# Patient Record
Sex: Male | Born: 1965 | Race: Asian | Hispanic: No | Marital: Married | Smoking: Never smoker
Health system: Southern US, Community
[De-identification: ages and names within clinical notes are randomized; demographics above are authoritative.]

## PROBLEM LIST (undated history)

## (undated) DIAGNOSIS — E039 Hypothyroidism, unspecified: Secondary | ICD-10-CM

## (undated) DIAGNOSIS — R7303 Prediabetes: Secondary | ICD-10-CM

## (undated) DIAGNOSIS — G4730 Sleep apnea, unspecified: Secondary | ICD-10-CM

## (undated) DIAGNOSIS — K219 Gastro-esophageal reflux disease without esophagitis: Secondary | ICD-10-CM

## (undated) DIAGNOSIS — I10 Essential (primary) hypertension: Secondary | ICD-10-CM

## (undated) DIAGNOSIS — F32A Depression, unspecified: Secondary | ICD-10-CM

## (undated) DIAGNOSIS — F419 Anxiety disorder, unspecified: Secondary | ICD-10-CM

## (undated) DIAGNOSIS — N189 Chronic kidney disease, unspecified: Secondary | ICD-10-CM

## (undated) DIAGNOSIS — M109 Gout, unspecified: Secondary | ICD-10-CM

## (undated) DIAGNOSIS — D649 Anemia, unspecified: Secondary | ICD-10-CM

## (undated) DIAGNOSIS — M7542 Impingement syndrome of left shoulder: Principal | ICD-10-CM

## (undated) DIAGNOSIS — M1990 Unspecified osteoarthritis, unspecified site: Secondary | ICD-10-CM

## (undated) DIAGNOSIS — M19072 Primary osteoarthritis, left ankle and foot: Secondary | ICD-10-CM

## (undated) DIAGNOSIS — R011 Cardiac murmur, unspecified: Secondary | ICD-10-CM

## (undated) DIAGNOSIS — M19071 Primary osteoarthritis, right ankle and foot: Secondary | ICD-10-CM

## (undated) DIAGNOSIS — M2240 Chondromalacia patellae, unspecified knee: Secondary | ICD-10-CM

## (undated) DIAGNOSIS — J189 Pneumonia, unspecified organism: Secondary | ICD-10-CM

## (undated) DIAGNOSIS — L309 Dermatitis, unspecified: Secondary | ICD-10-CM

## (undated) DIAGNOSIS — M170 Bilateral primary osteoarthritis of knee: Secondary | ICD-10-CM

## (undated) DIAGNOSIS — N028 Recurrent and persistent hematuria with other morphologic changes: Secondary | ICD-10-CM

## (undated) HISTORY — DX: Gout, unspecified: M10.9

## (undated) HISTORY — DX: Chronic kidney disease, unspecified: N18.9

## (undated) HISTORY — DX: Primary osteoarthritis, right ankle and foot: M19.071

## (undated) HISTORY — PX: HEMORRHOID SURGERY: SHX153

## (undated) HISTORY — DX: Essential (primary) hypertension: I10

## (undated) HISTORY — DX: Bilateral primary osteoarthritis of knee: M17.0

## (undated) HISTORY — DX: Impingement syndrome of left shoulder: M75.42

## (undated) HISTORY — DX: Primary osteoarthritis, left ankle and foot: M19.072

## (undated) HISTORY — PX: KIDNEY TRANSPLANT: SHX239

## (undated) HISTORY — DX: Chondromalacia patellae, unspecified knee: M22.40

## (undated) HISTORY — PX: SHOULDER SURGERY: SHX246

## (undated) HISTORY — DX: Recurrent and persistent hematuria with other morphologic changes: N02.8

## (undated) HISTORY — PX: CATARACT EXTRACTION: SUR2

## (undated) HISTORY — PX: RENAL BIOPSY: SHX156

## (undated) HISTORY — PX: UPPER GI ENDOSCOPY: SHX6162

---

## 1998-12-11 ENCOUNTER — Other Ambulatory Visit: Admission: RE | Admit: 1998-12-11 | Discharge: 1998-12-11 | Payer: Self-pay | Admitting: Urology

## 1999-01-29 ENCOUNTER — Encounter: Payer: Self-pay | Admitting: Internal Medicine

## 2004-08-20 ENCOUNTER — Encounter: Admission: RE | Admit: 2004-08-20 | Discharge: 2004-08-20 | Payer: Self-pay | Admitting: Nephrology

## 2004-09-11 ENCOUNTER — Ambulatory Visit (HOSPITAL_COMMUNITY): Admission: RE | Admit: 2004-09-11 | Discharge: 2004-09-11 | Payer: Self-pay | Admitting: Nephrology

## 2004-09-13 ENCOUNTER — Ambulatory Visit (HOSPITAL_COMMUNITY): Admission: RE | Admit: 2004-09-13 | Discharge: 2004-09-14 | Payer: Self-pay | Admitting: Nephrology

## 2004-09-13 ENCOUNTER — Encounter (INDEPENDENT_AMBULATORY_CARE_PROVIDER_SITE_OTHER): Payer: Self-pay | Admitting: *Deleted

## 2005-04-29 ENCOUNTER — Encounter (HOSPITAL_COMMUNITY): Admission: RE | Admit: 2005-04-29 | Discharge: 2005-07-28 | Payer: Self-pay | Admitting: Nephrology

## 2005-08-30 ENCOUNTER — Encounter (HOSPITAL_COMMUNITY): Admission: RE | Admit: 2005-08-30 | Discharge: 2005-10-02 | Payer: Self-pay | Admitting: Nephrology

## 2006-12-08 ENCOUNTER — Encounter (HOSPITAL_COMMUNITY): Admission: RE | Admit: 2006-12-08 | Discharge: 2007-01-23 | Payer: Self-pay | Admitting: Nephrology

## 2006-12-22 ENCOUNTER — Ambulatory Visit: Payer: Self-pay | Admitting: Internal Medicine

## 2007-01-14 DIAGNOSIS — R1319 Other dysphagia: Secondary | ICD-10-CM | POA: Insufficient documentation

## 2007-01-14 DIAGNOSIS — K2940 Chronic atrophic gastritis without bleeding: Secondary | ICD-10-CM | POA: Insufficient documentation

## 2007-01-14 DIAGNOSIS — E039 Hypothyroidism, unspecified: Secondary | ICD-10-CM | POA: Insufficient documentation

## 2007-01-14 DIAGNOSIS — M109 Gout, unspecified: Secondary | ICD-10-CM | POA: Insufficient documentation

## 2007-01-14 DIAGNOSIS — N059 Unspecified nephritic syndrome with unspecified morphologic changes: Secondary | ICD-10-CM | POA: Insufficient documentation

## 2007-01-14 DIAGNOSIS — I10 Essential (primary) hypertension: Secondary | ICD-10-CM | POA: Insufficient documentation

## 2007-01-14 DIAGNOSIS — Z8711 Personal history of peptic ulcer disease: Secondary | ICD-10-CM | POA: Insufficient documentation

## 2007-01-14 DIAGNOSIS — R197 Diarrhea, unspecified: Secondary | ICD-10-CM | POA: Insufficient documentation

## 2007-01-19 ENCOUNTER — Other Ambulatory Visit: Payer: Self-pay | Admitting: Gastroenterology

## 2009-04-12 ENCOUNTER — Ambulatory Visit: Payer: Self-pay | Admitting: Vascular Surgery

## 2009-06-01 ENCOUNTER — Encounter (HOSPITAL_COMMUNITY): Admission: RE | Admit: 2009-06-01 | Discharge: 2009-08-30 | Payer: Self-pay | Admitting: Nephrology

## 2010-05-07 ENCOUNTER — Other Ambulatory Visit: Payer: Self-pay | Admitting: Orthopedic Surgery

## 2010-05-07 ENCOUNTER — Other Ambulatory Visit (HOSPITAL_COMMUNITY): Payer: Self-pay | Admitting: Orthopedic Surgery

## 2010-05-07 ENCOUNTER — Encounter (HOSPITAL_COMMUNITY): Payer: 59

## 2010-05-07 ENCOUNTER — Ambulatory Visit (HOSPITAL_COMMUNITY)
Admission: RE | Admit: 2010-05-07 | Discharge: 2010-05-07 | Disposition: A | Discharge status: Home / Self Care | Payer: 59 | Source: Ambulatory Visit | Attending: Orthopedic Surgery | Admitting: Orthopedic Surgery

## 2010-05-07 DIAGNOSIS — Z01810 Encounter for preprocedural cardiovascular examination: Secondary | ICD-10-CM | POA: Insufficient documentation

## 2010-05-07 DIAGNOSIS — Z01812 Encounter for preprocedural laboratory examination: Secondary | ICD-10-CM | POA: Insufficient documentation

## 2010-05-07 DIAGNOSIS — Z01818 Encounter for other preprocedural examination: Secondary | ICD-10-CM | POA: Insufficient documentation

## 2010-05-07 LAB — BASIC METABOLIC PANEL
BUN: 47 mg/dL — ABNORMAL HIGH (ref 6–23)
Creatinine, Ser: 3.53 mg/dL — ABNORMAL HIGH (ref 0.4–1.5)
GFR calc Af Amer: 23 mL/min — ABNORMAL LOW (ref >60)
Glucose, Bld: 124 mg/dL — ABNORMAL HIGH (ref 70–99)
Potassium: 4.3 mEq/L (ref 3.5–5.1)

## 2010-05-07 LAB — SURGICAL PCR SCREEN: Staphylococcus aureus: POSITIVE — AB

## 2010-05-07 LAB — CBC
Hemoglobin: 13.1 g/dL (ref 13.0–17.0)
RBC: 4.5 MIL/uL (ref 4.22–5.81)
RDW: 12.2 % (ref 11.5–15.5)

## 2010-05-09 ENCOUNTER — Ambulatory Visit (HOSPITAL_COMMUNITY)
Admission: RE | Admit: 2010-05-09 | Discharge: 2010-05-09 | Disposition: A | Discharge status: Home / Self Care | Payer: 59 | Source: Ambulatory Visit | Attending: Orthopedic Surgery | Admitting: Orthopedic Surgery

## 2010-05-09 ENCOUNTER — Ambulatory Visit (HOSPITAL_BASED_OUTPATIENT_CLINIC_OR_DEPARTMENT_OTHER): Admission: RE | Admit: 2010-05-09 | Payer: 59 | Source: Ambulatory Visit | Admitting: Orthopedic Surgery

## 2010-05-09 DIAGNOSIS — M67919 Unspecified disorder of synovium and tendon, unspecified shoulder: Secondary | ICD-10-CM | POA: Insufficient documentation

## 2010-05-09 DIAGNOSIS — Z01812 Encounter for preprocedural laboratory examination: Secondary | ICD-10-CM | POA: Insufficient documentation

## 2010-05-09 DIAGNOSIS — M719 Bursopathy, unspecified: Secondary | ICD-10-CM | POA: Insufficient documentation

## 2010-05-09 DIAGNOSIS — M25819 Other specified joint disorders, unspecified shoulder: Secondary | ICD-10-CM | POA: Insufficient documentation

## 2010-05-09 DIAGNOSIS — I10 Essential (primary) hypertension: Secondary | ICD-10-CM | POA: Insufficient documentation

## 2010-05-09 DIAGNOSIS — M24119 Other articular cartilage disorders, unspecified shoulder: Secondary | ICD-10-CM | POA: Insufficient documentation

## 2010-05-09 DIAGNOSIS — M9420 Chondromalacia, unspecified site: Secondary | ICD-10-CM | POA: Insufficient documentation

## 2010-05-09 LAB — GLUCOSE, CAPILLARY: Glucose-Capillary: 104 mg/dL — ABNORMAL HIGH (ref 70–99)

## 2010-05-15 NOTE — Op Note (Signed)
  NAMEBALAM, Colin Mcfarland NO.:  [id number]  MEDICAL RECORD NO.:  [id number]           PATIENT TYPE:  O  LOCATION:  DAYL                         FACILITY:  [facility]  PHYSICIAN:  Ugochukwu Chichester L. Rendall, M.D.  DATE OF BIRTH:  01-Nov-1965  DATE OF PROCEDURE:  05/09/2010 DATE OF DISCHARGE:                              OPERATIVE REPORT   PREOPERATIVE DIAGNOSIS:  Impingement syndrome, right shoulder, with frayed rotator cuff tendons and degenerative labrum.  POSTOPERATIVE DIAGNOSIS:  Impingement syndrome, right shoulder, with frayed rotator cuff tendons and degenerative labrum.  SURGICAL PROCEDURES: 1. Arthroscopic glenohumeral debridement of labrum and rotator cuff. 2. Arthroscopic subacromial decompression with bursectomy and     clavicular planing.  SURGEON:  Kieron Kantner L. Rendall, MD  ASSISTANT:  Vonita Moss. Duffy, PAC  ANESTHESIA:  General with the shoulder block.  PROCEDURE:  Under general anesthesia with the shoulder block, the patient is placed in the left lateral decubitus position on the bean bag and the right arm was suspended by the fishing pole shoulder holder with 15 pounds at first and then 10 pounds after 2-3 minutes.  Anatomical landmarks were marked out.  The glenohumeral joint is entered posteriorly.  Significant fraying is found of the labrum and subscapularis and inferior attachment of the supraspinatus at the greater tuberosity.  There was also an area of significant chondromalacia of the superior glenoid posteriorly.  Using the shaver placed in the shoulder by Wissinger rod technique, debridement of these frayed structures was performed and a local chondroplasty of the glenoid.  Once this was completed, attention was turned to the bursa.  A very thick bursa was encountered.  It was almost like a blanket covering the outside of the rotator cuff.  This was debrided with [employer] wand and shaver.  Once it was removed, the anatomical landmark of  the anterolateral acromion was marked by an 18 gauge needle.  The [facility] joint was clearly identified.  Spurs at the anterolateral acromion and over the [facility] joint were seen.  After debriding periosteum with an [employer] wand and getting a very clear picture, 6 mm bur was used to take down the spurs at the anterolateral and anterior acromion and to take down spurs along the [facility] joint.  The joint itself did not appear arthritic enough for a full distal clavicle resection, just the takedown of the spurs.  At this point with a bursectomy complete and the acromioplasty appropriate, traction was let off and it appeared sufficient and a substantial decompression of the subacromial space was complete.  The case was terminated at approximately 40 minutes.  Punctures were closed with 3-0 nylon.  Sterile bandage and arm sling were applied, and the patient returned to recovery in good condition.     Faigy Stretch L. Telford Nab, M.D.     Judie Grieve  D:  05/09/2010  T:  05/09/2010  Job:  [id number]  Electronically Signed by Oretha Caprice M.D. on 05/15/2010 01:08:11 PM

## 2010-05-29 NOTE — Procedures (Signed)
CEPHALIC VEIN MAPPING   INDICATION:  Evaluation for arteriovenous fistula placement.   HISTORY:  Renal failure.   EXAM:  The right cephalic vein is compressible.   Diameter measurements range from 0.19 to 0.41 cm.   The right basilic vein is compressible.   Diameter measurements range from 0.32 to 0.29 cm.   The left cephalic vein is compressible.   Diameter measurements range from 0.18 to 0.49 cm.   The left basilic vein is compressible with diameter measurements ranging  from 0.20 to 0.18 cm.   See attached worksheet for all measurements.   IMPRESSION:  The patient's bilateral basilic and cephalic veins are of  questionable diameter for use as dialysis access sites.   ___________________________________________  Jessy Oto Fields, MD   CJ/MEDQ  D:  04/12/2009  T:  04/12/2009  Job:  [id number]

## 2010-05-29 NOTE — Assessment & Plan Note (Signed)
OFFICE VISIT   Colin Mcfarland, Colin Mcfarland  DOB:  February 17, 1965                                       04/12/2009  CHART#:[id number]   CHIEF COMPLAINT:  Needs hemodialysis access.   HISTORY OF PRESENT ILLNESS:  The patient is a 45 year old male referred  by Dr. Marval Regal for evaluation of placement of hemodialysis access.  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  He is right-handed.  He is currently not on dialysis.  He  has no prior episodes of congestive failure.  He has no significant  lower extremity swelling.  He has no skin itching or other signs  associated with uremia.   CHRONIC MEDICAL PROBLEMS:  Include his IgA nephropathy, gout,  hypothyroidism, reflux and hypertension.  These are all currently well-  controlled and followed by Dr. Dagmar Hait.   PAST MEDICAL HISTORY:  Is otherwise unremarkable.   PAST SURGICAL HISTORY:  None.   FAMILY HISTORY:  None.   SOCIAL HISTORY:  He is married.  He has one child.  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  He does not smoke or consume alcohol.   REVIEW OF SYSTEMS:  A 12 point review of systems was performed with the  patient today.  Please see intake referral form for details regarding  this.   MEDICATIONS:  Include omeprazole, labetalol, Omacor, Micardis,  Synthroid, colchicine p.r.n., allopurinol 100 mg once a day.   ALLERGIES:  He has allergies listed to sulfa and calcitriol.   PHYSICAL EXAM:  Vital signs:  Blood pressure is 113/77 in the left arm,  heart rate 75 and regular.  Temperature is 98.1.  HEENT:  Unremarkable.  Neck:  Has 2+ carotid pulses without bruit.  Chest:  Clear to  auscultation.  Cardiac:  Regular rate and rhythm without murmur.  Abdomen:  Soft, nontender, nondistended.  No masses.  Extremities:  He  has 2+ brachial and radial pulses bilaterally.  He has a palpable  cephalic vein at the wrist bilaterally although this is diminutive more  in the forearm.  He  also has a palpable cephalic vein at the antecubital  region.  Lower extremities:  Have no significant edema.  Musculoskeletal:  Exam shows no obvious major joint deformities.  Neurologic:  Exam shows symmetric upper extremity and lower extremity  motor strength which is 5/5 and symmetric.  Skin:  Has no open ulcers or  rashes.   He had a bilateral upper extremity vein mapping ultrasound today.  This  showed that the cephalic vein in the forearm bilaterally was fairly  small in character, less than 2 mm in diameter.  The upper arm cephalic  vein was between 30 and 50 mm in diameter on the left side and between  30 and 50 mm in diameter on the right side.  The basilic vein was more  diminutive on the left.  There was a basilic vein on the right side  approximately 30 mm in diameter.   I had a lengthy discussion with the patient regarding different types of  access today and how these accesses function.  I described to him that I  felt the best option for him initially would be a left brachiocephalic  AV fistula.  Risks, benefits, possible complications and procedure  details including but not limited to bleeding, infection, ischemic  steal,  nonmaturation of the fistula were explained to the patient today.  He currently wishes to think about this for awhile before scheduling his  fistula.  I told him he could call our office back at any time and the  fistula could be scheduled at his convenience.  Otherwise he will follow  up on an as-needed basis.     Jessy Oto. Fields, MD  Electronically Signed   CEF/MEDQ  D:  04/12/2009  T:  04/13/2009  Job:  [id number]   cc:   Donato Heinz, M.D.  Berneta Sages, M.D.  Dr Art Derald Macleod, M.D.

## 2010-05-29 NOTE — Assessment & Plan Note (Signed)
[facility] OFFICE NOTE   Colin Mcfarland, Colin Mcfarland                           MRN:          [id number]  DATE:12/22/2006                            DOB:          [birth year]    REASON FOR EVALUATION:  Diarrhea.   HISTORY:  This is a 45 year old [place] male with a history of IGA  nephropathy, hypertension, hypothyroidism, and gout.  He presents today  regarding recent problems with diarrhea.  The patient was evaluated in  this office in January of 2001 for reflux and dysphagia.  He underwent  upper endoscopy which revealed chronic gastritis.  Biopsies for H.  pylori were positive.  He was treated with Prev pack.  The patient has  not been seen since.  He reports to me generally having normal bowel  movements.  In early November he developed high volume frequent  diarrhea.  Initially symptoms were unrelated to meals.  Thereafter his  diarrhea seemed to be exacerbated by meals.  He talked to a  gastroenterologist friend who told him to remove milk from his diet.  Around Thanksgiving his diarrhea resolved.  Since that time he has had  normal bowel movements without recurrence.  He does tell me that he had  antibiotics for about 10 days in October for an infected tooth.  He did  have some transient weight loss.  He denies bleeding or other issues at  this time.   PAST MEDICAL HISTORY:  As above.   PAST SURGICAL HISTORY:  None.   ALLERGIES:  SULFA.   CURRENT MEDICATIONS:  1. Labetalol 200 mg q.i.d.  2. Micardis 20/80 mg daily.  3. Sodium bicarbonate t.i.d.  4. Fish oil.  5. Synthroid 125 mcg daily.  6. Tricor 145 mg daily.  7. Allopurinol, unspecified dosages daily.  8. He also uses Nexium p.r.n. for reflux.   FAMILY HISTORY:  Negative for gastrointestinal malignancy or  inflammatory bowel disease.  Brother and father with diabetes.   SOCIAL HISTORY:  The patient is married with one daughter.  He has a  master's  degree.  He works as a Financial planner.  He does not smoke,  rarely uses alcohol.   REVIEW OF SYSTEMS:  Per diagnostic evaluation form.   PHYSICAL EXAMINATION:  Well-appearing male.  No acute distress.  Blood pressure 120/80, heart rate 60, weight is 173 pounds.  He is 5  feet 8 inches in height.  HEENT:  Sclerae anicteric.  Conjunctivae are pink.  Oral mucosa is  intact.  NECK:  No adenopathy.  LUNGS:  Clear.  HEART:  Regular.  ABDOMEN:  Soft without tenderness, mass, or hernia.   IMPRESSION:  A 45 year old with acute diarrheal illness which has  resolved.  I suspect he had a self limited viral gastroenteritis.  Another possibility is antibiotic-associated diarrhea which again has  resolved.  At this point expectant management.     Docia Chuck. Henrene Pastor, MD  Electronically Signed    JNP/MedQ  DD: 12/22/2006  DT: 12/23/2006  Job #: [id number]   cc:   Berneta Sages, M.D.  Donato Heinz,  M.D. 

## 2010-06-01 NOTE — Discharge Summary (Signed)
NAMEALDINE, GORNIK NO.:  [id number]   MEDICAL RECORD NO.:  [id number]          PATIENT TYPE:  OIB   LOCATION:  [id number]                         FACILITY:  [facility]   PHYSICIAN:  Donato Heinz, M.D.DATE OF BIRTH:  08-19-1965   DATE OF ADMISSION:  09/13/2004  DATE OF DISCHARGE:  09/14/2004                                 DISCHARGE SUMMARY   HISTORY OF PRESENT ILLNESS AND HOSPITAL COURSE:  Mr. Winham is a 45 year old  [place] male who was referred to our practice for progressive renal  insufficiency with rising creatinine from 1.6 on January 30, 2004 to 2.7  over a six-month period.  He had nephrotic range proteinuria and was  admitted for 24-hour observation for renal biopsy.  Renal biopsy was  performed on September 13, 2004.  He was kept overnight in observation.  His  vital signs remained stable, and his hemoglobin also remained stable at  10.9.  He is stable for discharge today.   DISCHARGE MEDICATIONS:  1.  Labetalol 300 mg b.i.d.  2.  Omacor fish oil 1 g t.i.d.  3.  Nexium 40 mg daily.  4.  Synthroid 112 mcg daily.  5.  He was given a prescription for Darvocet-N 100 q.6h. p.r.n. pain.   RESTRICTIONS:  No heavy lifting or vigorous activity for two weeks.   FOLLOW UP:  The patient will follow up in our office in the next two to  three weeks and will contact him by phone by Wednesday with biopsy results.   CONDITION ON DISCHARGE:  The patient was discharged in stable condition.           ______________________________  Donato Heinz, M.D.     JC/MEDQ  D:  09/14/2004  T:  09/14/2004  Job:  [id number]

## 2010-10-04 LAB — URINALYSIS, ROUTINE W REFLEX MICROSCOPIC
Nitrite: NEGATIVE
Protein, ur: NEGATIVE
Urobilinogen, UA: 0.2
pH: 6.5

## 2010-10-04 LAB — DIFFERENTIAL
Basophils Absolute: 0.2 — ABNORMAL HIGH
Eosinophils Absolute: 0
Lymphs Abs: 1.3
Monocytes Absolute: 0.5
Monocytes Relative: 4
Neutrophils Relative %: 82 — ABNORMAL HIGH

## 2010-10-04 LAB — CBC
Hemoglobin: 14
MCV: 89.1
RBC: 4.55
RDW: 13.2

## 2010-10-04 LAB — SEDIMENTATION RATE

## 2011-06-28 ENCOUNTER — Other Ambulatory Visit: Payer: Self-pay | Admitting: Dermatology

## 2012-02-18 ENCOUNTER — Other Ambulatory Visit: Payer: Self-pay | Admitting: *Deleted

## 2012-02-18 DIAGNOSIS — N184 Chronic kidney disease, stage 4 (severe): Secondary | ICD-10-CM

## 2012-02-18 DIAGNOSIS — Z01810 Encounter for preprocedural cardiovascular examination: Secondary | ICD-10-CM

## 2012-02-24 ENCOUNTER — Encounter: Payer: Self-pay | Admitting: Vascular Surgery

## 2012-02-25 ENCOUNTER — Encounter (INDEPENDENT_AMBULATORY_CARE_PROVIDER_SITE_OTHER): Payer: 59 | Admitting: *Deleted

## 2012-02-25 ENCOUNTER — Encounter: Payer: Self-pay | Admitting: Vascular Surgery

## 2012-02-25 ENCOUNTER — Ambulatory Visit (INDEPENDENT_AMBULATORY_CARE_PROVIDER_SITE_OTHER): Payer: 59 | Admitting: Vascular Surgery

## 2012-02-25 VITALS — BP 108/69 | HR 64 | Resp 18 | Ht 68.0 in | Wt 161.0 lb

## 2012-02-25 DIAGNOSIS — N186 End stage renal disease: Secondary | ICD-10-CM

## 2012-02-25 DIAGNOSIS — Z01810 Encounter for preprocedural cardiovascular examination: Secondary | ICD-10-CM

## 2012-02-25 DIAGNOSIS — N184 Chronic kidney disease, stage 4 (severe): Secondary | ICD-10-CM

## 2012-02-25 NOTE — Progress Notes (Signed)
Subjective:     Patient ID: Colin Mcfarland, male   DOB: 30-May-1965, 47 y.o.   MRN: [id number]  HPI this 47 year old male is evaluated today for vascular access. He was seen previously almost 3 years ago by Dr. Oneida Alar and evaluated for AV fistula. Since that time he has not required hemodialysis and no access has been performed. He is on the transplant list. He is right-handed.  Past Medical History  Diagnosis Date  . Hypertension   . Chronic kidney disease     History  Substance Use Topics  . Smoking status: Never Smoker   . Smokeless tobacco: Never Used  . Alcohol Use: Yes    Family History  Problem Relation Age of Onset  . Diabetes Father   . Hypertension Father   . Diabetes Brother     Allergies  Allergen Reactions  . Sulfa Antibiotics   . Uloric (Febuxostat)     Current outpatient prescriptions:allopurinol (ZYLOPRIM) 100 MG tablet, Take 100 mg by mouth daily., Disp: , Rfl: ;  labetalol (NORMODYNE) 200 MG tablet, Take 200 mg by mouth 3 (three) times daily., Disp: , Rfl: ;  lansoprazole (PREVACID) 30 MG capsule, Take 30 mg by mouth daily., Disp: , Rfl: ;  levothyroxine (SYNTHROID, LEVOTHROID) 125 MCG tablet, Take 125 mcg by mouth daily., Disp: , Rfl:  LOSARTAN POTASSIUM-HCTZ PO, Take by mouth daily., Disp: , Rfl: ;  omega-3 acid ethyl esters (LOVAZA) 1 G capsule, Take 2 g by mouth 2 (two) times daily., Disp: , Rfl:   BP 108/69  Pulse 64  Resp 18  Ht 5\' 8"  (1.727 m)  Wt 161 lb (73.029 kg)  BMI 24.49 kg/m2  Body mass index is 24.49 kg/(m^2).           Review of Systems Denies chest pain, dyspnea on exertion, PND, orthopnea, hemoptysis, and      Objective:   Physical Examblood pressure 108/69 heart rate 64 respirations 18 General well-developed well-nourished male no apparent stress alert and oriented x3 Lungs no rhonchi or wheezing Cardiovascular regular rhythm no murmurs Both upper extremities were examined and both have 3+ brachial and radial pulses. Cephalic  veins in the upper arms do appear adequate for fistula creation  Today I ordered bilateral vein mapping which revealed the forearm cephalic veins to be too small and thickened. Right upper arm cephalic vein is slightly better than left upper arm. On physical exam it appears left upper arm cephalic vein may be adequate and patient is right-handed     Assessment:     Patient needs left brachial cephalic AV fistula Patient tolerated to schedule this yet he will be in touch with [place]     Plan:     Plan patient would like to discuss this with Dr.: Servando Salina and will be in touch with [place] when he is ready to schedule procedure

## 2012-06-09 ENCOUNTER — Encounter: Payer: Self-pay | Admitting: Nephrology

## 2012-08-06 ENCOUNTER — Encounter: Payer: Self-pay | Admitting: Nephrology

## 2013-01-14 HISTORY — PX: SHOULDER SURGERY: SHX246

## 2013-02-17 ENCOUNTER — Other Ambulatory Visit: Payer: Self-pay | Admitting: *Deleted

## 2013-02-17 DIAGNOSIS — Z01810 Encounter for preprocedural cardiovascular examination: Secondary | ICD-10-CM

## 2013-02-17 DIAGNOSIS — N184 Chronic kidney disease, stage 4 (severe): Secondary | ICD-10-CM

## 2013-03-02 ENCOUNTER — Ambulatory Visit (INDEPENDENT_AMBULATORY_CARE_PROVIDER_SITE_OTHER): Payer: 59 | Admitting: Vascular Surgery

## 2013-03-02 ENCOUNTER — Ambulatory Visit (INDEPENDENT_AMBULATORY_CARE_PROVIDER_SITE_OTHER)
Admission: RE | Admit: 2013-03-02 | Discharge: 2013-03-02 | Disposition: A | Discharge status: Home / Self Care | Payer: 59 | Source: Ambulatory Visit | Attending: Vascular Surgery | Admitting: Vascular Surgery

## 2013-03-02 ENCOUNTER — Encounter (HOSPITAL_COMMUNITY): Payer: Self-pay | Admitting: Pharmacy Technician

## 2013-03-02 ENCOUNTER — Encounter: Payer: Self-pay | Admitting: Vascular Surgery

## 2013-03-02 ENCOUNTER — Ambulatory Visit (HOSPITAL_COMMUNITY)
Admission: RE | Admit: 2013-03-02 | Discharge: 2013-03-02 | Disposition: A | Discharge status: Home / Self Care | Payer: 59 | Source: Ambulatory Visit | Attending: Vascular Surgery | Admitting: Vascular Surgery

## 2013-03-02 ENCOUNTER — Other Ambulatory Visit: Payer: Self-pay | Admitting: *Deleted

## 2013-03-02 ENCOUNTER — Encounter: Payer: Self-pay | Admitting: *Deleted

## 2013-03-02 ENCOUNTER — Encounter (INDEPENDENT_AMBULATORY_CARE_PROVIDER_SITE_OTHER): Payer: Self-pay

## 2013-03-02 VITALS — BP 123/80 | HR 70 | Ht 68.0 in | Wt 168.5 lb

## 2013-03-02 DIAGNOSIS — Z01818 Encounter for other preprocedural examination: Secondary | ICD-10-CM | POA: Insufficient documentation

## 2013-03-02 DIAGNOSIS — Z01810 Encounter for preprocedural cardiovascular examination: Secondary | ICD-10-CM

## 2013-03-02 DIAGNOSIS — N186 End stage renal disease: Secondary | ICD-10-CM

## 2013-03-02 DIAGNOSIS — N184 Chronic kidney disease, stage 4 (severe): Secondary | ICD-10-CM

## 2013-03-02 NOTE — Progress Notes (Signed)
Subjective:     Patient ID: Colin Mcfarland, male   DOB: [birth year], 48 y.o.   MRN: [id number]  HPI this 48 year old male with IgA nephropathy is evaluated for vascular access. He is right-handed. We have evaluated him in the past but his progression of renal insufficiency has been fairly slow. He is now ready for access.  Past Medical History  Diagnosis Date  . Hypertension   . Chronic kidney disease     History  Substance Use Topics  . Smoking status: Never Smoker   . Smokeless tobacco: Never Used  . Alcohol Use: Yes    Family History  Problem Relation Age of Onset  . Diabetes Father   . Hypertension Father   . Diabetes Brother     Allergies  Allergen Reactions  . Sulfa Antibiotics   . Uloric [Febuxostat]     Current outpatient prescriptions:allopurinol (ZYLOPRIM) 100 MG tablet, Take 100 mg by mouth daily., Disp: , Rfl: ;  fenofibrate (TRICOR) 145 MG tablet, Take 1 tablet by mouth daily., Disp: , Rfl: ;  labetalol (NORMODYNE) 200 MG tablet, Take 200 mg by mouth 3 (three) times daily., Disp: , Rfl: ;  lansoprazole (PREVACID) 30 MG capsule, Take 30 mg by mouth daily., Disp: , Rfl:  levothyroxine (SYNTHROID, LEVOTHROID) 125 MCG tablet, Take 125 mcg by mouth daily., Disp: , Rfl: ;  omega-3 acid ethyl esters (LOVAZA) 1 G capsule, Take 2 g by mouth 2 (two) times daily., Disp: , Rfl: ;  LOSARTAN POTASSIUM-HCTZ PO, Take by mouth daily., Disp: , Rfl:   BP 123/80  Pulse 70  Ht 5\' 8"  (1.727 m)  Wt 168 lb 8 oz (76.431 kg)  BMI 25.63 kg/m2  SpO2 100%  Body mass index is 25.63 kg/(m^2).           Review of Systems denies chest pain, dyspnea on exertion, PND, orthopnea, claudication. Has occasional hematuria. Other systems negative and a complete review of systems     Objective:   Physical Exam BP 123/80  Pulse 70  Ht 5\' 8"  (1.727 m)  Wt 168 lb 8 oz (76.431 kg)  BMI 25.63 kg/m2  SpO2 100%  Gen.-alert and oriented x3 in no apparent distress HEENT normal for age Lungs no  rhonchi or wheezing Cardiovascular regular rhythm no murmurs carotid pulses 3+ palpable no bruits audible Abdomen soft nontender no palpable masses Musculoskeletal free of  major deformities Skin clear -no rashes Neurologic normal Lower extremities 3+ femoral and dorsalis pedis pulses palpable bilaterally with no edema Upper extremities examined. Both upper extremities have 3+ brachial and 2+ radial pulses palpable. Upper arm cephalic veins appear adequate bilaterally.  Today I ordered bilateral upper extremity vein mapping and arterial study. The arterial study is normal bilaterally. Cephalic veins in both upper arms appear to be satisfactory for fistula creation.       Assessment:     Chronic kidney disease-stage IV-needs vascular access    Plan:     Plan left brachial-cephalic AV fistula on Thursday, February 19 as an outpatient. Risks and benefits thoroughly discussed with patient including steal syndrome or failure to mature and he would like to proceed as scheduled

## 2013-03-03 MED ORDER — DEXTROSE 5 % IV SOLN
1.5000 g | INTRAVENOUS | Status: AC
  Administered 2013-03-18: 1.5 g via INTRAVENOUS
  Filled 2013-03-03: qty 1.5

## 2013-03-10 ENCOUNTER — Encounter (HOSPITAL_COMMUNITY): Payer: Self-pay | Admitting: *Deleted

## 2013-03-10 NOTE — Progress Notes (Signed)
According to pt, an EKG, stress test and echo was done at [facility] for renal transplant pre-op eval. Pt suggested that [place] may have results of the following as well; a request was made to [facility] for results of studies.

## 2013-03-17 ENCOUNTER — Encounter (HOSPITAL_COMMUNITY): Payer: Self-pay | Admitting: *Deleted

## 2013-03-17 MED ORDER — CHLORHEXIDINE GLUCONATE 4 % EX LIQD
60.0000 mL | Freq: Once | CUTANEOUS | Status: DC
  Filled 2013-03-17: qty 60

## 2013-03-17 MED ORDER — SODIUM CHLORIDE 0.9 % IV SOLN: INTRAVENOUS | Status: DC

## 2013-03-18 ENCOUNTER — Other Ambulatory Visit: Payer: Self-pay | Admitting: *Deleted

## 2013-03-18 ENCOUNTER — Encounter (HOSPITAL_COMMUNITY): Payer: Self-pay | Admitting: Surgery

## 2013-03-18 ENCOUNTER — Telehealth: Payer: Self-pay | Admitting: Vascular Surgery

## 2013-03-18 ENCOUNTER — Encounter (HOSPITAL_COMMUNITY): Payer: 59 | Admitting: Certified Registered Nurse Anesthetist

## 2013-03-18 ENCOUNTER — Ambulatory Visit (HOSPITAL_COMMUNITY): Payer: 59

## 2013-03-18 ENCOUNTER — Encounter (HOSPITAL_COMMUNITY)
Admission: RE | Disposition: A | Discharge status: Home / Self Care | Payer: Self-pay | Source: Ambulatory Visit | Attending: Vascular Surgery

## 2013-03-18 ENCOUNTER — Other Ambulatory Visit: Payer: Self-pay

## 2013-03-18 ENCOUNTER — Ambulatory Visit (HOSPITAL_COMMUNITY): Payer: 59 | Admitting: Certified Registered Nurse Anesthetist

## 2013-03-18 ENCOUNTER — Ambulatory Visit (HOSPITAL_COMMUNITY)
Admission: RE | Admit: 2013-03-18 | Discharge: 2013-03-18 | Disposition: A | Discharge status: Home / Self Care | Payer: 59 | Source: Ambulatory Visit | Attending: Vascular Surgery | Admitting: Vascular Surgery

## 2013-03-18 DIAGNOSIS — Z882 Allergy status to sulfonamides status: Secondary | ICD-10-CM | POA: Insufficient documentation

## 2013-03-18 DIAGNOSIS — N186 End stage renal disease: Secondary | ICD-10-CM

## 2013-03-18 DIAGNOSIS — I129 Hypertensive chronic kidney disease with stage 1 through stage 4 chronic kidney disease, or unspecified chronic kidney disease: Secondary | ICD-10-CM | POA: Insufficient documentation

## 2013-03-18 DIAGNOSIS — Z4931 Encounter for adequacy testing for hemodialysis: Secondary | ICD-10-CM

## 2013-03-18 DIAGNOSIS — N184 Chronic kidney disease, stage 4 (severe): Secondary | ICD-10-CM | POA: Insufficient documentation

## 2013-03-18 HISTORY — DX: Dermatitis, unspecified: L30.9

## 2013-03-18 HISTORY — DX: Anemia, unspecified: D64.9

## 2013-03-18 HISTORY — PX: AV FISTULA PLACEMENT: SHX1204

## 2013-03-18 HISTORY — DX: Hypothyroidism, unspecified: E03.9

## 2013-03-18 HISTORY — DX: Gastro-esophageal reflux disease without esophagitis: K21.9

## 2013-03-18 HISTORY — DX: Unspecified osteoarthritis, unspecified site: M19.90

## 2013-03-18 HISTORY — DX: Pneumonia, unspecified organism: J18.9

## 2013-03-18 LAB — POCT I-STAT 4, (NA,K, GLUC, HGB,HCT)
Glucose, Bld: 91 mg/dL (ref 70–99)
HCT: 33 % — ABNORMAL LOW (ref 39.0–52.0)
Hemoglobin: 11.2 g/dL — ABNORMAL LOW (ref 13.0–17.0)
POTASSIUM: 4.3 meq/L (ref 3.7–5.3)
SODIUM: 141 meq/L (ref 137–147)

## 2013-03-18 LAB — GLUCOSE, CAPILLARY: Glucose-Capillary: 101 mg/dL — ABNORMAL HIGH (ref 70–99)

## 2013-03-18 SURGERY — ARTERIOVENOUS (AV) FISTULA CREATION
Anesthesia: Monitor Anesthesia Care | Site: Arm Lower | Laterality: Left

## 2013-03-18 MED ORDER — HYDROMORPHONE HCL PF 1 MG/ML IJ SOLN: 0.2500 mg | INTRAMUSCULAR | Status: DC | PRN

## 2013-03-18 MED ORDER — PROPOFOL INFUSION 10 MG/ML OPTIME
INTRAVENOUS | Status: DC | PRN
  Administered 2013-03-18: 10 ug/kg/min via INTRAVENOUS

## 2013-03-18 MED ORDER — LIDOCAINE HCL (CARDIAC) 20 MG/ML IV SOLN
INTRAVENOUS | Status: AC
  Filled 2013-03-18: qty 5

## 2013-03-18 MED ORDER — FENTANYL CITRATE 0.05 MG/ML IJ SOLN
INTRAMUSCULAR | Status: AC
  Filled 2013-03-18: qty 5

## 2013-03-18 MED ORDER — SODIUM CHLORIDE 0.9 % IR SOLN
Status: DC | PRN
  Administered 2013-03-18: 08:00:00

## 2013-03-18 MED ORDER — PROPOFOL 10 MG/ML IV BOLUS
INTRAVENOUS | Status: AC
  Filled 2013-03-18: qty 20

## 2013-03-18 MED ORDER — STERILE WATER FOR IRRIGATION IR SOLN
Status: DC | PRN
  Administered 2013-03-18: 1000 mL

## 2013-03-18 MED ORDER — LIDOCAINE-EPINEPHRINE (PF) 1 %-1:200000 IJ SOLN
INTRAMUSCULAR | Status: DC | PRN
  Administered 2013-03-18: 30 mL

## 2013-03-18 MED ORDER — MIDAZOLAM HCL 5 MG/5ML IJ SOLN
INTRAMUSCULAR | Status: DC | PRN
  Administered 2013-03-18: 2 mg via INTRAVENOUS

## 2013-03-18 MED ORDER — HYDROCODONE-ACETAMINOPHEN 5-325 MG PO TABS: 1.0000 | ORAL_TABLET | Freq: Four times a day (QID) | ORAL | Status: DC | PRN

## 2013-03-18 MED ORDER — SODIUM CHLORIDE 0.9 % IV SOLN
INTRAVENOUS | Status: DC | PRN
  Administered 2013-03-18: 07:00:00 via INTRAVENOUS

## 2013-03-18 MED ORDER — LIDOCAINE HCL (CARDIAC) 20 MG/ML IV SOLN
INTRAVENOUS | Status: DC | PRN
  Administered 2013-03-18: 20 mg via INTRAVENOUS

## 2013-03-18 MED ORDER — DEXTROSE 5 % IV SOLN
INTRAVENOUS | Status: AC
  Filled 2013-03-18: qty 1.5

## 2013-03-18 MED ORDER — FENTANYL CITRATE 0.05 MG/ML IJ SOLN
INTRAMUSCULAR | Status: DC | PRN
  Administered 2013-03-18 (×2): 50 ug via INTRAVENOUS

## 2013-03-18 MED ORDER — ONDANSETRON HCL 4 MG/2ML IJ SOLN
INTRAMUSCULAR | Status: AC
  Filled 2013-03-18: qty 2

## 2013-03-18 MED ORDER — 0.9 % SODIUM CHLORIDE (POUR BTL) OPTIME
TOPICAL | Status: DC | PRN
  Administered 2013-03-18: 1000 mL

## 2013-03-18 MED ORDER — ONDANSETRON HCL 4 MG/2ML IJ SOLN
INTRAMUSCULAR | Status: DC | PRN
  Administered 2013-03-18: 4 mg via INTRAVENOUS

## 2013-03-18 MED ORDER — MIDAZOLAM HCL 2 MG/2ML IJ SOLN
INTRAMUSCULAR | Status: AC
  Filled 2013-03-18: qty 2

## 2013-03-18 SURGICAL SUPPLY — 34 items
ADH SKN CLS APL DERMABOND .7 (GAUZE/BANDAGES/DRESSINGS) ×2
ARMBAND PINK RESTRICT EXTREMIT (MISCELLANEOUS) ×2 IMPLANT
CANISTER SUCTION 2500CC (MISCELLANEOUS) ×2 IMPLANT
CLIP TI MEDIUM 6 (CLIP) ×2 IMPLANT
CLIP TI WIDE RED SMALL 6 (CLIP) ×4 IMPLANT
COVER PROBE W GEL 5X96 (DRAPES) ×2 IMPLANT
COVER SURGICAL LIGHT HANDLE (MISCELLANEOUS) ×2 IMPLANT
DERMABOND ADVANCED (GAUZE/BANDAGES/DRESSINGS) ×2
DERMABOND ADVANCED .7 DNX12 (GAUZE/BANDAGES/DRESSINGS) ×2 IMPLANT
DRAIN PENROSE 1/2X12 LTX STRL (WOUND CARE) ×2 IMPLANT
ELECT REM PT RETURN 9FT ADLT (ELECTROSURGICAL) ×2
ELECTRODE REM PT RTRN 9FT ADLT (ELECTROSURGICAL) ×1 IMPLANT
GEL ULTRASOUND 20GR AQUASONIC (MISCELLANEOUS) IMPLANT
GLOVE BIO SURGEON STRL SZ 6.5 (GLOVE) ×6 IMPLANT
GLOVE BIOGEL PI IND STRL 7.5 (GLOVE) ×1 IMPLANT
GLOVE BIOGEL PI IND STRL 8 (GLOVE) ×1 IMPLANT
GLOVE BIOGEL PI INDICATOR 7.5 (GLOVE) ×1
GLOVE BIOGEL PI INDICATOR 8 (GLOVE) ×1
GLOVE SS BIOGEL STRL SZ 7 (GLOVE) ×2 IMPLANT
GLOVE SUPERSENSE BIOGEL SZ 7 (GLOVE) ×2
GOWN STRL REUS W/ TWL LRG LVL3 (GOWN DISPOSABLE) ×4 IMPLANT
GOWN STRL REUS W/TWL LRG LVL3 (GOWN DISPOSABLE) ×8
KIT BASIN OR (CUSTOM PROCEDURE TRAY) ×2 IMPLANT
KIT ROOM TURNOVER OR (KITS) ×2 IMPLANT
NS IRRIG 1000ML POUR BTL (IV SOLUTION) ×2 IMPLANT
PACK CV ACCESS (CUSTOM PROCEDURE TRAY) ×2 IMPLANT
PAD ARMBOARD 7.5X6 YLW CONV (MISCELLANEOUS) ×4 IMPLANT
SUT PROLENE 6 0 BV (SUTURE) ×2 IMPLANT
SUT VIC AB 3-0 SH 27 (SUTURE) ×4
SUT VIC AB 3-0 SH 27X BRD (SUTURE) ×2 IMPLANT
TOWEL OR 17X24 6PK STRL BLUE (TOWEL DISPOSABLE) ×2 IMPLANT
TOWEL OR 17X26 10 PK STRL BLUE (TOWEL DISPOSABLE) ×2 IMPLANT
UNDERPAD 30X30 INCONTINENT (UNDERPADS AND DIAPERS) ×2 IMPLANT
WATER STERILE IRR 1000ML POUR (IV SOLUTION) ×2 IMPLANT

## 2013-03-18 NOTE — Transfer of Care (Signed)
Immediate Anesthesia Transfer of Care Note  Patient: Colin Mcfarland  Procedure(s) Performed: Procedure(s): ARTERIOVENOUS (AV) FISTULA CREATION- BRACHIOCEPHALIC (Left)  Patient Location: PACU  Anesthesia Type:MAC  Level of Consciousness: awake, alert , oriented and sedated  Airway & Oxygen Therapy: Patient Spontanous Breathing and Patient connected to nasal cannula oxygen  Post-op Assessment: Report given to PACU RN, Post -op Vital signs reviewed and stable and Patient moving all extremities  Post vital signs: Reviewed and stable  Complications: No apparent anesthesia complications

## 2013-03-18 NOTE — Anesthesia Preprocedure Evaluation (Signed)
Anesthesia Evaluation  Patient identified by MRN, date of birth, ID band Patient awake    Reviewed: Allergy & Precautions, H&P , NPO status , Patient's Chart, lab work & pertinent test results  Airway Mallampati: II      Dental   Pulmonary pneumonia -,          Cardiovascular hypertension,     Neuro/Psych    GI/Hepatic Neg liver ROS, GERD-  ,  Endo/Other  Hypothyroidism   Renal/GU Renal disease     Musculoskeletal   Abdominal   Peds  Hematology  (+) anemia ,   Anesthesia Other Findings   Reproductive/Obstetrics                           Anesthesia Physical Anesthesia Plan  ASA: III  Anesthesia Plan: MAC   Post-op Pain Management:    Induction:   Airway Management Planned: Simple Face Mask  Additional Equipment:   Intra-op Plan:   Post-operative Plan:   Informed Consent: I have reviewed the patients History and Physical, chart, labs and discussed the procedure including the risks, benefits and alternatives for the proposed anesthesia with the patient or authorized representative who has indicated his/her understanding and acceptance.   Dental advisory given  Plan Discussed with: CRNA, Anesthesiologist and Surgeon  Anesthesia Plan Comments:         Anesthesia Quick Evaluation

## 2013-03-18 NOTE — Anesthesia Postprocedure Evaluation (Signed)
  Anesthesia Post-op Note  Patient: Colin Mcfarland  Procedure(s) Performed: Procedure(s): ARTERIOVENOUS (AV) FISTULA CREATION- BRACHIOCEPHALIC (Left)  Patient Location: PACU  Anesthesia Type:General  Level of Consciousness: awake  Airway and Oxygen Therapy: Patient Spontanous Breathing  Post-op Pain: mild  Post-op Assessment: Post-op Vital signs reviewed  Post-op Vital Signs: Reviewed  Complications: No apparent anesthesia complications

## 2013-03-18 NOTE — Progress Notes (Signed)
Patient experienced drop in HR by monitor to 38 after removal of IV catheter.  Patient stated he felt 'strange" was diaphoretic, and pale.  Patient's chair was reclined further, patient reconnected to monitors (had been discontinued as prelude to discharge) and MD notifed.  Dr. Ermalene Postin at bedside, orders received.

## 2013-03-18 NOTE — Telephone Encounter (Addendum)
Message copied by Doristine Section on Thu Mar 18, 2013 11:04 AM ------      Message from: Peter Minium K      Created: Thu Mar 18, 2013  9:52 AM      Regarding: schedule                   ----- Message -----         From: Ulyses Amor, PA-C         Sent: 03/18/2013   9:13 AM           To: [facility] Charge Pool            F/U in 6 weeks for ultrasound s/p AV fistula creation left upper arm. ------ notified patient of post op visit scheduled on 05-18-13 at 1:30 for [place] and the 3:00 to see dr. Kellie Simmering, made pt. aware that there would be a 30 minute wait in between lab and md.

## 2013-03-18 NOTE — Op Note (Signed)
OPERATIVE REPORT  Date of Surgery: 03/18/2013  Surgeon: Tinnie Gens, MD  Assistant: Gerri Lins PA  Pre-op Diagnosis: Chronic kidney disease  Post-op Diagnosis: Chronic kidney disease  Procedure: Procedure(s): #1 exploration of distal left cephalic vein-inadequate for fistula #2 creation of left brachial to cephalic A-V fistula  Anesthesia: MAC  EBL: Minimal  Complications: None  Procedure Details: Patient was taken to the operating room placed in supine position at which time left upper treatment he was prepped with Betadine scrub and solution draped in routine sterile manner. After infiltration with 1% Xylocaine a short longitudinal incision was made just proximal the wrist and the radial artery and cephalic vein. The cephalic vein had been imaged with the ultrasound-sono site intraoperatively and thought to be borderline but possibly adequate. It was dissected free but was felt to be too small for fistula creation. After hemostasis was achieved the wound was then closed in layers with Vicryl in subcuticular fashion and Dermabond attention turned to the antecubital area. After infiltration with 1% Xylocaine with epinephrine transverse incision was made cephalic vein was dissected free. A few branches were ligated with 3-0 silk ties and divided it was ligated distally transected gently dilated with heparinized saline was satisfactory for fistula creation. Brachial artery was exposed and the fascia was Vessel being about 3-3 and half millimeters in size with a good pulse. Artery was occluded proximally and distally with Vesseloops a 15 blade extended with Potts scissors was excellent inflow and backbleeding. Vein was carefully measured spatulated and anastomosed end to side with 6-0 Prolene. Following this the Vesseloops were released there was an excellent pulse and thrill in the fistula. As it imaged with the sono site and there was one competing branch extending medially which was  identified through the surgical wound and ligated with Hemoclip. Adequate hemostasis was achieved wound closed in layers with Vicryl in subcuticular fashion and Dermabond patient to recovery in stable condition   Tinnie Gens, MD 03/18/2013 9:19 AM

## 2013-03-18 NOTE — Preoperative (Signed)
Beta Blockers   Reason not to administer Beta Blockers:Not Applicable 

## 2013-03-18 NOTE — Interval H&P Note (Signed)
History and Physical Interval Note:  03/18/2013 7:36 AM  Colin Mcfarland  has presented today for surgery, with the diagnosis of Chronic kidney disease  The various methods of treatment have been discussed with the patient and family. After consideration of risks, benefits and other options for treatment, the patient has consented to  Procedure(s): ARTERIOVENOUS (AV) FISTULA CREATION- BRACHIOCEPHALIC (Left) as a surgical intervention .  The patient's history has been reviewed, patient examined, no change in status, stable for surgery.  I have reviewed the patient's chart and labs.  Questions were answered to the patient's satisfaction.     Tinnie Gens

## 2013-03-18 NOTE — Discharge Instructions (Signed)

## 2013-03-18 NOTE — Progress Notes (Signed)
Dr. Kellie Simmering at bedside, informed of previous episode.  Ordered to hold patient here for one additional hour to monitor his condition.

## 2013-03-18 NOTE — H&P (View-Only) (Signed)
Subjective:     Patient ID: Colin Mcfarland, male   DOB: 22-Jan-1965, 48 y.o.   MRN: [id number]  HPI this 48 year old male with IgA nephropathy is evaluated for vascular access. He is right-handed. We have evaluated him in the past but his progression of renal insufficiency has been fairly slow. He is now ready for access.  Past Medical History  Diagnosis Date  . Hypertension   . Chronic kidney disease     History  Substance Use Topics  . Smoking status: Never Smoker   . Smokeless tobacco: Never Used  . Alcohol Use: Yes    Family History  Problem Relation Age of Onset  . Diabetes Father   . Hypertension Father   . Diabetes Brother     Allergies  Allergen Reactions  . Sulfa Antibiotics   . Uloric [Febuxostat]     Current outpatient prescriptions:allopurinol (ZYLOPRIM) 100 MG tablet, Take 100 mg by mouth daily., Disp: , Rfl: ;  fenofibrate (TRICOR) 145 MG tablet, Take 1 tablet by mouth daily., Disp: , Rfl: ;  labetalol (NORMODYNE) 200 MG tablet, Take 200 mg by mouth 3 (three) times daily., Disp: , Rfl: ;  lansoprazole (PREVACID) 30 MG capsule, Take 30 mg by mouth daily., Disp: , Rfl:  levothyroxine (SYNTHROID, LEVOTHROID) 125 MCG tablet, Take 125 mcg by mouth daily., Disp: , Rfl: ;  omega-3 acid ethyl esters (LOVAZA) 1 G capsule, Take 2 g by mouth 2 (two) times daily., Disp: , Rfl: ;  LOSARTAN POTASSIUM-HCTZ PO, Take by mouth daily., Disp: , Rfl:   BP 123/80  Pulse 70  Ht 5\' 8"  (1.727 m)  Wt 168 lb 8 oz (76.431 kg)  BMI 25.63 kg/m2  SpO2 100%  Body mass index is 25.63 kg/(m^2).           Review of Systems denies chest pain, dyspnea on exertion, PND, orthopnea, claudication. Has occasional hematuria. Other systems negative and a complete review of systems     Objective:   Physical Exam BP 123/80  Pulse 70  Ht 5\' 8"  (1.727 m)  Wt 168 lb 8 oz (76.431 kg)  BMI 25.63 kg/m2  SpO2 100%  Gen.-alert and oriented x3 in no apparent distress HEENT normal for age Lungs no  rhonchi or wheezing Cardiovascular regular rhythm no murmurs carotid pulses 3+ palpable no bruits audible Abdomen soft nontender no palpable masses Musculoskeletal free of  major deformities Skin clear -no rashes Neurologic normal Lower extremities 3+ femoral and dorsalis pedis pulses palpable bilaterally with no edema Upper extremities examined. Both upper extremities have 3+ brachial and 2+ radial pulses palpable. Upper arm cephalic veins appear adequate bilaterally.  Today I ordered bilateral upper extremity vein mapping and arterial study. The arterial study is normal bilaterally. Cephalic veins in both upper arms appear to be satisfactory for fistula creation.       Assessment:     Chronic kidney disease-stage IV-needs vascular access    Plan:     Plan left brachial-cephalic AV fistula on Thursday, February 19 as an outpatient. Risks and benefits thoroughly discussed with patient including steal syndrome or failure to mature and he would like to proceed as scheduled

## 2013-03-19 ENCOUNTER — Encounter (HOSPITAL_COMMUNITY): Payer: Self-pay | Admitting: Vascular Surgery

## 2013-04-09 ENCOUNTER — Other Ambulatory Visit: Payer: Self-pay | Admitting: *Deleted

## 2013-04-12 ENCOUNTER — Ambulatory Visit (INDEPENDENT_AMBULATORY_CARE_PROVIDER_SITE_OTHER): Payer: 59 | Admitting: Cardiovascular Disease

## 2013-04-12 ENCOUNTER — Encounter: Payer: Self-pay | Admitting: Cardiovascular Disease

## 2013-04-12 VITALS — BP 130/80 | HR 68 | Ht 68.0 in | Wt 167.0 lb

## 2013-04-12 DIAGNOSIS — I498 Other specified cardiac arrhythmias: Secondary | ICD-10-CM

## 2013-04-12 DIAGNOSIS — R55 Syncope and collapse: Secondary | ICD-10-CM

## 2013-04-12 DIAGNOSIS — R011 Cardiac murmur, unspecified: Secondary | ICD-10-CM

## 2013-04-12 DIAGNOSIS — R001 Bradycardia, unspecified: Secondary | ICD-10-CM

## 2013-04-12 DIAGNOSIS — I10 Essential (primary) hypertension: Secondary | ICD-10-CM

## 2013-04-12 NOTE — Assessment & Plan Note (Signed)
Seems to have aortic outflow and MR murmurs  Echo also to r/o pericardial effusion No rub on exam

## 2013-04-12 NOTE — Assessment & Plan Note (Signed)
Well controlled.  Continue current medications and low sodium Dash type diet.   In future may need to change to hydralazine if bradycardia an issue or at least decrease labatolol to bid

## 2013-04-12 NOTE — Progress Notes (Signed)
Patient ID: Colin Mcfarland, male   DOB: 07-02-1965, 48 y.o.   MRN: [id number]  48 yo with CRF recent vascular access surgery.  Referred for bradycardia and near syncope  On 3/5 had LUE fistula placed When his iv was removed at d/c he had sinus bradycardia on monitor with light headedness and diaphoresis.  Resolved spontaneously in recliner.  No previous cardiac disease  On labatolol 3x/day for HTN  CRF with Cr about 6 due to IgA nephropathy.  Still with good urine output.  Is on transplant list at [facility] since 2011.  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  No palpitations or frank syncope  No history of hyperkalemia    ROS: Denies fever, malais, weight loss, blurry vision, decreased visual acuity, cough, sputum, SOB, hemoptysis, pleuritic pain, palpitaitons, heartburn, abdominal pain, melena, lower extremity edema, claudication, or rash.  All other systems reviewed and negative   General: Affect appropriate Healthy:  appears stated age 19: normal Neck supple with no adenopathy JVP normal no bruits no thyromegaly Lungs clear with no wheezing and good diaphragmatic motion Heart:  S1/S2SEM murmur,rub, gallop or click PMI normal Abdomen: benighn, BS positve, no tenderness, no AAA no bruit.  No HSM or HJR Distal pulses intact with failed left radial fistula with thrill in LUE fistula  No edema Neuro non-focal Skin warm and dry No muscular weakness  Medications Current Outpatient Prescriptions  Medication Sig Dispense Refill  . allopurinol (ZYLOPRIM) 100 MG tablet Take 100 mg by mouth at bedtime.       Marland Kitchen amoxicillin (AMOXIL) 500 MG capsule       . fenofibrate (TRICOR) 145 MG tablet Take 145 mg by mouth at bedtime.       . ferrous sulfate 325 (65 FE) MG tablet Take 325 mg by mouth at bedtime.      . fluocinonide cream (LIDEX) [id number] % Apply 1 application topically daily as needed (rash on  feet).      Marland Kitchen HYDROcodone-acetaminophen (NORCO/VICODIN) 5-325 MG per tablet Take 1 tablet by mouth every 6 (six) hours as needed for moderate pain (pain/gout flare ups).  30 tablet  0  . labetalol (NORMODYNE) 200 MG tablet Take 200 mg by mouth 3 (three) times daily.      . lansoprazole (PREVACID) 30 MG capsule Take 30 mg by mouth at bedtime.       Marland Kitchen levothyroxine (SYNTHROID, LEVOTHROID) 125 MCG tablet Take 125 mcg by mouth daily.      Marland Kitchen omega-3 acid ethyl esters (LOVAZA) 1 G capsule Take 2 g by mouth at bedtime.        No current facility-administered medications for this visit.    Allergies Nsaids; Uloric; and Sulfa antibiotics  Family History: Family History  Problem Relation Age of Onset  . Diabetes Father   . Hypertension Father   . Diabetes Brother     Social History: History   Social History  . Marital Status: Married    Spouse Name: N/A    Number of Children: N/A  . Years of Education: N/A   Occupational History  . Not on file.   Social History Main Topics  . Smoking status: Never Smoker   . Smokeless tobacco: Never Used  . Alcohol Use: Yes     Comment: rare  . Drug Use: No  . Sexual Activity: Not on file   Other Topics  Concern  . Not on file   Social History Narrative  . No narrative on file    Electrocardiogram:  SR rate 66 LAE otherwise normal   Assessment and Plan

## 2013-04-12 NOTE — Assessment & Plan Note (Signed)
Sounds like a vagal episode wit propensity to have low HR due to tid labatolol  With transplant list issues will order ETT To r/o CAD and make sure HR/BP response to exercise is normal Hold labatolol night before and morning of ETT

## 2013-04-12 NOTE — Patient Instructions (Signed)

## 2013-04-14 ENCOUNTER — Encounter: Payer: Self-pay | Admitting: Cardiology

## 2013-04-14 ENCOUNTER — Ambulatory Visit (HOSPITAL_COMMUNITY): Payer: 59 | Attending: Cardiology | Admitting: Radiology

## 2013-04-14 DIAGNOSIS — R011 Cardiac murmur, unspecified: Secondary | ICD-10-CM | POA: Insufficient documentation

## 2013-04-14 DIAGNOSIS — R55 Syncope and collapse: Secondary | ICD-10-CM

## 2013-04-14 NOTE — Progress Notes (Signed)
Echocardiogram performed.  

## 2013-04-23 ENCOUNTER — Telehealth: Payer: Self-pay | Admitting: *Deleted

## 2013-04-23 NOTE — Telephone Encounter (Signed)
Normal EF just mild AR and MR ----- Message ----- From: Richmond Campbell, LPN Sent: [id number] PM To: Josue Hector, MD  LEFT MESSAGE FOR PT TO CALL BACK .Adonis Housekeeper

## 2013-05-10 NOTE — Telephone Encounter (Signed)
PT AWARE OF ECHO RESULTS./CY 

## 2013-05-11 ENCOUNTER — Encounter: Payer: 59 | Admitting: Vascular Surgery

## 2013-05-11 ENCOUNTER — Ambulatory Visit (HOSPITAL_COMMUNITY)
Admission: RE | Admit: 2013-05-11 | Discharge: 2013-05-11 | Disposition: A | Discharge status: Home / Self Care | Payer: 59 | Source: Ambulatory Visit | Attending: Vascular Surgery | Admitting: Vascular Surgery

## 2013-05-11 DIAGNOSIS — N186 End stage renal disease: Secondary | ICD-10-CM

## 2013-05-11 DIAGNOSIS — Z4931 Encounter for adequacy testing for hemodialysis: Secondary | ICD-10-CM

## 2013-05-12 ENCOUNTER — Ambulatory Visit (INDEPENDENT_AMBULATORY_CARE_PROVIDER_SITE_OTHER): Payer: 59 | Admitting: Physician Assistant

## 2013-05-12 DIAGNOSIS — I498 Other specified cardiac arrhythmias: Secondary | ICD-10-CM

## 2013-05-12 DIAGNOSIS — R55 Syncope and collapse: Secondary | ICD-10-CM

## 2013-05-12 DIAGNOSIS — R001 Bradycardia, unspecified: Secondary | ICD-10-CM

## 2013-05-12 NOTE — Progress Notes (Signed)
Exercise Treadmill Test  Pre-Exercise Testing Evaluation Rhythm: normal sinus  Rate: 61 bpm     Test  Exercise Tolerance Test Ordering MD: Jenkins Rouge, MD  Interpreting MD: Bonnell Public, [place]  Unique Test No: 1  Treadmill:  1  Indication for ETT: syncope  Contraindication to ETT: No   Stress Modality: exercise - treadmill  Cardiac Imaging Performed: non   Protocol: standard Bruce - maximal  Max BP:  156/61  Max MPHR (bpm):  172 85% MPR (bpm):  146  MPHR obtained (bpm):  148 % MPHR obtained:  85  Reached 85% MPHR (min:sec): 8:44 Total Exercise Time (min-sec):  8:44  Workload in METS:  10.1 Borg Scale: 16  Reason ETT Terminated:  dyspnea    ST Segment Analysis At Rest: normal ST segments - no evidence of significant ST depression With Exercise: no evidence of significant ST depression  Other Information Arrhythmia:  No Angina during ETT:  absent (0) Quality of ETT:  diagnostic  ETT Interpretation:  normal - no evidence of ischemia by ST analysis  Comments: Good exercise tolerance. No chest pain or EKG changes. Took beta blocker 1 am so harder to get HR up.  Recommendations: F/u primary MD

## 2013-05-17 ENCOUNTER — Encounter: Payer: Self-pay | Admitting: Vascular Surgery

## 2013-05-18 ENCOUNTER — Encounter: Payer: Self-pay | Admitting: Vascular Surgery

## 2013-05-18 ENCOUNTER — Ambulatory Visit (INDEPENDENT_AMBULATORY_CARE_PROVIDER_SITE_OTHER): Payer: Self-pay | Admitting: Vascular Surgery

## 2013-05-18 ENCOUNTER — Encounter: Payer: 59 | Admitting: Vascular Surgery

## 2013-05-18 ENCOUNTER — Other Ambulatory Visit (HOSPITAL_COMMUNITY): Payer: 59

## 2013-05-18 VITALS — BP 115/76 | HR 74 | Resp 18 | Ht 68.0 in | Wt 168.6 lb

## 2013-05-18 DIAGNOSIS — N186 End stage renal disease: Secondary | ICD-10-CM

## 2013-05-18 NOTE — Progress Notes (Signed)
Subjective:     Patient ID: Colin Mcfarland, male   DOB: [birth year], 48 y.o.   MRN: [id number]  HPI this 48 year old male with chronic renal insufficiency is not it on hemodialysis. He returns for evaluation of the left brachial mass cephalic AV fistula which I created on 03/19/2013. He denies any pain or numbness in the left hand.   Review of Systems     Objective:   Physical Exam BP 115/76  Pulse 74  Resp 18  Ht 5\' 8"  (1.727 m)  Wt 168 lb 9.6 oz (76.476 kg)  BMI 25.64 kg/m2  General well-developed well-nourished male no apparent stress alert and oriented x3 Left antecubital incision nicely healed. 3+ brachial and 2+ radial pulse palpable. Left hand well perfused. Good pulse and palpable thrill in the upper arm brachial to cephalic AV fistula.  Patient had duplex scan performed 05/11/2013 in our office which reveals no evidence of competing branches and a widely patent brachial to cephalic A-V fistula     Assessment:     Widely patent left brachial to cephalic A-V fistula    Plan:     Okay to use the left arm AV fistula beginning 06/19/2013 Return to see [place] on a when necessary basis

## 2013-07-14 ENCOUNTER — Telehealth: Payer: Self-pay

## 2013-07-14 DIAGNOSIS — M79602 Pain in left arm: Secondary | ICD-10-CM

## 2013-07-14 DIAGNOSIS — T82898A Other specified complication of vascular prosthetic devices, implants and grafts, initial encounter: Secondary | ICD-10-CM

## 2013-07-14 NOTE — Telephone Encounter (Signed)
Message copied by Denman George on Wed Jul 14, 2013  4:53 PM ------      Message from: Garth Bigness P      Created: Wed Jul 14, 2013  1:05 PM      Regarding: Triage       Sir, Rotz left a message with the appointment desk, stating that his fistula that was placed by [facility] a few months ago is now painful. He would like a call back at [phone].            Thanks,      Hinton Dyer ------

## 2013-07-14 NOTE — Telephone Encounter (Signed)
Ret'd phone call to pt.  C/o intermittent aching of left upper arm since last week.  Reported he noticed the inner aspect of the left upper arm AVF site is a little more swollen than the outer aspect.  Reported "minor discoloration of redness " a couple days ago, but it has gone away.  Denies fever/ chills.  Denies any open sore or drainage.  Reported he has not started dialysis, so the AVF hasn't been used.  Discussed w/ Dr. Scot Dock.  Rec'd v.o. for duplex of left arm AVF and office visit next week.

## 2013-07-20 ENCOUNTER — Encounter: Payer: Self-pay | Admitting: Surgery

## 2013-07-21 ENCOUNTER — Ambulatory Visit (INDEPENDENT_AMBULATORY_CARE_PROVIDER_SITE_OTHER): Payer: 59 | Admitting: Surgery

## 2013-07-21 ENCOUNTER — Ambulatory Visit (HOSPITAL_COMMUNITY)
Admission: RE | Admit: 2013-07-21 | Discharge: 2013-07-21 | Disposition: A | Discharge status: Home / Self Care | Payer: 59 | Source: Ambulatory Visit | Attending: Surgery | Admitting: Surgery

## 2013-07-21 ENCOUNTER — Encounter: Payer: Self-pay | Admitting: Surgery

## 2013-07-21 VITALS — BP 114/73 | HR 65 | Ht 68.0 in | Wt 166.0 lb

## 2013-07-21 DIAGNOSIS — Y9210 Unspecified residential institution as the place of occurrence of the external cause: Secondary | ICD-10-CM | POA: Insufficient documentation

## 2013-07-21 DIAGNOSIS — M79609 Pain in unspecified limb: Secondary | ICD-10-CM

## 2013-07-21 DIAGNOSIS — N186 End stage renal disease: Secondary | ICD-10-CM

## 2013-07-21 DIAGNOSIS — M79602 Pain in left arm: Secondary | ICD-10-CM

## 2013-07-21 DIAGNOSIS — T82898A Other specified complication of vascular prosthetic devices, implants and grafts, initial encounter: Secondary | ICD-10-CM

## 2013-07-21 DIAGNOSIS — Y832 Surgical operation with anastomosis, bypass or graft as the cause of abnormal reaction of the patient, or of later complication, without mention of misadventure at the time of the procedure: Secondary | ICD-10-CM | POA: Insufficient documentation

## 2013-07-21 NOTE — Progress Notes (Signed)
Patient name: Colin Mcfarland MRN: [id number] DOB: 10/19/1965 Sex: male     Chief Complaint  Patient presents with  . Re-evaluation    c/o intermittent aching L UA     HISTORY OF PRESENT ILLNESS: Is post left brachiocephalic fistula by Dr. Kellie Simmering and March of 2015.  He comes in with concerns over some pain in his shoulder.  He states that he will occasionally gets pain in his left hand.  He is not yet on dialysis.  Past Medical History  Diagnosis Date  . Hypertension   . Eczema     hx: of  . Hypothyroidism   . Pneumonia   . GERD (gastroesophageal reflux disease)   . Arthritis   . Anemia   . Chronic kidney disease     Past Surgical History  Procedure Laterality Date  . Shoulder surgery    . Renal biopsy    . Hemorrhoid surgery    . Av fistula placement Left 03/18/2013    Procedure: ARTERIOVENOUS (AV) FISTULA CREATION- BRACHIOCEPHALIC;  Surgeon: Mal Misty, MD;  Location: [facility];  Service: Vascular;  Laterality: Left;    History   Social History  . Marital Status: Married    Spouse Name: N/A    Number of Children: N/A  . Years of Education: N/A   Occupational History  . Not on file.   Social History Main Topics  . Smoking status: Never Smoker   . Smokeless tobacco: Never Used  . Alcohol Use: Yes     Comment: rare  . Drug Use: No  . Sexual Activity: Not on file   Other Topics Concern  . Not on file   Social History Narrative  . No narrative on file    Family History  Problem Relation Age of Onset  . Diabetes Father   . Hypertension Father   . Diabetes Brother   . Hypertension Mother     Allergies as of 07/21/2013 - Review Complete 07/21/2013  Allergen Reaction Noted  . Nsaids Other (See Comments) 03/02/2013  . Uloric [febuxostat] Other (See Comments) 02/25/2012  . Sulfa antibiotics Rash 02/25/2012    Current Outpatient Prescriptions on File Prior to Visit  Medication Sig Dispense Refill  . allopurinol (ZYLOPRIM) 100 MG tablet Take 100 mg by  mouth at bedtime.       . fenofibrate (TRICOR) 145 MG tablet Take 145 mg by mouth at bedtime.       . ferrous sulfate 325 (65 FE) MG tablet Take 325 mg by mouth at bedtime.      . fluocinonide cream (LIDEX) [id number] % Apply 1 application topically daily as needed (rash on feet).      Marland Kitchen HYDROcodone-acetaminophen (NORCO/VICODIN) 5-325 MG per tablet Take 1 tablet by mouth every 6 (six) hours as needed for moderate pain (pain/gout flare ups).  30 tablet  0  . labetalol (NORMODYNE) 200 MG tablet Take 200 mg by mouth 3 (three) times daily.      . lansoprazole (PREVACID) 30 MG capsule Take 30 mg by mouth at bedtime.       Marland Kitchen levothyroxine (SYNTHROID, LEVOTHROID) 125 MCG tablet Take 125 mcg by mouth daily.      Marland Kitchen omega-3 acid ethyl esters (LOVAZA) 1 G capsule Take 2 g by mouth at bedtime.        No current facility-administered medications on file prior to visit.     REVIEW OF SYSTEMS: See history of present illness, otherwise all systems negative  PHYSICAL EXAMINATION:   Vital signs are BP 114/73  Pulse 65  Ht 5\' 8"  (1.727 m)  Wt 166 lb (75.297 kg)  BMI 25.25 kg/m2  SpO2 100% General: The patient appears their stated age. HEENT:  No gross abnormalities Pulmonary:  Non labored breathing Musculoskeletal: There are no major deformities. Neurologic: No focal weakness or paresthesias are detected, Skin: There are no ulcer or rashes noted. Psychiatric: The patient has normal affect. Cardiovascular: Excellent thrill within left brachiocephalic fistula.  The patient has a palpable left radial pulse     Assessment: Chronic renal insufficiency Plan: I reassured the patient that I do not think his symptoms he is having in his left shoulder are related to his fistula.  He will occasionally have a slight discomfort in his hand which may be a small component of steal syndrome, however this does not affect him on a daily basis and does not appear to be bothering him that significantly.  I have  reassured him that his fistula is functioning properly, and I don't think his complaints are related to his fistula.  I have told him he has no restrictions on activity.  I have encouraged him to do strengthening exercises and his left hand and arm.  He will call us on an as-needed basis  V. Leia Alf, M.D. [facility] Office: [phone] Pager:  [phone]

## 2013-07-27 ENCOUNTER — Telehealth: Payer: Self-pay | Admitting: *Deleted

## 2013-07-27 NOTE — Telephone Encounter (Signed)
ERROR

## 2013-09-27 ENCOUNTER — Other Ambulatory Visit (HOSPITAL_COMMUNITY): Payer: Self-pay | Admitting: *Deleted

## 2013-09-28 ENCOUNTER — Ambulatory Visit (HOSPITAL_COMMUNITY)
Admission: RE | Admit: 2013-09-28 | Discharge: 2013-09-28 | Disposition: A | Discharge status: Home / Self Care | Payer: 59 | Source: Ambulatory Visit | Attending: Nephrology | Admitting: Nephrology

## 2013-09-28 DIAGNOSIS — N039 Chronic nephritic syndrome with unspecified morphologic changes: Secondary | ICD-10-CM | POA: Diagnosis not present

## 2013-09-28 DIAGNOSIS — D631 Anemia in chronic kidney disease: Secondary | ICD-10-CM | POA: Insufficient documentation

## 2013-09-28 DIAGNOSIS — N184 Chronic kidney disease, stage 4 (severe): Secondary | ICD-10-CM | POA: Insufficient documentation

## 2013-09-28 MED ORDER — SODIUM CHLORIDE 0.9 % IV SOLN
1020.0000 mg | Freq: Once | INTRAVENOUS | Status: AC
  Administered 2013-09-28: 1020 mg via INTRAVENOUS
  Filled 2013-09-28: qty 34

## 2014-05-05 ENCOUNTER — Other Ambulatory Visit (HOSPITAL_COMMUNITY): Payer: Self-pay | Admitting: *Deleted

## 2014-05-06 ENCOUNTER — Encounter (HOSPITAL_COMMUNITY)
Admission: RE | Admit: 2014-05-06 | Discharge: 2014-05-06 | Disposition: A | Discharge status: Home / Self Care | Payer: 59 | Source: Ambulatory Visit | Attending: Nephrology | Admitting: Nephrology

## 2014-05-06 DIAGNOSIS — Z79899 Other long term (current) drug therapy: Secondary | ICD-10-CM | POA: Insufficient documentation

## 2014-05-06 DIAGNOSIS — N184 Chronic kidney disease, stage 4 (severe): Secondary | ICD-10-CM | POA: Insufficient documentation

## 2014-05-06 DIAGNOSIS — Z5181 Encounter for therapeutic drug level monitoring: Secondary | ICD-10-CM | POA: Insufficient documentation

## 2014-05-06 DIAGNOSIS — D631 Anemia in chronic kidney disease: Secondary | ICD-10-CM | POA: Insufficient documentation

## 2014-05-06 LAB — POCT HEMOGLOBIN-HEMACUE: Hemoglobin: 10.9 g/dL — ABNORMAL LOW (ref 13.0–17.0)

## 2014-05-06 MED ORDER — DARBEPOETIN ALFA 60 MCG/0.3ML IJ SOSY
PREFILLED_SYRINGE | INTRAMUSCULAR | Status: AC
  Filled 2014-05-06: qty 0.3

## 2014-05-06 MED ORDER — DARBEPOETIN ALFA 60 MCG/0.3ML IJ SOSY
60.0000 ug | PREFILLED_SYRINGE | INTRAMUSCULAR | Status: DC
  Administered 2014-05-06: 60 ug via SUBCUTANEOUS

## 2014-06-03 ENCOUNTER — Encounter (HOSPITAL_COMMUNITY)
Admission: RE | Admit: 2014-06-03 | Discharge: 2014-06-03 | Disposition: A | Discharge status: Home / Self Care | Payer: 59 | Source: Ambulatory Visit | Attending: Nephrology | Admitting: Nephrology

## 2014-06-03 DIAGNOSIS — D631 Anemia in chronic kidney disease: Secondary | ICD-10-CM | POA: Diagnosis not present

## 2014-06-03 DIAGNOSIS — Z5181 Encounter for therapeutic drug level monitoring: Secondary | ICD-10-CM | POA: Insufficient documentation

## 2014-06-03 DIAGNOSIS — Z79899 Other long term (current) drug therapy: Secondary | ICD-10-CM | POA: Diagnosis not present

## 2014-06-03 DIAGNOSIS — N184 Chronic kidney disease, stage 4 (severe): Secondary | ICD-10-CM | POA: Insufficient documentation

## 2014-06-03 LAB — IRON AND TIBC
Iron: 99 ug/dL (ref 45–182)
SATURATION RATIOS: 24 % (ref 17.9–39.5)
TIBC: 412 ug/dL (ref 250–450)
UIBC: 313 ug/dL

## 2014-06-03 LAB — POCT HEMOGLOBIN-HEMACUE: HEMOGLOBIN: 11.1 g/dL — AB (ref 13.0–17.0)

## 2014-06-03 LAB — FERRITIN: Ferritin: 817 ng/mL — ABNORMAL HIGH (ref 24–336)

## 2014-06-03 MED ORDER — DARBEPOETIN ALFA 60 MCG/0.3ML IJ SOSY
60.0000 ug | PREFILLED_SYRINGE | INTRAMUSCULAR | Status: DC
  Administered 2014-06-03: 60 ug via SUBCUTANEOUS

## 2014-07-08 ENCOUNTER — Encounter (HOSPITAL_COMMUNITY)
Admission: RE | Admit: 2014-07-08 | Discharge: 2014-07-08 | Disposition: A | Discharge status: Home / Self Care | Payer: 59 | Source: Ambulatory Visit | Attending: Nephrology | Admitting: Nephrology

## 2014-07-08 DIAGNOSIS — N184 Chronic kidney disease, stage 4 (severe): Secondary | ICD-10-CM | POA: Diagnosis present

## 2014-07-08 DIAGNOSIS — D631 Anemia in chronic kidney disease: Secondary | ICD-10-CM | POA: Insufficient documentation

## 2014-07-08 DIAGNOSIS — Z79899 Other long term (current) drug therapy: Secondary | ICD-10-CM | POA: Insufficient documentation

## 2014-07-08 DIAGNOSIS — Z5181 Encounter for therapeutic drug level monitoring: Secondary | ICD-10-CM | POA: Insufficient documentation

## 2014-07-08 LAB — CBC
HCT: 34 % — ABNORMAL LOW (ref 39.0–52.0)
Hemoglobin: 11.4 g/dL — ABNORMAL LOW (ref 13.0–17.0)
MCH: 29.8 pg (ref 26.0–34.0)
MCHC: 33.5 g/dL (ref 30.0–36.0)
MCV: 89 fL (ref 78.0–100.0)
Platelets: 145 10*3/uL — ABNORMAL LOW (ref 150–400)
RBC: 3.82 MIL/uL — ABNORMAL LOW (ref 4.22–5.81)
RDW: 13.6 % (ref 11.5–15.5)
WBC: 6.7 10*3/uL (ref 4.0–10.5)

## 2014-07-08 LAB — IRON AND TIBC
Iron: 160 ug/dL (ref 45–182)
SATURATION RATIOS: 37 % (ref 17.9–39.5)
TIBC: 434 ug/dL (ref 250–450)
UIBC: 274 ug/dL

## 2014-07-08 LAB — FERRITIN: FERRITIN: 753 ng/mL — AB (ref 24–336)

## 2014-07-08 MED ORDER — DARBEPOETIN ALFA 60 MCG/0.3ML IJ SOSY
PREFILLED_SYRINGE | INTRAMUSCULAR | Status: AC
  Filled 2014-07-08: qty 0.3

## 2014-07-08 MED ORDER — DARBEPOETIN ALFA 60 MCG/0.3ML IJ SOSY
60.0000 ug | PREFILLED_SYRINGE | INTRAMUSCULAR | Status: DC
  Administered 2014-07-08: 60 ug via SUBCUTANEOUS

## 2014-08-05 ENCOUNTER — Ambulatory Visit (HOSPITAL_COMMUNITY)
Admission: RE | Admit: 2014-08-05 | Discharge: 2014-08-05 | Disposition: A | Discharge status: Home / Self Care | Payer: 59 | Source: Ambulatory Visit | Attending: Nephrology | Admitting: Nephrology

## 2014-08-05 DIAGNOSIS — Z5181 Encounter for therapeutic drug level monitoring: Secondary | ICD-10-CM | POA: Diagnosis not present

## 2014-08-05 DIAGNOSIS — D631 Anemia in chronic kidney disease: Secondary | ICD-10-CM | POA: Insufficient documentation

## 2014-08-05 DIAGNOSIS — N184 Chronic kidney disease, stage 4 (severe): Secondary | ICD-10-CM | POA: Insufficient documentation

## 2014-08-05 DIAGNOSIS — Z79899 Other long term (current) drug therapy: Secondary | ICD-10-CM | POA: Diagnosis not present

## 2014-08-05 LAB — CBC
HEMATOCRIT: 33.1 % — AB (ref 39.0–52.0)
Hemoglobin: 11 g/dL — ABNORMAL LOW (ref 13.0–17.0)
MCH: 30.1 pg (ref 26.0–34.0)
MCHC: 33.2 g/dL (ref 30.0–36.0)
MCV: 90.4 fL (ref 78.0–100.0)
PLATELETS: 167 10*3/uL (ref 150–400)
RBC: 3.66 MIL/uL — AB (ref 4.22–5.81)
RDW: 13.9 % (ref 11.5–15.5)
WBC: 6.9 10*3/uL (ref 4.0–10.5)

## 2014-08-05 LAB — IRON AND TIBC
IRON: 123 ug/dL (ref 45–182)
Saturation Ratios: 30 % (ref 17.9–39.5)
TIBC: 412 ug/dL (ref 250–450)
UIBC: 289 ug/dL

## 2014-08-05 LAB — FERRITIN: FERRITIN: 671 ng/mL — AB (ref 24–336)

## 2014-08-05 MED ORDER — DARBEPOETIN ALFA 60 MCG/0.3ML IJ SOSY
PREFILLED_SYRINGE | INTRAMUSCULAR | Status: AC
  Filled 2014-08-05: qty 0.3

## 2014-08-05 MED ORDER — DARBEPOETIN ALFA 60 MCG/0.3ML IJ SOSY
60.0000 ug | PREFILLED_SYRINGE | INTRAMUSCULAR | Status: DC
  Administered 2014-08-05: 60 ug via SUBCUTANEOUS

## 2014-09-02 ENCOUNTER — Inpatient Hospital Stay (HOSPITAL_COMMUNITY): Admission: RE | Admit: 2014-09-02 | Payer: 59 | Source: Ambulatory Visit

## 2014-09-23 ENCOUNTER — Ambulatory Visit (HOSPITAL_COMMUNITY)
Admission: RE | Admit: 2014-09-23 | Discharge: 2014-09-23 | Disposition: A | Discharge status: Home / Self Care | Payer: 59 | Source: Ambulatory Visit | Attending: Nephrology | Admitting: Nephrology

## 2014-09-23 DIAGNOSIS — N184 Chronic kidney disease, stage 4 (severe): Secondary | ICD-10-CM | POA: Diagnosis present

## 2014-09-23 DIAGNOSIS — Z5181 Encounter for therapeutic drug level monitoring: Secondary | ICD-10-CM | POA: Insufficient documentation

## 2014-09-23 DIAGNOSIS — D631 Anemia in chronic kidney disease: Secondary | ICD-10-CM | POA: Diagnosis not present

## 2014-09-23 DIAGNOSIS — Z79899 Other long term (current) drug therapy: Secondary | ICD-10-CM | POA: Diagnosis not present

## 2014-09-23 LAB — FERRITIN: Ferritin: 1035 ng/mL — ABNORMAL HIGH (ref 24–336)

## 2014-09-23 LAB — IRON AND TIBC
IRON: 131 ug/dL (ref 45–182)
Saturation Ratios: 29 % (ref 17.9–39.5)
TIBC: 445 ug/dL (ref 250–450)
UIBC: 314 ug/dL

## 2014-09-23 LAB — CBC
HCT: 30.6 % — ABNORMAL LOW (ref 39.0–52.0)
Hemoglobin: 9.8 g/dL — ABNORMAL LOW (ref 13.0–17.0)
MCH: 30.2 pg (ref 26.0–34.0)
MCHC: 32 g/dL (ref 30.0–36.0)
MCV: 94.4 fL (ref 78.0–100.0)
PLATELETS: 116 10*3/uL — AB (ref 150–400)
RBC: 3.24 MIL/uL — ABNORMAL LOW (ref 4.22–5.81)
RDW: 14.4 % (ref 11.5–15.5)
WBC: 7.2 10*3/uL (ref 4.0–10.5)

## 2014-09-23 MED ORDER — DARBEPOETIN ALFA 60 MCG/0.3ML IJ SOSY
60.0000 ug | PREFILLED_SYRINGE | INTRAMUSCULAR | Status: DC
  Administered 2014-09-23: 60 ug via SUBCUTANEOUS

## 2014-09-23 MED ORDER — DARBEPOETIN ALFA 60 MCG/0.3ML IJ SOSY
PREFILLED_SYRINGE | INTRAMUSCULAR | Status: AC
  Filled 2014-09-23: qty 0.3

## 2014-10-21 ENCOUNTER — Encounter (HOSPITAL_COMMUNITY)
Admission: RE | Admit: 2014-10-21 | Discharge: 2014-10-21 | Disposition: A | Discharge status: Home / Self Care | Payer: 59 | Source: Ambulatory Visit | Attending: Nephrology | Admitting: Nephrology

## 2014-10-21 DIAGNOSIS — Z5181 Encounter for therapeutic drug level monitoring: Secondary | ICD-10-CM | POA: Diagnosis not present

## 2014-10-21 DIAGNOSIS — D631 Anemia in chronic kidney disease: Secondary | ICD-10-CM | POA: Insufficient documentation

## 2014-10-21 DIAGNOSIS — Z79899 Other long term (current) drug therapy: Secondary | ICD-10-CM | POA: Insufficient documentation

## 2014-10-21 DIAGNOSIS — N184 Chronic kidney disease, stage 4 (severe): Secondary | ICD-10-CM | POA: Insufficient documentation

## 2014-10-21 LAB — CBC
HCT: 30.7 % — ABNORMAL LOW (ref 39.0–52.0)
Hemoglobin: 10.1 g/dL — ABNORMAL LOW (ref 13.0–17.0)
MCH: 30.4 pg (ref 26.0–34.0)
MCHC: 32.9 g/dL (ref 30.0–36.0)
MCV: 92.5 fL (ref 78.0–100.0)
PLATELETS: 153 10*3/uL (ref 150–400)
RBC: 3.32 MIL/uL — AB (ref 4.22–5.81)
RDW: 13.7 % (ref 11.5–15.5)
WBC: 6.8 10*3/uL (ref 4.0–10.5)

## 2014-10-21 LAB — IRON AND TIBC
Iron: 147 ug/dL (ref 45–182)
Saturation Ratios: 33 % (ref 17.9–39.5)
TIBC: 445 ug/dL (ref 250–450)
UIBC: 298 ug/dL

## 2014-10-21 LAB — FERRITIN: Ferritin: 1076 ng/mL — ABNORMAL HIGH (ref 24–336)

## 2014-10-21 MED ORDER — DARBEPOETIN ALFA 60 MCG/0.3ML IJ SOSY: 60.0000 ug | PREFILLED_SYRINGE | INTRAMUSCULAR | Status: DC

## 2014-10-21 MED ORDER — DARBEPOETIN ALFA 60 MCG/0.3ML IJ SOSY
PREFILLED_SYRINGE | INTRAMUSCULAR | Status: AC
  Administered 2014-10-21: 60 ug via SUBCUTANEOUS
  Filled 2014-10-21: qty 0.3

## 2014-11-17 ENCOUNTER — Other Ambulatory Visit (HOSPITAL_COMMUNITY): Payer: Self-pay | Admitting: *Deleted

## 2014-11-18 ENCOUNTER — Encounter (HOSPITAL_COMMUNITY): Payer: 59

## 2014-11-25 HISTORY — PX: COLONOSCOPY: SHX174

## 2015-01-13 ENCOUNTER — Ambulatory Visit
Admission: RE | Admit: 2015-01-13 | Discharge: 2015-01-13 | Disposition: A | Discharge status: Home / Self Care | Payer: 59 | Source: Ambulatory Visit | Attending: Nephrology | Admitting: Nephrology

## 2015-01-13 ENCOUNTER — Other Ambulatory Visit: Payer: Self-pay | Admitting: Nephrology

## 2015-01-13 DIAGNOSIS — A150 Tuberculosis of lung: Secondary | ICD-10-CM

## 2015-09-15 ENCOUNTER — Other Ambulatory Visit: Payer: Self-pay | Admitting: Nephrology

## 2015-09-15 DIAGNOSIS — Z7682 Awaiting organ transplant status: Secondary | ICD-10-CM

## 2015-09-19 ENCOUNTER — Inpatient Hospital Stay: Admission: RE | Admit: 2015-09-19 | Payer: 59 | Source: Ambulatory Visit

## 2015-11-22 ENCOUNTER — Encounter: Payer: Self-pay | Admitting: *Deleted

## 2015-11-22 DIAGNOSIS — K219 Gastro-esophageal reflux disease without esophagitis: Secondary | ICD-10-CM | POA: Insufficient documentation

## 2015-11-22 DIAGNOSIS — M19072 Primary osteoarthritis, left ankle and foot: Secondary | ICD-10-CM

## 2015-11-22 DIAGNOSIS — M19071 Primary osteoarthritis, right ankle and foot: Secondary | ICD-10-CM

## 2015-11-22 DIAGNOSIS — M2240 Chondromalacia patellae, unspecified knee: Secondary | ICD-10-CM | POA: Insufficient documentation

## 2015-11-22 DIAGNOSIS — M7542 Impingement syndrome of left shoulder: Secondary | ICD-10-CM

## 2015-11-22 DIAGNOSIS — M170 Bilateral primary osteoarthritis of knee: Secondary | ICD-10-CM

## 2015-11-22 DIAGNOSIS — N028 Recurrent and persistent hematuria with other morphologic changes: Secondary | ICD-10-CM

## 2015-11-22 DIAGNOSIS — M25812 Other specified joint disorders, left shoulder: Secondary | ICD-10-CM | POA: Insufficient documentation

## 2015-11-22 DIAGNOSIS — N02B9 Other recurrent and persistent immunoglobulin A nephropathy: Secondary | ICD-10-CM

## 2015-11-22 HISTORY — DX: Bilateral primary osteoarthritis of knee: M17.0

## 2015-11-22 HISTORY — DX: Recurrent and persistent hematuria with other morphologic changes: N02.8

## 2015-11-22 HISTORY — DX: Other specified joint disorders, left shoulder: M25.812

## 2015-11-22 HISTORY — DX: Impingement syndrome of left shoulder: M75.42

## 2015-11-22 HISTORY — DX: Primary osteoarthritis, left ankle and foot: M19.071

## 2015-11-22 HISTORY — DX: Chondromalacia patellae, unspecified knee: M22.40

## 2015-11-22 HISTORY — DX: Other recurrent and persistent immunoglobulin A nephropathy: N02.B9

## 2015-11-24 ENCOUNTER — Ambulatory Visit: Payer: Self-pay | Admitting: Rheumatology

## 2015-12-14 ENCOUNTER — Encounter: Payer: Self-pay | Admitting: *Deleted

## 2015-12-14 NOTE — Progress Notes (Signed)
Office Visit Note  Patient: Colin Mcfarland             Date of Birth: 10-30-1965           MRN: [id number]             PCP: Tivis Ringer, MD Referring: [name], MD Visit Date: 12/15/2015 Occupation: @GUAROCC @    Subjective:  Follow-up Follow-up on gout, chronic kidney disease for which she is on dialysis at 4 times per week, OA of the hands and knee joint.  History of Present Illness: Colin Mcfarland is a 50 y.o. male  Last seen 06/23/2015 Is on allopurinol 100 mg daily for optimal Rx On the last visit, patient did not want to take colcry     Activities of Daily Living:  Patient reports morning stiffness for 15 minutes.   Patient Denies nocturnal pain.  Difficulty dressing/grooming: Denies Difficulty climbing stairs: Denies Difficulty getting out of chair: Denies Difficulty using hands for taps, buttons, cutlery, and/or writing: Denies   Review of Systems  Constitutional: Negative for fatigue.  HENT: Negative for mouth sores and mouth dryness.   Eyes: Negative for dryness.  Respiratory: Negative for shortness of breath.   Gastrointestinal: Negative for constipation and diarrhea.  Musculoskeletal: Negative for myalgias and myalgias.  Skin: Negative for sensitivity to sunlight.  Neurological: Negative for memory loss.  Psychiatric/Behavioral: Negative for sleep disturbance.    PMFS History:  Patient Active Problem List   Diagnosis Date Noted  . GERD (gastroesophageal reflux disease) 11/22/2015  . Shoulder impingement, left 11/22/2015  . Osteoarthritis of both feet 11/22/2015  . Osteoarthritis of both knees 11/22/2015  . 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  . Pain of left upper extremity 07/21/2013  . 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 Murmur 04/12/2013  . Syncope 04/12/2013  . Chronic kidney disease (CKD), stage IV (severe) ([place]) 03/02/2013  . End stage renal disease ([place])  02/25/2012  . HYPOTHYROIDISM 01/14/2007  . Gout 01/14/2007  . HYPERTENSION 01/14/2007  . GASTRITIS, CHRONIC 01/14/2007  . IGA NEPHROPATHY 01/14/2007  . OTHER DYSPHAGIA 01/14/2007  . DIARRHEA 01/14/2007  . HELICOBACTER PYLORI INFECTION, HX OF 01/14/2007    Past Medical History:  Diagnosis Date  . Anemia   . Arthritis   . 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 Chronic kidney disease   . Eczema    hx: of  . GERD (gastroesophageal reflux disease)   . Hypertension   . Hypothyroidism   . 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  . Osteoarthritis of both feet 11/22/2015  . Osteoarthritis of both knees 11/22/2015  . Pneumonia   . Shoulder impingement, left 11/22/2015    Family History  Problem Relation Age of Onset  . Diabetes Father   . Hypertension Father   . Hypertension Mother   . Diabetes Brother    Past Surgical History:  Procedure Laterality Date  . AV FISTULA PLACEMENT Left 03/18/2013   Procedure: ARTERIOVENOUS (AV) FISTULA CREATION- BRACHIOCEPHALIC;  Surgeon: Mal Misty, MD;  Location: [facility];  Service: Vascular;  Laterality: Left;  . HEMORRHOID SURGERY    . RENAL BIOPSY    . SHOULDER SURGERY     Social History   Social History Narrative  . No narrative on file     Objective: Vital Signs: BP (!) 143/80 (BP Location: Right Arm, Patient Position: Sitting, Cuff Size: Large)   Pulse 77   Resp 14   Ht 5\' 8"  (1.727 m)  Wt 171 lb (77.6 kg)   BMI 26.00 kg/m    Physical Exam  Constitutional: He is oriented to person, place, and time. He appears well-developed and well-nourished.  HENT:  Head: Normocephalic and atraumatic.  Eyes: Conjunctivae and EOM are normal. Pupils are equal, round, and reactive to light.  Neck: Normal range of motion. Neck supple.  Cardiovascular: Normal rate, regular rhythm and normal heart sounds.  Exam reveals no gallop and no friction rub.   No murmur heard. Pulmonary/Chest: Effort normal and breath sounds normal. No respiratory distress. He has no  wheezes. He has no rales. He exhibits no tenderness.  Abdominal: Soft. He exhibits no distension and no mass. There is no tenderness. There is no guarding.  Musculoskeletal: Normal range of motion.  Lymphadenopathy:    He has no cervical adenopathy.  Neurological: He is alert and oriented to person, place, and time. He exhibits normal muscle tone. Coordination normal.  Skin: Skin is warm and dry. Capillary refill takes less than 2 seconds. No rash noted.  Psychiatric: He has a normal mood and affect. His behavior is normal. Judgment and thought content normal.  Vitals reviewed.    Musculoskeletal Exam:  Full range of motion of all joints Grip strength is equal and strong bilaterally Fiber myalgia tender points are all absent  CDAI Exam: No CDAI exam completed.    Investigation: No additional findings.   Imaging: No results found.  Speciality Comments: No specialty comments available.    Procedures:  No procedures performed Allergies: Nsaids; Uloric [febuxostat]; and Sulfa antibiotics   Assessment / Plan:     Visit Diagnoses: Chronic gout without tophus, unspecified cause, unspecified site - Plan: CBC with Differential/Platelet, COMPLETE METABOLIC PANEL WITH GFR, Uric acid  Stage 5 chronic kidney disease on chronic dialysis ([place]) - Plan: CBC with Differential/Platelet, COMPLETE METABOLIC PANEL WITH GFR, Uric acid  Primary osteoarthritis of both knees  Primary osteoarthritis of both hands   Nephrologist will refill the patient's allopurinol at 100 mg   Patient does use [facility] in flare. He's asking for prednisone taper for flares that don't resolve with [facility] due to his poor kidney function. I'll be happy to give him a written prescription for prednisone to use in the future under the circumstances. I've asked the patient to give [place] a call if it happens to occur on the weekend or after clinic hours so that we are able to track him any flares he's having and how  often he is ending up using the prednisone. We'll write a prescription for prednisone 5 mg: 4 pills for 2 days, 3 pills for 2 days, 2 pills for 2 days, 1 pill for 2 days, then stop. With no refills.  Patient will give [place] labs before the next office visit since he gets it on a monthly basis through his nephrology office visits.  He will need CBC with differential CMP with GFR and uric acid today.  Orders: Orders Placed This Encounter  Procedures  . CBC with Differential/Platelet  . COMPLETE METABOLIC PANEL WITH GFR  . Uric acid   Meds ordered this encounter  Medications  . predniSONE (DELTASONE) 5 MG tablet    Sig: 4po qAM x 2 days, ; 3po qAM x 2 days, ; 2po qAM x 2 days,; 1po qAM x 2 days,; then stop.; disp 20 pills w/ no refills.    Dispense:  20 tablet    Refill:  0    Order Specific Question:   Supervising Provider  Answer:   Bo Merino [[id number]]    Face-to-face time spent with patient was 30 minutes. 50% of time was spent in counseling and coordination of care.  Follow-Up Instructions: Return in about 5 months (around 05/14/2016) for gout, allopurinol,ckd, oakj, oahand.   Eliezer Lofts, PA-C   I examined and evaluated the patient with Eliezer Lofts PA. The plan of care was discussed as noted above.  Bo Merino, MD

## 2015-12-15 ENCOUNTER — Encounter: Payer: Self-pay | Admitting: Rheumatology

## 2015-12-15 ENCOUNTER — Ambulatory Visit (INDEPENDENT_AMBULATORY_CARE_PROVIDER_SITE_OTHER): Payer: 59 | Admitting: Rheumatology

## 2015-12-15 VITALS — BP 143/80 | HR 77 | Resp 14 | Ht 68.0 in | Wt 171.0 lb

## 2015-12-15 DIAGNOSIS — Z992 Dependence on renal dialysis: Secondary | ICD-10-CM

## 2015-12-15 DIAGNOSIS — M1A9XX0 Chronic gout, unspecified, without tophus (tophi): Secondary | ICD-10-CM

## 2015-12-15 DIAGNOSIS — M19042 Primary osteoarthritis, left hand: Secondary | ICD-10-CM | POA: Diagnosis not present

## 2015-12-15 DIAGNOSIS — N186 End stage renal disease: Secondary | ICD-10-CM | POA: Diagnosis not present

## 2015-12-15 DIAGNOSIS — M19041 Primary osteoarthritis, right hand: Secondary | ICD-10-CM | POA: Diagnosis not present

## 2015-12-15 DIAGNOSIS — M170 Bilateral primary osteoarthritis of knee: Secondary | ICD-10-CM | POA: Diagnosis not present

## 2015-12-15 MED ORDER — PREDNISONE 5 MG PO TABS: ORAL_TABLET | ORAL | 0 refills | Status: DC

## 2015-12-15 NOTE — Patient Instructions (Signed)
At next office visit with our office, please bring a copy of labs done during your nephrology office visit

## 2015-12-16 LAB — COMPLETE METABOLIC PANEL WITH GFR
ALT: 21 U/L (ref 9–46)
AST: 26 U/L (ref 10–35)
Albumin: 4.4 g/dL (ref 3.6–5.1)
Alkaline Phosphatase: 51 U/L (ref 40–115)
BILIRUBIN TOTAL: 0.5 mg/dL (ref 0.2–1.2)
BUN: 42 mg/dL — ABNORMAL HIGH (ref 7–25)
CHLORIDE: 93 mmol/L — AB (ref 98–110)
CO2: 23 mmol/L (ref 20–31)
Calcium: 9.8 mg/dL (ref 8.6–10.3)
Creat: 9.49 mg/dL — ABNORMAL HIGH (ref 0.70–1.33)
GFR, EST NON AFRICAN AMERICAN: 6 mL/min — AB (ref >=60)
GFR, Est African American: 7 mL/min — ABNORMAL LOW (ref >=60)
GLUCOSE: 134 mg/dL — AB (ref 65–99)
Potassium: 4.4 mmol/L (ref 3.5–5.3)
SODIUM: 133 mmol/L — AB (ref 135–146)
TOTAL PROTEIN: 6.8 g/dL (ref 6.1–8.1)

## 2015-12-16 LAB — CBC WITH DIFFERENTIAL/PLATELET
BASOS PCT: 0 %
Basophils Absolute: 0 cells/uL (ref 0–200)
EOS PCT: 9 %
Eosinophils Absolute: 783 cells/uL — ABNORMAL HIGH (ref 15–500)
HCT: 33.1 % — ABNORMAL LOW (ref 38.5–50.0)
Hemoglobin: 11 g/dL — ABNORMAL LOW (ref 13.2–17.1)
Lymphocytes Relative: 26 %
Lymphs Abs: 2262 cells/uL (ref 850–3900)
MCH: 29.9 pg (ref 27.0–33.0)
MCHC: 33.2 g/dL (ref 32.0–36.0)
MCV: 89.9 fL (ref 80.0–100.0)
MONOS PCT: 9 %
MPV: 9.4 fL (ref 7.5–12.5)
Monocytes Absolute: 783 cells/uL (ref 200–950)
NEUTROS ABS: 4872 {cells}/uL (ref 1500–7800)
Neutrophils Relative %: 56 %
PLATELETS: 207 10*3/uL (ref 140–400)
RBC: 3.68 MIL/uL — ABNORMAL LOW (ref 4.20–5.80)
RDW: 16.3 % — AB (ref 11.0–15.0)
WBC: 8.7 10*3/uL (ref 3.8–10.8)

## 2015-12-16 LAB — URIC ACID: Uric Acid, Serum: 3.5 mg/dL — ABNORMAL LOW (ref 4.0–8.0)

## 2015-12-17 NOTE — Progress Notes (Signed)
Call patient and find out how he's doing on Monday Send these labs to nephrologist because of significant elevated creatinine and decreased GFR  Note: Patient does home dialysis 4 times a week. His recent dialysis was on 12/15/2015 after these labs were drawn.I spoke with the patient myself on Saturday due to the critical high levels of the creatinine and patient was doing well. According to the patient these levels are normal for him.  Uric acid is at 3.5 and in desirable range. Please notify patient. No change in treatment of the Uricosuric he's taking  CBC with differential is within normal limits except mild anemia. We will monitor.  Send all labs to his PCP and his nephrologist please

## 2016-01-12 ENCOUNTER — Ambulatory Visit
Admission: RE | Admit: 2016-01-12 | Discharge: 2016-01-12 | Disposition: A | Discharge status: Home / Self Care | Payer: 59 | Source: Ambulatory Visit | Attending: Nephrology | Admitting: Nephrology

## 2016-01-12 DIAGNOSIS — Z7682 Awaiting organ transplant status: Secondary | ICD-10-CM

## 2016-02-14 DIAGNOSIS — N186 End stage renal disease: Secondary | ICD-10-CM | POA: Diagnosis not present

## 2016-02-14 DIAGNOSIS — N2589 Other disorders resulting from impaired renal tubular function: Secondary | ICD-10-CM | POA: Diagnosis not present

## 2016-02-14 DIAGNOSIS — Z992 Dependence on renal dialysis: Secondary | ICD-10-CM | POA: Diagnosis not present

## 2016-02-18 DIAGNOSIS — N186 End stage renal disease: Secondary | ICD-10-CM | POA: Diagnosis not present

## 2016-02-18 DIAGNOSIS — I120 Hypertensive chronic kidney disease with stage 5 chronic kidney disease or end stage renal disease: Secondary | ICD-10-CM | POA: Diagnosis not present

## 2016-02-18 DIAGNOSIS — Z7682 Awaiting organ transplant status: Secondary | ICD-10-CM | POA: Diagnosis not present

## 2016-02-18 DIAGNOSIS — Z992 Dependence on renal dialysis: Secondary | ICD-10-CM | POA: Diagnosis not present

## 2016-02-18 DIAGNOSIS — Z79899 Other long term (current) drug therapy: Secondary | ICD-10-CM | POA: Diagnosis not present

## 2016-03-13 DIAGNOSIS — Z992 Dependence on renal dialysis: Secondary | ICD-10-CM | POA: Diagnosis not present

## 2016-03-13 DIAGNOSIS — N2589 Other disorders resulting from impaired renal tubular function: Secondary | ICD-10-CM | POA: Diagnosis not present

## 2016-03-13 DIAGNOSIS — N186 End stage renal disease: Secondary | ICD-10-CM | POA: Diagnosis not present

## 2016-05-17 ENCOUNTER — Ambulatory Visit
Admission: RE | Admit: 2016-05-17 | Discharge: 2016-05-17 | Disposition: A | Discharge status: Home / Self Care | Payer: 59 | Source: Ambulatory Visit | Attending: Nephrology | Admitting: Nephrology

## 2016-05-17 ENCOUNTER — Ambulatory Visit: Payer: Medicare Other | Admitting: Rheumatology

## 2016-05-17 ENCOUNTER — Other Ambulatory Visit: Payer: Self-pay | Admitting: Nephrology

## 2016-05-17 DIAGNOSIS — Z09 Encounter for follow-up examination after completed treatment for conditions other than malignant neoplasm: Secondary | ICD-10-CM

## 2016-05-20 NOTE — Progress Notes (Signed)
Office Visit Note  Patient: Colin Mcfarland             Date of Birth: [birth year]           MRN: [id number]             PCP: [name], MD Referring: [name], MD Visit Date: 05/21/2016 Occupation: _0 @    Subjective:  Left shoulder joint discomfort   History of Present Illness: Porter Moes is a 51 y.o. male with history of gouty arthropathy and osteoarthritis. He states that he has not had any flares of gout since the last visit. His last gout flare was almost an year ago. He's been having pain and discomfort in his left shoulder and difficulty lifting his left arm. He has some discomfort in his knee joints but is tolerable. He does not have much discomfort in his feet.  Activities of Daily Living:  Patient reports morning stiffness for 30 minutes.   Patient Denies nocturnal pain.  Difficulty dressing/grooming: Denies Difficulty climbing stairs: Denies Difficulty getting out of chair: Denies Difficulty using hands for taps, buttons, cutlery, and/or writing: Denies   Review of Systems  Constitutional: Positive for fatigue and weakness. Negative for night sweats, weight gain and weight loss.  HENT: Negative for mouth sores, mouth dryness and nose dryness.   Eyes: Negative for dryness.  Respiratory: Negative for cough, shortness of breath and difficulty breathing.   Cardiovascular: Positive for hypertension and swelling in legs/feet. Negative for chest pain, palpitations and irregular heartbeat.  Gastrointestinal: Positive for constipation. Negative for blood in stool and diarrhea.  Genitourinary: Negative for painful urination.  Musculoskeletal: Positive for arthralgias, joint pain and morning stiffness. Negative for joint swelling, myalgias and myalgias.  Skin: Negative for color change, rash, nodules/bumps, redness, skin tightness and ulcers.  Allergic/Immunologic: Negative for susceptible to infections.  Neurological: Positive for dizziness. Negative for night  sweats.  Hematological: Negative for swollen glands.  Psychiatric/Behavioral: Negative for depressed mood and sleep disturbance. The patient is not nervous/anxious.     PMFS History:  Patient Active Problem List   Diagnosis Date Noted  . GERD (gastroesophageal reflux disease) 11/22/2015  . Shoulder impingement, left 11/22/2015  . Osteoarthritis of both feet 11/22/2015  . Osteoarthritis of both knees 11/22/2015  . 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  . Pain of left upper extremity 07/21/2013  . 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 Murmur 04/12/2013  . Syncope 04/12/2013  . Chronic kidney disease (CKD), stage IV (severe) ([place]) 03/02/2013  . End stage renal disease ([place]) 02/25/2012  . HYPOTHYROIDISM 01/14/2007  . Gout 01/14/2007  . HYPERTENSION 01/14/2007  . GASTRITIS, CHRONIC 01/14/2007  . IGA NEPHROPATHY 01/14/2007  . OTHER DYSPHAGIA 01/14/2007  . DIARRHEA 01/14/2007  . HELICOBACTER PYLORI INFECTION, HX OF 01/14/2007    Past Medical History:  Diagnosis Date  . Anemia   . Arthritis   . 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 Chronic kidney disease   . Eczema    hx: of  . GERD (gastroesophageal reflux disease)   . Hypertension   . Hypothyroidism   . 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  . Osteoarthritis of both feet 11/22/2015  . Osteoarthritis of both knees 11/22/2015  . Pneumonia   . Shoulder impingement, left 11/22/2015    Family History  Problem Relation Age of Onset  . Diabetes Father   . Hypertension Father   . Hypertension Mother   . Diabetes Brother    Past Surgical  History:  Procedure Laterality Date  . AV FISTULA PLACEMENT Left 03/18/2013   Procedure: ARTERIOVENOUS (AV) FISTULA CREATION- BRACHIOCEPHALIC;  Surgeon: Mal Misty, MD;  Location: [facility];  Service: Vascular;  Laterality: Left;  . HEMORRHOID SURGERY    . RENAL BIOPSY    . SHOULDER SURGERY     Social History   Social History  Narrative  . No narrative on file     Objective: Vital Signs: BP 129/76   Pulse 68   Resp 12   Ht _0  (1.753 m)   Wt 168 lb (76.2 kg)   BMI 24.81 kg/m    Physical Exam  Constitutional: He is oriented to person, place, and time. He appears well-developed and well-nourished.  HENT:  Head: Normocephalic and atraumatic.  Eyes: Conjunctivae and EOM are normal. Pupils are equal, round, and reactive to light.  Neck: Normal range of motion. Neck supple.  Cardiovascular: Normal rate and regular rhythm.   Murmur heard. Pulmonary/Chest: Effort normal and breath sounds normal.  Abdominal: Soft. Bowel sounds are normal.  Neurological: He is alert and oriented to person, place, and time.  Skin: Skin is warm and dry. Capillary refill takes less than 2 seconds.  Fistula for dialysis in left arm  Psychiatric: He has a normal mood and affect. His behavior is normal.  Nursing note and vitals reviewed.    Musculoskeletal Exam: C-spine and thoracic lumbar spine good range of motion. His left shoulder joint abduction is limited to 110 with discomfort. Right shoulder joints are good range of motion. Elbow joints wrist joint MCPs PIPs DIPs with good range of motion with no synovitis. Hip joints knee joints ankles MTPs with good range of motion.  CDAI Exam: No CDAI exam completed.    Investigation: No additional findings.  No visits with results within 3 Month(s) from this visit.  Latest known visit with results is:  Office Visit on 12/15/2015  Component Date Value Ref Range Status  . WBC 12/15/2015 8.7  3.8 - 10.8 K/uL Final  . RBC 12/15/2015 3.68* 4.20 - 5.80 MIL/uL Final  . Hemoglobin 12/15/2015 11.0* 13.2 - 17.1 g/dL Final  . HCT 12/15/2015 33.1* 38.5 - 50.0 % Final  . MCV 12/15/2015 89.9  80.0 - 100.0 fL Final  . MCH 12/15/2015 29.9  27.0 - 33.0 pg Final  . MCHC 12/15/2015 33.2  32.0 - 36.0 g/dL Final  . RDW 12/15/2015 16.3* 11.0 - 15.0 % Final  . Platelets 12/15/2015 207  140 -  400 K/uL Final  . MPV 12/15/2015 9.4  7.5 - 12.5 fL Final  . Neutro Abs 12/15/2015 4872  1,500 - 7,800 cells/uL Final  . Lymphs Abs 12/15/2015 2262  850 - 3,900 cells/uL Final  . Monocytes Absolute 12/15/2015 783  200 - 950 cells/uL Final  . Eosinophils Absolute 12/15/2015 783* 15 - 500 cells/uL Final  . Basophils Absolute 12/15/2015 0  0 - 200 cells/uL Final  . Neutrophils Relative % 12/15/2015 56  % Final  . Lymphocytes Relative 12/15/2015 26  % Final  . Monocytes Relative 12/15/2015 9  % Final  . Eosinophils Relative 12/15/2015 9  % Final  . Basophils Relative 12/15/2015 0  % Final  . Smear Review 12/15/2015 Criteria for review not met   Final  . Sodium 12/15/2015 133* 135 - 146 mmol/L Final  . Potassium 12/15/2015 4.4  3.5 - 5.3 mmol/L Final  . Chloride 12/15/2015 93* 98 - 110 mmol/L Final  . CO2 12/15/2015 23  20 -  31 mmol/L Final  . Glucose, Bld 12/15/2015 134* 65 - 99 mg/dL Final  . BUN 12/15/2015 42* 7 - 25 mg/dL Final  . Creat 12/15/2015 9.49* 0.70 - 1.33 mg/dL Final   Comment: Result repeated and verified.   For patients > or = 52 years of age: The upper reference limit for Creatinine is approximately 13% higher for people identified as African-American.     . Total Bilirubin 12/15/2015 0.5  0.2 - 1.2 mg/dL Final  . Alkaline Phosphatase 12/15/2015 51  40 - 115 U/L Final  . AST 12/15/2015 26  10 - 35 U/L Final  . ALT 12/15/2015 21  9 - 46 U/L Final  . Total Protein 12/15/2015 6.8  6.1 - 8.1 g/dL Final  . Albumin 12/15/2015 4.4  3.6 - 5.1 g/dL Final  . Calcium 12/15/2015 9.8  8.6 - 10.3 mg/dL Final  . GFR, Est African American 12/15/2015 7* >=60 mL/min Final  . GFR, Est Non African American 12/15/2015 6* >=60 mL/min Final  . Uric Acid, Serum 12/15/2015 3.5* 4.0 - 8.0 mg/dL Final    Imaging: Dg Chest 2 View  Result Date: 05/17/2016 CLINICAL DATA:  TB screen. EXAM: CHEST  2 VIEW COMPARISON:  01/13/2015. FINDINGS: Low lung volumes accentuate the heart size which is  within normal limits. Bibasilar increased markings also reflect poor expiratory effort. No apical disease or adenopathy. Radiodense material in the colon, from previous contrast examination. No osseous findings. IMPRESSION: Poor inspiratory effort. No active infiltrates or failure. Allowing for degree of inspiration, similar appearance to priors. Electronically Signed   By: Staci Righter M.D.   On: 05/17/2016 14:29    Speciality Comments: No specialty comments available.    Procedures:  Large Joint Inj Date/Time: 05/21/2016 4:56 PM Performed by: Bo Merino Authorized by: Bo Merino   Consent Given by:  Patient Site marked: the procedure site was marked   Timeout: prior to procedure the correct patient, procedure, and site was verified   Indications:  Pain Location:  Shoulder Site:  L glenohumeral Prep: patient was prepped and draped in usual sterile fashion   Needle Size:  27 G Needle Length:  1.5 inches Approach:  Posterior Ultrasound Guidance: No   Fluoroscopic Guidance: No   Arthrogram: No   Medications:  1 mL lidocaine 1 %; 40 mg triamcinolone acetonide 40 MG/ML Aspiration Attempted: Yes   Aspirate amount (mL):  0 Patient tolerance:  Patient tolerated the procedure well with no immediate complications   Allergies: Nsaids; Uloric [febuxostat]; and Sulfa antibiotics   Assessment / Plan:     Visit Diagnoses: Chronic gout without tophus, unspecified cause, unspecified site - Plan: His scalp seems to be very well controlled on current medication without any flare. Uric acid wasn't desirable range and December. Advised him to get uric acid again with his next labs which will be done with his nephrologist. CBC with Differential/Platelet, COMPLETE METABOLIC PANEL WITH GFR, Uric acid  Shoulder impingement, left: He has decreased range of motion and discomfort. After different treatment options and their side effects were discussed left shoulder joint was injected with  cortisone as described above. He tolerated the procedure well. I also gave him a handout on some of the exercises which he can do.  Chondromalacia of patella,Osteoarthritis of both knees: He has chronic discomfort.  Osteoarthritis of both feet, : Proper fitting shoes were discussed.  End stage renal disease [facility]): He awaits kidney transplant. He is followed up with nephrology.  Chronic kidney disease (CKD), stage  IV (severe) ([place]) - Plan: CBC with Differential/Platelet, COMPLETE METABOLIC PANEL WITH GFR, Uric acid  History of gastroesophageal reflux (GERD)    Orders: Orders Placed This Encounter  Procedures  . Large Joint Injection/Arthrocentesis  . CBC with Differential/Platelet  . COMPLETE METABOLIC PANEL WITH GFR  . Uric acid   No orders of the defined types were placed in this encounter.   Face-to-face time spent with patient was 30 minutes. 50% of time was spent in counseling and coordination of care.  Follow-Up Instructions: Return in about 6 months (around 11/21/2016) for Gout, osteoarthritis.   Bo Merino, MD  Note - This record has been created using Editor, commissioning.  Chart creation errors have been sought, but may not always  have been located. Such creation errors do not reflect on  the standard of medical care.

## 2016-05-21 ENCOUNTER — Encounter: Payer: Self-pay | Admitting: Rheumatology

## 2016-05-21 ENCOUNTER — Ambulatory Visit (INDEPENDENT_AMBULATORY_CARE_PROVIDER_SITE_OTHER): Payer: 59 | Admitting: Rheumatology

## 2016-05-21 VITALS — BP 129/76 | HR 68 | Resp 12 | Ht 69.0 in | Wt 168.0 lb

## 2016-05-21 DIAGNOSIS — M19071 Primary osteoarthritis, right ankle and foot: Secondary | ICD-10-CM | POA: Diagnosis not present

## 2016-05-21 DIAGNOSIS — M2240 Chondromalacia patellae, unspecified knee: Secondary | ICD-10-CM

## 2016-05-21 DIAGNOSIS — Z8719 Personal history of other diseases of the digestive system: Secondary | ICD-10-CM | POA: Diagnosis not present

## 2016-05-21 DIAGNOSIS — N186 End stage renal disease: Secondary | ICD-10-CM | POA: Diagnosis not present

## 2016-05-21 DIAGNOSIS — M170 Bilateral primary osteoarthritis of knee: Secondary | ICD-10-CM | POA: Diagnosis not present

## 2016-05-21 DIAGNOSIS — M19072 Primary osteoarthritis, left ankle and foot: Secondary | ICD-10-CM

## 2016-05-21 DIAGNOSIS — M1A9XX0 Chronic gout, unspecified, without tophus (tophi): Secondary | ICD-10-CM | POA: Diagnosis not present

## 2016-05-21 DIAGNOSIS — N184 Chronic kidney disease, stage 4 (severe): Secondary | ICD-10-CM

## 2016-05-21 DIAGNOSIS — M7542 Impingement syndrome of left shoulder: Secondary | ICD-10-CM | POA: Diagnosis not present

## 2016-05-21 MED ORDER — LIDOCAINE HCL 1 % IJ SOLN
1.0000 mL | INTRAMUSCULAR | Status: AC | PRN
  Administered 2016-05-21: 1 mL

## 2016-05-21 MED ORDER — TRIAMCINOLONE ACETONIDE 40 MG/ML IJ SUSP
40.0000 mg | INTRAMUSCULAR | Status: AC | PRN
  Administered 2016-05-21: 40 mg via INTRA_ARTICULAR

## 2016-05-21 NOTE — Patient Instructions (Signed)
Shoulder Exercises Ask your health care provider which exercises are safe for you. Do exercises exactly as told by your health care provider and adjust them as directed. It is normal to feel mild stretching, pulling, tightness, or discomfort as you do these exercises, but you should stop right away if you feel sudden pain or your pain gets worse.Do not begin these exercises until told by your health care provider. RANGE OF MOTION EXERCISES  These exercises warm up your muscles and joints and improve the movement and flexibility of your shoulder. These exercises also help to relieve pain, numbness, and tingling. These exercises involve stretching your injured shoulder directly. Exercise A: Pendulum   1. Stand near a wall or a surface that you can hold onto for balance. 2. Bend at the waist and let your left / right arm hang straight down. Use your other arm to support you. Keep your back straight and do not lock your knees. 3. Relax your left / right arm and shoulder muscles, and move your hips and your trunk so your left / right arm swings freely. Your arm should swing because of the motion of your body, not because you are using your arm or shoulder muscles. 4. Keep moving your body so your arm swings in the following directions, as told by your health care provider:  Side to side.  Forward and backward.  In clockwise and counterclockwise circles. 5. Continue each motion for __________ seconds, or for as long as told by your health care provider. 6. Slowly return to the starting position. Repeat __________ times. Complete this exercise __________ times a day. Exercise B:Flexion, Standing   1. Stand and hold a broomstick, a cane, or a similar object. Place your hands a little more than shoulder-width apart on the object. Your left / right hand should be palm-up, and your other hand should be palm-down. 2. Keep your elbow straight and keep your shoulder muscles relaxed. Push the stick down  with your healthy arm to raise your left / right arm in front of your body, and then over your head until you feel a stretch in your shoulder.  Avoid shrugging your shoulder while you raise your arm. Keep your shoulder blade tucked down toward the middle of your back. 3. Hold for __________ seconds. 4. Slowly return to the starting position. Repeat __________ times. Complete this exercise __________ times a day. Exercise C: Abduction, Standing  1. Stand and hold a broomstick, a cane, or a similar object. Place your hands a little more than shoulder-width apart on the object. Your left / right hand should be palm-up, and your other hand should be palm-down. 2. While keeping your elbow straight and your shoulder muscles relaxed, push the stick across your body toward your left / right side. Raise your left / right arm to the side of your body and then over your head until you feel a stretch in your shoulder.  Do not raise your arm above shoulder height, unless your health care provider tells you to do that.  Avoid shrugging your shoulder while you raise your arm. Keep your shoulder blade tucked down toward the middle of your back. 3. Hold for __________ seconds. 4. Slowly return to the starting position. Repeat __________ times. Complete this exercise __________ times a day. Exercise D:Internal Rotation   1. Place your left / right hand behind your back, palm-up. 2. Use your other hand to dangle an exercise band, a towel, or a similar object over your   shoulder. Grasp the band with your left / right hand so you are holding onto both ends. 3. Gently pull up on the band until you feel a stretch in the front of your left / right shoulder.  Avoid shrugging your shoulder while you raise your arm. Keep your shoulder blade tucked down toward the middle of your back. 4. Hold for __________ seconds. 5. Release the stretch by letting go of the band and lowering your hands. Repeat __________ times.  Complete this exercise __________ times a day. STRETCHING EXERCISES  These exercises warm up your muscles and joints and improve the movement and flexibility of your shoulder. These exercises also help to relieve pain, numbness, and tingling. These exercises are done using your healthy shoulder to help stretch the muscles of your injured shoulder. Exercise E: Corner Stretch (External Rotation and Abduction)   1. Stand in a doorway with one of your feet slightly in front of the other. This is called a staggered stance. If you cannot reach your forearms to the door frame, stand facing a corner of a room. 2. Choose one of the following positions as told by your health care provider:  Place your hands and forearms on the door frame above your head.  Place your hands and forearms on the door frame at the height of your head.  Place your hands on the door frame at the height of your elbows. 3. Slowly move your weight onto your front foot until you feel a stretch across your chest and in the front of your shoulders. Keep your head and chest upright and keep your abdominal muscles tight. 4. Hold for __________ seconds. 5. To release the stretch, shift your weight to your back foot. Repeat __________ times. Complete this stretch __________ times a day. Exercise F:Extension, Standing  1. Stand and hold a broomstick, a cane, or a similar object behind your back.  Your hands should be a little wider than shoulder-width apart.  Your palms should face away from your back. 2. Keeping your elbows straight and keeping your shoulder muscles relaxed, move the stick away from your body until you feel a stretch in your shoulder.  Avoid shrugging your shoulders while you move the stick. Keep your shoulder blade tucked down toward the middle of your back. 3. Hold for __________ seconds. 4. Slowly return to the starting position. Repeat __________ times. Complete this exercise __________ times a  day. STRENGTHENING EXERCISES  These exercises build strength and endurance in your shoulder. Endurance is the ability to use your muscles for a long time, even after they get tired. Exercise G:External Rotation   1. Sit in a stable chair without armrests. 2. Secure an exercise band at elbow height on your left / right side. 3. Place a soft object, such as a folded towel or a small pillow, between your left / right upper arm and your body to move your elbow a few inches away (about 10 cm) from your side. 4. Hold the end of the band so it is tight and there is no slack. 5. Keeping your elbow pressed against the soft object, move your left / right forearm out, away from your abdomen. Keep your body steady so only your forearm moves. 6. Hold for __________ seconds. 7. Slowly return to the starting position. Repeat __________ times. Complete this exercise __________ times a day. Exercise H:Shoulder Abduction   1. Sit in a stable chair without armrests, or stand. 2. Hold a __________ weight in your left /   right hand, or hold an exercise band with both hands. 3. Start with your arms straight down and your left / right palm facing in, toward your body. 4. Slowly lift your left / right hand out to your side. Do not lift your hand above shoulder height unless your health care provider tells you that this is safe.  Keep your arms straight.  Avoid shrugging your shoulder while you do this movement. Keep your shoulder blade tucked down toward the middle of your back. 5. Hold for __________ seconds. 6. Slowly lower your arm, and return to the starting position. Repeat __________ times. Complete this exercise __________ times a day. Exercise I:Shoulder Extension  1. Sit in a stable chair without armrests, or stand. 2. Secure an exercise band to a stable object in front of you where it is at shoulder height. 3. Hold one end of the exercise band in each hand. Your palms should face each  other. 4. Straighten your elbows and lift your hands up to shoulder height. 5. Step back, away from the secured end of the exercise band, until the band is tight and there is no slack. 6. Squeeze your shoulder blades together as you pull your hands down to the sides of your thighs. Stop when your hands are straight down by your sides. Do not let your hands go behind your body. 7. Hold for __________ seconds. 8. Slowly return to the starting position. Repeat __________ times. Complete this exercise __________ times a day. Exercise J:Standing Shoulder Row  1. Sit in a stable chair without armrests, or stand. 2. Secure an exercise band to a stable object in front of you so it is at waist height. 3. Hold one end of the exercise band in each hand. Your palms should be in a thumbs-up position. 4. Bend each of your elbows to an "L" shape (about 90 degrees) and keep your upper arms at your sides. 5. Step back until the band is tight and there is no slack. 6. Slowly pull your elbows back behind you. 7. Hold for __________ seconds. 8. Slowly return to the starting position. Repeat __________ times. Complete this exercise __________ times a day. Exercise K:Shoulder Press-Ups   1. Sit in a stable chair that has armrests. Sit upright, with your feet flat on the floor. 2. Put your hands on the armrests so your elbows are bent and your fingers are pointing forward. Your hands should be about even with the sides of your body. 3. Push down on the armrests and use your arms to lift yourself off of the chair. Straighten your elbows and lift yourself up as much as you comfortably can.  Move your shoulder blades down, and avoid letting your shoulders move up toward your ears.  Keep your feet on the ground. As you get stronger, your feet should support less of your body weight as you lift yourself up. 4. Hold for __________ seconds. 5. Slowly lower yourself back into the chair. Repeat __________ times.  Complete this exercise __________ times a day. Exercise L: Wall Push-Ups   1. Stand so you are facing a stable wall. Your feet should be about one arm-length away from the wall. 2. Lean forward and place your palms on the wall at shoulder height. 3. Keep your feet flat on the floor as you bend your elbows and lean forward toward the wall. 4. Hold for __________ seconds. 5. Straighten your elbows to push yourself back to the starting position. Repeat __________ times. Complete   this exercise __________ times a day. This information is not intended to replace advice given to you by your health care provider. Make sure you discuss any questions you have with your health care provider. Document Released: 11/14/2004 Document Revised: 09/25/2015 Document Reviewed: 09/11/2014 [employer] Interactive Patient Education  2017 [employer]  

## 2016-06-13 DIAGNOSIS — R351 Nocturia: Secondary | ICD-10-CM | POA: Diagnosis not present

## 2016-06-13 DIAGNOSIS — N401 Enlarged prostate with lower urinary tract symptoms: Secondary | ICD-10-CM | POA: Diagnosis not present

## 2016-08-09 DIAGNOSIS — H2513 Age-related nuclear cataract, bilateral: Secondary | ICD-10-CM | POA: Diagnosis not present

## 2016-08-09 DIAGNOSIS — H52221 Regular astigmatism, right eye: Secondary | ICD-10-CM | POA: Diagnosis not present

## 2016-08-09 DIAGNOSIS — I10 Essential (primary) hypertension: Secondary | ICD-10-CM | POA: Diagnosis not present

## 2016-08-09 DIAGNOSIS — H26053 Posterior subcapsular polar infantile and juvenile cataract, bilateral: Secondary | ICD-10-CM | POA: Diagnosis not present

## 2016-08-09 DIAGNOSIS — H524 Presbyopia: Secondary | ICD-10-CM | POA: Diagnosis not present

## 2016-08-09 DIAGNOSIS — H43813 Vitreous degeneration, bilateral: Secondary | ICD-10-CM | POA: Diagnosis not present

## 2016-08-09 DIAGNOSIS — H5213 Myopia, bilateral: Secondary | ICD-10-CM | POA: Diagnosis not present

## 2016-08-09 DIAGNOSIS — H53022 Refractive amblyopia, left eye: Secondary | ICD-10-CM | POA: Diagnosis not present

## 2016-08-16 DIAGNOSIS — Z114 Encounter for screening for human immunodeficiency virus [HIV]: Secondary | ICD-10-CM | POA: Diagnosis not present

## 2016-08-16 DIAGNOSIS — Z992 Dependence on renal dialysis: Secondary | ICD-10-CM | POA: Diagnosis not present

## 2016-08-16 DIAGNOSIS — N028 Recurrent and persistent hematuria with other morphologic changes: Secondary | ICD-10-CM | POA: Diagnosis not present

## 2016-08-16 DIAGNOSIS — Z1159 Encounter for screening for other viral diseases: Secondary | ICD-10-CM | POA: Diagnosis not present

## 2016-08-16 DIAGNOSIS — Z7682 Awaiting organ transplant status: Secondary | ICD-10-CM | POA: Diagnosis not present

## 2016-08-16 DIAGNOSIS — Z125 Encounter for screening for malignant neoplasm of prostate: Secondary | ICD-10-CM | POA: Diagnosis not present

## 2016-08-16 DIAGNOSIS — Z01818 Encounter for other preprocedural examination: Secondary | ICD-10-CM | POA: Diagnosis not present

## 2016-08-16 DIAGNOSIS — Z7289 Other problems related to lifestyle: Secondary | ICD-10-CM | POA: Diagnosis not present

## 2016-08-16 DIAGNOSIS — N186 End stage renal disease: Secondary | ICD-10-CM | POA: Diagnosis not present

## 2016-08-16 DIAGNOSIS — I10 Essential (primary) hypertension: Secondary | ICD-10-CM | POA: Diagnosis not present

## 2016-10-28 ENCOUNTER — Other Ambulatory Visit: Payer: Self-pay | Admitting: Nephrology

## 2016-10-28 ENCOUNTER — Ambulatory Visit
Admission: RE | Admit: 2016-10-28 | Discharge: 2016-10-28 | Disposition: A | Discharge status: Home / Self Care | Payer: Medicare Other | Source: Ambulatory Visit | Attending: Nephrology | Admitting: Nephrology

## 2016-10-28 DIAGNOSIS — R05 Cough: Secondary | ICD-10-CM

## 2016-10-28 DIAGNOSIS — R059 Cough, unspecified: Secondary | ICD-10-CM

## 2016-11-10 NOTE — Progress Notes (Signed)
Office Visit Note  Patient: Colin Mcfarland             Date of Birth: 1965/03/09           MRN: [id number]             PCP: [name], MD Referring: [name], MD Visit Date: 11/21/2016 Occupation: @GUAROCC @    Subjective:  Knee pain   History of Present Illness: Colin Mcfarland is a 51 y.o. male with osteoarthritis and gouty arthropathy. He states she's not had a flare of gout since the last visit. He had some discomfort in his left knee joint which is gradually getting better. He has underlying osteoarthritis which causes discomfort. He still have some limitation with range of motion of his left shoulder although the range of motion has improved.  Activities of Daily Living:  Patient reports morning stiffness for 0 minute.   Patient Denies nocturnal pain.  Difficulty dressing/grooming: Denies Difficulty climbing stairs: Denies Difficulty getting out of chair: Denies Difficulty using hands for taps, buttons, cutlery, and/or writing: Denies   Review of Systems  Constitutional: Negative for fatigue, night sweats and weakness ( ).  HENT: Positive for mouth dryness. Negative for mouth sores and nose dryness.   Eyes: Negative for redness and dryness.  Respiratory: Negative for shortness of breath and difficulty breathing.   Cardiovascular: Negative for chest pain, palpitations, hypertension, irregular heartbeat and swelling in legs/feet.  Gastrointestinal: Negative for constipation and diarrhea.  Endocrine: Negative for increased urination.  Musculoskeletal: Positive for arthralgias and joint pain. Negative for joint swelling, myalgias, muscle weakness, morning stiffness, muscle tenderness and myalgias.  Skin: Negative for color change, rash, hair loss, nodules/bumps, skin tightness, ulcers and sensitivity to sunlight.  Allergic/Immunologic: Negative for susceptible to infections.  Neurological: Negative for dizziness, fainting, memory loss and night sweats.  Hematological:  Negative for swollen glands.  Psychiatric/Behavioral: Negative for sleep disturbance.    PMFS History:  Patient Active Problem List   Diagnosis Date Noted  . GERD (gastroesophageal reflux disease) 11/22/2015  . Shoulder impingement, left 11/22/2015  . Osteoarthritis of both feet 11/22/2015  . Osteoarthritis of both knees 11/22/2015  . 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  . Pain of left upper extremity 07/21/2013  . 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 Murmur 04/12/2013  . Syncope 04/12/2013  . Chronic kidney disease (CKD), stage IV (severe) ([facility]) 03/02/2013  . End stage renal disease ([place]) 02/25/2012  . HYPOTHYROIDISM 01/14/2007  . Gout 01/14/2007  . HYPERTENSION 01/14/2007  . GASTRITIS, CHRONIC 01/14/2007  . IGA NEPHROPATHY 01/14/2007  . OTHER DYSPHAGIA 01/14/2007  . DIARRHEA 01/14/2007  . HELICOBACTER PYLORI INFECTION, HX OF 01/14/2007    Past Medical History:  Diagnosis Date  . Anemia   . Arthritis   . 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 Chronic kidney disease   . Eczema    hx: of  . GERD (gastroesophageal reflux disease)   . Hypertension   . Hypothyroidism   . 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  . Osteoarthritis of both feet 11/22/2015  . Osteoarthritis of both knees 11/22/2015  . Pneumonia   . Shoulder impingement, left 11/22/2015    Family History  Problem Relation Age of Onset  . Diabetes Father   . Hypertension Father   . Diabetes Brother    Past Surgical History:  Procedure Laterality Date  . HEMORRHOID SURGERY    . RENAL BIOPSY    . SHOULDER SURGERY  Social History   Social History Narrative  . Not on file     Objective: Vital Signs: BP (!) 152/84 (BP Location: Left Arm, Patient Position: Sitting, Cuff Size: Normal)   Pulse 65   Ht 5\' 8"  (1.727 m)   Wt 169 lb (76.7 kg)   BMI 25.70 kg/m    Physical Exam  Constitutional: He is oriented to person, place, and time. He  appears well-developed and well-nourished.  HENT:  Head: Normocephalic and atraumatic.  Eyes: Conjunctivae and EOM are normal. Pupils are equal, round, and reactive to light.  Neck: Normal range of motion. Neck supple.  Cardiovascular: Normal rate, regular rhythm and normal heart sounds.  Pulmonary/Chest: Effort normal and breath sounds normal.  Abdominal: Soft. Bowel sounds are normal.  Neurological: He is alert and oriented to person, place, and time.  Skin: Skin is warm and dry. Capillary refill takes less than 2 seconds.  Psychiatric: He has a normal mood and affect. His behavior is normal.  Nursing note and vitals reviewed.    Musculoskeletal Exam: C-spine good range of motion. He has some discomfort and some limitation with range of motion of his left shoulder. Elbow joints wrist joint MCPs PIPs DIPs with good range of motion with no synovitis. Hip joints knee joints ankles MTPs PIPs with good range of motion with no synovitis.  CDAI Exam: No CDAI exam completed.    Investigation: No additional findings.Uric acid: 12/15/2015 3.5 CBC Latest Ref Rng & Units 12/15/2015 10/21/2014 09/23/2014  WBC 3.8 - 10.8 K/uL 8.7 6.8 7.2  Hemoglobin 13.2 - 17.1 g/dL 11.0(L) 10.1(L) 9.8(L)  Hematocrit 38.5 - 50.0 % 33.1(L) 30.7(L) 30.6(L)  Platelets 140 - 400 K/uL 207 153 116(L)   CMP Latest Ref Rng & Units 12/15/2015 03/18/2013 05/07/2010  Glucose 65 - 99 mg/dL 134(H) 91 124(H)  BUN 7 - 25 mg/dL 42(H) - 47(H)  Creatinine 0.70 - 1.33 mg/dL 9.49(H) - 3.53(H)  Sodium 135 - 146 mmol/L 133(L) 141 140  Potassium 3.5 - 5.3 mmol/L 4.4 4.3 4.3  Chloride 98 - 110 mmol/L 93(L) - 110  CO2 20 - 31 mmol/L 23 - 21  Calcium 8.6 - 10.3 mg/dL 9.8 - 9.3  Total Protein 6.1 - 8.1 g/dL 6.8 - -  Total Bilirubin 0.2 - 1.2 mg/dL 0.5 - -  Alkaline Phos 40 - 115 U/L 51 - -  AST 10 - 35 U/L 26 - -  ALT 9 - 46 U/L 21 - -    Imaging: Dg Chest 2 View  Result Date: 10/28/2016 CLINICAL DATA:  Cough, fever. EXAM: CHEST  2  VIEW COMPARISON:  Radiographs of May 17, 2016. FINDINGS: The heart size and mediastinal contours are within normal limits. Both lungs are clear. No pneumothorax or pleural effusion is noted. The visualized skeletal structures are unremarkable. IMPRESSION: No active cardiopulmonary disease. Electronically Signed   By: Marijo Conception, M.D.   On: 10/28/2016 12:36    Speciality Comments: No specialty comments available.    Procedures:  No procedures performed Allergies: Uloric [febuxostat] and Sulfa antibiotics   Assessment / Plan:     Visit Diagnoses: Idiopathic chronic gout of multiple sites without tophus - On Allopurinol 100 mg po qd. He takes colchicine only on when necessary basis. He has not had a gout flare in long time. His uric acid was within normal limits. We do not have any recent labs on him. He's been getting labs to nephrologist. I've advised him to send labs from his next  visit which should include CBC, CMP with GFR and uric acid.  Primary osteoarthritis of both knees - chondromalacia patella   Primary osteoarthritis of both feet pain bilateral feet.  Shoulder impingement, left - He has decreased range of motion and discomfort. The range of motion has improved after the cortisone injection. I offered physical therapy but he declined. He states he does have some exercises at home we will continue to do them at this point.  End stage renal disease ([facility]) - He awaits kidney transplant. He is followed up with nephrology.  Chronic kidney disease (CKD), stage IV (severe) ([facility])  History of gastroesophageal reflux (GERD)  History of hypertension : Systolic blood pressure is a still elevated.   Orders: No orders of the defined types were placed in this encounter.  No orders of the defined types were placed in this encounter.     Follow-Up Instructions: Return in about 6 months (around 05/21/2017) for Osteoarthritis, Gout.   Bo Merino, MD  Note - This record has been  created using Editor, commissioning.  Chart creation errors have been sought, but may not always  have been located. Such creation errors do not reflect on  the standard of medical care.

## 2016-11-21 ENCOUNTER — Ambulatory Visit (INDEPENDENT_AMBULATORY_CARE_PROVIDER_SITE_OTHER): Payer: 59 | Admitting: Rheumatology

## 2016-11-21 ENCOUNTER — Encounter: Payer: Self-pay | Admitting: Rheumatology

## 2016-11-21 VITALS — BP 152/84 | HR 65 | Ht 68.0 in | Wt 169.0 lb

## 2016-11-21 DIAGNOSIS — M1A09X0 Idiopathic chronic gout, multiple sites, without tophus (tophi): Secondary | ICD-10-CM | POA: Diagnosis not present

## 2016-11-21 DIAGNOSIS — M19071 Primary osteoarthritis, right ankle and foot: Secondary | ICD-10-CM

## 2016-11-21 DIAGNOSIS — Z8679 Personal history of other diseases of the circulatory system: Secondary | ICD-10-CM | POA: Diagnosis not present

## 2016-11-21 DIAGNOSIS — Z8719 Personal history of other diseases of the digestive system: Secondary | ICD-10-CM | POA: Diagnosis not present

## 2016-11-21 DIAGNOSIS — M7542 Impingement syndrome of left shoulder: Secondary | ICD-10-CM

## 2016-11-21 DIAGNOSIS — M170 Bilateral primary osteoarthritis of knee: Secondary | ICD-10-CM | POA: Diagnosis not present

## 2016-11-21 DIAGNOSIS — N184 Chronic kidney disease, stage 4 (severe): Secondary | ICD-10-CM

## 2016-11-21 DIAGNOSIS — M19072 Primary osteoarthritis, left ankle and foot: Secondary | ICD-10-CM | POA: Diagnosis not present

## 2016-11-21 DIAGNOSIS — N186 End stage renal disease: Secondary | ICD-10-CM | POA: Diagnosis not present

## 2016-11-21 NOTE — Patient Instructions (Signed)
You need CBC, CMP with GFR, uric acid. These have it drawn at your nephrology visit.

## 2016-11-25 ENCOUNTER — Other Ambulatory Visit: Payer: Self-pay | Admitting: Rheumatology

## 2016-11-25 NOTE — Telephone Encounter (Addendum)
Last Visit: 11/21/16 Next Visit: 05/27/17 Labs: 12/15/15 CBC with differential is within normal limits except mild anemia. Patient is seeing nephrologist this week and will have labs done and sent to our office.  Okay to send 30 day supply Colchicine?

## 2016-11-26 NOTE — Telephone Encounter (Signed)
ok 

## 2017-02-13 DIAGNOSIS — N2589 Other disorders resulting from impaired renal tubular function: Secondary | ICD-10-CM | POA: Diagnosis not present

## 2017-02-13 DIAGNOSIS — Z992 Dependence on renal dialysis: Secondary | ICD-10-CM | POA: Diagnosis not present

## 2017-02-13 DIAGNOSIS — N186 End stage renal disease: Secondary | ICD-10-CM | POA: Diagnosis not present

## 2017-02-14 DIAGNOSIS — Z992 Dependence on renal dialysis: Secondary | ICD-10-CM | POA: Diagnosis not present

## 2017-02-14 DIAGNOSIS — N2589 Other disorders resulting from impaired renal tubular function: Secondary | ICD-10-CM | POA: Diagnosis not present

## 2017-02-14 DIAGNOSIS — N186 End stage renal disease: Secondary | ICD-10-CM | POA: Diagnosis not present

## 2017-03-14 DIAGNOSIS — N2589 Other disorders resulting from impaired renal tubular function: Secondary | ICD-10-CM | POA: Diagnosis not present

## 2017-03-14 DIAGNOSIS — Z992 Dependence on renal dialysis: Secondary | ICD-10-CM | POA: Diagnosis not present

## 2017-03-14 DIAGNOSIS — N186 End stage renal disease: Secondary | ICD-10-CM | POA: Diagnosis not present

## 2017-04-01 DIAGNOSIS — R509 Fever, unspecified: Secondary | ICD-10-CM | POA: Diagnosis not present

## 2017-04-16 ENCOUNTER — Telehealth: Payer: Self-pay

## 2017-04-16 NOTE — Telephone Encounter (Signed)
SENT REFERRAL TO SCHEDULING 

## 2017-04-21 DIAGNOSIS — N186 End stage renal disease: Secondary | ICD-10-CM | POA: Diagnosis not present

## 2017-04-21 DIAGNOSIS — Z6824 Body mass index (BMI) 24.0-24.9, adult: Secondary | ICD-10-CM | POA: Diagnosis not present

## 2017-04-21 DIAGNOSIS — J849 Interstitial pulmonary disease, unspecified: Secondary | ICD-10-CM | POA: Diagnosis not present

## 2017-04-21 DIAGNOSIS — R509 Fever, unspecified: Secondary | ICD-10-CM | POA: Diagnosis not present

## 2017-04-21 DIAGNOSIS — I509 Heart failure, unspecified: Secondary | ICD-10-CM | POA: Diagnosis not present

## 2017-04-21 NOTE — Progress Notes (Signed)
Subjective:    Patient ID: Colin Mcfarland, male    DOB: [birth year], 52 y.o.   MRN: [id number]  Chief Complaint  Patient presents with  . New Patient (Initial Visit)    completed antibiotics for chest infection yesterday, having chills/aches/temperatures nightly    HPI:  Colin Mcfarland is a 52 y.o. male with a previous medical history of end-stage renal disease on dialysis, hypothyroidism, and hypertension who presents today for initial evaluation and treatment of recurrent fevers.   Experiencing the associated symptoms of recurrent fevers, chills, and body aches that were going on for about 2 weeks and were treated with the modifying factors of cefdinir which did not help very much and then a 7 days course of doxycyline. Now feeling better, but continues to have cough and some shortness of breath with exertion. Previous chest x-ray not available for viewing but patient indicates pneumonia. Influenza at the time was negative. He does HD at home and had a temperature of 99.7. He currently receives dialysis from a fistula in his left upper arm. Denies any nausea, vomiting, diarrhea or constipation. No recent weight loss or night sweats.     Allergies  Allergen Reactions  . Blue Dyes (Parenteral) Hives  . Uloric [Febuxostat] Other (See Comments)    Joints stiffened  . Sulfa Antibiotics Rash      Outpatient Medications Prior to Visit  Medication Sig Dispense Refill  . allopurinol (ZYLOPRIM) 100 MG tablet Take 100 mg by mouth at bedtime.     Marland Kitchen amLODipine (NORVASC) 10 MG tablet Take 5 mg by mouth daily.     Marland Kitchen amoxicillin (AMOXIL) 500 MG capsule Take by mouth. With Dental procedures    . calcium acetate (PHOSLO) 667 MG capsule Take by mouth.    . calcium carbonate (TUMS - DOSED IN MG ELEMENTAL CALCIUM) 500 MG chewable tablet Chew 1 tablet by mouth daily.    . cinacalcet (SENSIPAR) 30 MG tablet Take 30 mg by mouth every other day.     . ferrous sulfate 325 (65 FE) MG tablet Take 325 mg by mouth  at bedtime.    . furosemide (LASIX) 80 MG tablet     . heparin 1000 UNIT/ML injection ADMINISTER AS DIRECTED  99  . HYDROcodone-acetaminophen (NORCO/VICODIN) 5-325 MG per tablet Take 1 tablet by mouth every 6 (six) hours as needed for moderate pain (pain/gout flare ups). 30 tablet 0  . labetalol (NORMODYNE) 200 MG tablet Take 200 mg by mouth 3 (three) times daily.     . lansoprazole (PREVACID) 30 MG capsule Take 30 mg by mouth at bedtime.     Marland Kitchen levothyroxine (SYNTHROID, LEVOTHROID) 125 MCG tablet Take 125 mcg by mouth daily.    Marland Kitchen losartan (COZAAR) 25 MG tablet     . multivitamin (RENA-VIT) TABS tablet Take 1 tablet by mouth daily.    . polyethylene glycol powder (MIRALAX) powder Take 1 Container daily by mouth.    . sildenafil (VIAGRA) 100 MG tablet     . tamsulosin (FLOMAX) 0.4 MG CAPS capsule     . fluocinonide cream (LIDEX) 5.00 % Apply 1 application topically daily as needed (rash on feet).    . cefdinir (OMNICEF) 300 MG capsule     . clobetasol ointment (TEMOVATE) 0.05 %     . doxycycline (ADOXA) 100 MG tablet     . doxycycline (VIBRAMYCIN) 100 MG capsule     . ALPRAZolam (XANAX) 0.25 MG tablet Take 0.25 mg by mouth as needed for anxiety.    Marland Kitchen  colchicine 0.6 MG tablet TAKE ONE-HALF TABLET BY  MOUTH DAILY 15 tablet 0  . fenofibrate (TRICOR) 145 MG tablet Take 145 mg by mouth at bedtime.     Marland Kitchen omega-3 acid ethyl esters (LOVAZA) 1 G capsule Take 2 g by mouth at bedtime.     . predniSONE (DELTASONE) 5 MG tablet 4po qAM x 2 days, ; 3po qAM x 2 days, ; 2po qAM x 2 days,; 1po qAM x 2 days,; then stop.; disp 20 pills w/ no refills. (Patient not taking: Reported on 11/21/2016) 20 tablet 0   No facility-administered medications prior to visit.      Past Medical History:  Diagnosis Date  . Anemia   . Arthritis   . 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 Chronic kidney disease   . Eczema    hx: of  . GERD (gastroesophageal reflux disease)   . Hypertension   . Hypothyroidism   . IgA  nephropathy 11/22/2015  . Osteoarthritis of both feet 11/22/2015  . Osteoarthritis of both knees 11/22/2015  . Pneumonia   . Shoulder impingement, left 11/22/2015      Past Surgical History:  Procedure Laterality Date  . AV FISTULA PLACEMENT Left 03/18/2013   Procedure: ARTERIOVENOUS (AV) FISTULA CREATION- BRACHIOCEPHALIC;  Surgeon: Mal Misty, MD;  Location: [facility];  Service: Vascular;  Laterality: Left;  . HEMORRHOID SURGERY    . RENAL BIOPSY    . SHOULDER SURGERY        Family History  Problem Relation Age of Onset  . Diabetes Father   . Hypertension Father   . Diabetes Brother       Social History   Socioeconomic History  . Marital status: Married    Spouse name: Not on file  . Number of children: Not on file  . Years of education: Not on file  . Highest education level: Not on file  Occupational History  . Not on file  Social Needs  . Financial resource strain: Not on file  . Food insecurity:    Worry: Not on file    Inability: Not on file  . Transportation needs:    Medical: Not on file    Non-medical: Not on file  Tobacco Use  . Smoking status: Never Smoker  . Smokeless tobacco: Never Used  Substance and Sexual Activity  . Alcohol use: No  . Drug use: No  . Sexual activity: Not on file  Lifestyle  . Physical activity:    Days per week: Not on file    Minutes per session: Not on file  . Stress: Not on file  Relationships  . Social connections:    Talks on phone: Not on file    Gets together: Not on file    Attends religious service: Not on file    Active member of club or organization: Not on file    Attends meetings of clubs or organizations: Not on file    Relationship status: Not on file  . Intimate partner violence:    Fear of current or ex partner: Not on file    Emotionally abused: Not on file    Physically abused: Not on file    Forced sexual activity: Not on file  Other Topics Concern  . Not on file  Social History Narrative  . Not  on file     Review of Systems  Constitutional: Positive for chills, fatigue and fever. Negative for unexpected weight change.  Respiratory: Positive for cough and shortness of  breath. Negative for chest tightness and wheezing.   Cardiovascular: Negative for chest pain.  Gastrointestinal: Negative for constipation, diarrhea, nausea and vomiting.       Objective:    BP (!) 149/82   Pulse 76   Temp 98.2 F (36.8 C) (Oral)   Ht 5\' 8"  (1.727 m)   Wt 163 lb (73.9 kg)   BMI 24.78 kg/m  Nursing note and vital signs reviewed.  Physical Exam  Constitutional: He is oriented to person, place, and time. He appears well-developed and well-nourished. No distress.  Cardiovascular: Normal rate, regular rhythm and intact distal pulses.  Murmur heard. Fistula without evidence of infection with bruit and thrill present.   Pulmonary/Chest: Effort normal. No stridor. No respiratory distress. He has no wheezes. He has rales. He exhibits no tenderness.  Abdominal: He exhibits no distension. There is no tenderness.  Neurological: He is alert and oriented to person, place, and time.  Skin: Skin is warm and dry.  Psychiatric: He has a normal mood and affect. His behavior is normal. Judgment and thought content normal.        Assessment & Plan:   Problem List Items Addressed This Visit      Respiratory   Pneumonia - Primary    Mr. Pring was recently diagnosed with pneumonia and completed cefdinir and doxycycline and now with improvement. He continues to have small elevated temperatures and denies weight loss or night sweats. He does have some rales in his bilateral lungs with concern for fluid versus infection. It does not appear he needs additional antibiotics. I will check his CBC and blood cultures today to ensure this is not underlying endocarditis, although unlikely. Recommend continued symptom management and follow up as needed.       Relevant Medications   cefdinir (OMNICEF) 300 MG capsule    doxycycline (VIBRAMYCIN) 100 MG capsule   doxycycline (ADOXA) 100 MG tablet   Other Relevant Orders   CBC w/Diff   Culture, blood (single)   Culture, blood (single)       I have discontinued Kalyan Sassone's omega-3 acid ethyl esters, fenofibrate, fluocinonide cream, ALPRAZolam, predniSONE, and colchicine. I am also having him maintain his labetalol, levothyroxine, allopurinol, lansoprazole, ferrous sulfate, HYDROcodone-acetaminophen, cinacalcet, amLODipine, calcium carbonate, multivitamin, amoxicillin, calcium acetate, tamsulosin, furosemide, losartan, polyethylene glycol powder, clobetasol ointment, cefdinir, doxycycline, doxycycline, heparin, and sildenafil.    Follow-up: Return if symptoms worsen or fail to improve.  Mauricio Po, [facility]

## 2017-04-28 ENCOUNTER — Ambulatory Visit (INDEPENDENT_AMBULATORY_CARE_PROVIDER_SITE_OTHER): Payer: 59 | Admitting: Family

## 2017-04-28 ENCOUNTER — Encounter: Payer: Self-pay | Admitting: Family

## 2017-04-28 ENCOUNTER — Other Ambulatory Visit: Payer: Self-pay

## 2017-04-28 VITALS — BP 149/82 | HR 76 | Temp 98.2°F | Ht 68.0 in | Wt 163.0 lb

## 2017-04-28 DIAGNOSIS — J189 Pneumonia, unspecified organism: Secondary | ICD-10-CM | POA: Diagnosis not present

## 2017-04-28 LAB — CBC WITH DIFFERENTIAL/PLATELET
BASOS PCT: 0.5 %
Basophils Absolute: 63 cells/uL (ref 0–200)
EOS ABS: 655 {cells}/uL — AB (ref 15–500)
Eosinophils Relative: 5.2 %
HCT: 29.1 % — ABNORMAL LOW (ref 38.5–50.0)
HEMOGLOBIN: 9.6 g/dL — AB (ref 13.2–17.1)
Lymphs Abs: 1096 cells/uL (ref 850–3900)
MCH: 28.4 pg (ref 27.0–33.0)
MCHC: 33 g/dL (ref 32.0–36.0)
MCV: 86.1 fL (ref 80.0–100.0)
MONOS PCT: 8 %
MPV: 10.7 fL (ref 7.5–12.5)
NEUTROS ABS: 9778 {cells}/uL — AB (ref 1500–7800)
Neutrophils Relative %: 77.6 %
PLATELETS: 200 10*3/uL (ref 140–400)
RBC: 3.38 10*6/uL — AB (ref 4.20–5.80)
RDW: 14.4 % (ref 11.0–15.0)
Total Lymphocyte: 8.7 %
WBC: 12.6 10*3/uL — AB (ref 3.8–10.8)
WBCMIX: 1008 {cells}/uL — AB (ref 200–950)

## 2017-04-28 NOTE — Assessment & Plan Note (Signed)
Colin Mcfarland was recently diagnosed with pneumonia and completed cefdinir and doxycycline and now with improvement. He continues to have small elevated temperatures and denies weight loss or night sweats. He does have some rales in his bilateral lungs with concern for fluid versus infection. It does not appear he needs additional antibiotics. I will check his CBC and blood cultures today to ensure this is not underlying endocarditis, although unlikely. Recommend continued symptom management and follow up as needed.

## 2017-04-28 NOTE — Patient Instructions (Signed)
Very nice to meet you.  It does not appear that you need any additional antibiotics at this time.  Please follow-up with the kidney doctors regarding fluid levels as you have some crackling in your lungs.  Continue to monitor your temperature as needed.  We will check your blood cultures today to ensure you do not have a bloodstream infection.  Follow-up with [place] as needed or if symptoms worsen or do not improve.

## 2017-05-05 ENCOUNTER — Encounter (INDEPENDENT_AMBULATORY_CARE_PROVIDER_SITE_OTHER): Payer: Self-pay

## 2017-05-05 LAB — CULTURE, BLOOD (SINGLE)
MICRO NUMBER:: 90460284
MICRO NUMBER:: 90460292
RESULT: NO GROWTH
Result:: NO GROWTH
SPECIMEN QUALITY: ADEQUATE
SPECIMEN QUALITY:: ADEQUATE

## 2017-05-14 ENCOUNTER — Encounter: Payer: Self-pay | Admitting: Rheumatology

## 2017-05-15 ENCOUNTER — Encounter: Payer: Self-pay | Admitting: Cardiovascular Disease

## 2017-05-15 ENCOUNTER — Ambulatory Visit (INDEPENDENT_AMBULATORY_CARE_PROVIDER_SITE_OTHER): Payer: 59 | Admitting: Cardiovascular Disease

## 2017-05-15 VITALS — BP 138/78 | HR 72 | Ht 68.0 in | Wt 166.0 lb

## 2017-05-15 DIAGNOSIS — I509 Heart failure, unspecified: Secondary | ICD-10-CM

## 2017-05-15 DIAGNOSIS — I10 Essential (primary) hypertension: Secondary | ICD-10-CM | POA: Diagnosis not present

## 2017-05-15 NOTE — Patient Instructions (Addendum)

## 2017-05-15 NOTE — Progress Notes (Signed)
Cardiology Office Note   Date:  05/15/2017   ID:  Colin Mcfarland, DOB Oct 04, 1965, MRN [id number]  PCP:  [name], MD  Cardiologist:   Jenkins Rouge, MD   No chief complaint on file.      History of Present Illness: Colin Mcfarland is a 52 y.o. male who presents for consultation for CHF. Referred by Avva Ravisanka. Last seen by me 4 years ago for bradycardia and near syncope during LUE fistula placement. History of CRF on dialysis Was on labatolol at time F/u ETT normal Last echo 04/14/13 EF normal 60-65% normal wall thickness and grade one diastolic   Has not felt well for weeks with myalgias low grade fevers. Rx for pneumonia with multiple antibiotics ? Component of interstitial edema and concern for murmur he feels last antibiotic made him better Recent blood cultures drawn by ID 04/28/17 no growth to date Dr Azzie Roup renal doctor has taken More fluid off him at dialysis He makes very little urine   Past Medical History:  Diagnosis Date  . Anemia   . Arthritis   . 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 Chronic kidney disease   . Eczema    hx: of  . GERD (gastroesophageal reflux disease)   . Hypertension   . Hypothyroidism   . 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  . Osteoarthritis of both feet 11/22/2015  . Osteoarthritis of both knees 11/22/2015  . Pneumonia   . Shoulder impingement, left 11/22/2015    Past Surgical History:  Procedure Laterality Date  . AV FISTULA PLACEMENT Left 03/18/2013   Procedure: ARTERIOVENOUS (AV) FISTULA CREATION- BRACHIOCEPHALIC;  Surgeon: Mal Misty, MD;  Location: [facility];  Service: Vascular;  Laterality: Left;  . HEMORRHOID SURGERY    . RENAL BIOPSY    . SHOULDER SURGERY       Current Outpatient Medications  Medication Sig Dispense Refill  . allopurinol (ZYLOPRIM) 100 MG tablet Take 100 mg by mouth at bedtime.     Marland Kitchen amLODipine (NORVASC) 10 MG tablet Take 5 mg by mouth daily.     Marland Kitchen amoxicillin (AMOXIL) 500 MG capsule Take by mouth. With Dental  procedures    . calcium carbonate (TUMS - DOSED IN MG ELEMENTAL CALCIUM) 500 MG chewable tablet Chew 1 tablet by mouth daily.    . cinacalcet (SENSIPAR) 30 MG tablet Take 30 mg by mouth every other day.     . clobetasol cream (TEMOVATE) 9.62 % Apply 1 application topically as directed.    . ferrous sulfate 325 (65 FE) MG tablet Take 325 mg by mouth at bedtime.    . furosemide (LASIX) 80 MG tablet Take 80 mg by mouth as directed.    . heparin 1000 UNIT/ML injection ADMINISTER AS DIRECTED  99  . HYDROcodone-acetaminophen (NORCO/VICODIN) 5-325 MG per tablet Take 1 tablet by mouth every 6 (six) hours as needed for moderate pain (pain/gout flare ups). 30 tablet 0  . labetalol (NORMODYNE) 200 MG tablet Take 200 mg by mouth 3 (three) times daily.     . lansoprazole (PREVACID) 30 MG capsule Take 30 mg by mouth at bedtime.     Marland Kitchen levothyroxine (SYNTHROID, LEVOTHROID) 125 MCG tablet Take 125 mcg by mouth daily.    Marland Kitchen losartan (COZAAR) 25 MG tablet Take 25 mg by mouth as directed.    . multivitamin (RENA-VIT) TABS tablet Take 1 tablet by mouth daily.    . polyethylene glycol powder (MIRALAX) powder Take 1 Container daily by mouth.    . sildenafil (VIAGRA)  100 MG tablet Take 100 mg by mouth daily as needed for erectile dysfunction.    . tamsulosin (FLOMAX) 0.4 MG CAPS capsule Take 0.4 mg by mouth as directed.     No current facility-administered medications for this visit.     Allergies:   Blue dyes (parenteral); Uloric [febuxostat]; and Sulfa antibiotics    Social History:  The patient  reports that he has never smoked. He has never used smokeless tobacco. He reports that he does not drink alcohol or use drugs.   Family History:  The patient's family history includes Diabetes in his brother and father; Hypertension in his father.    ROS:  Please see the history of present illness.   Otherwise, review of systems are positive for none.   All other systems are reviewed and negative.    PHYSICAL  EXAM: VS:  BP 138/78   Pulse 72   Ht 5\' 8"  (1.727 m)   Wt 166 lb (75.3 kg)   SpO2 97%   BMI 25.24 kg/m  , BMI Body mass index is 25.24 kg/m. Affect appropriate Healthy:  appears stated age 27: normal Neck supple with no adenopathy JVP normal no bruits no thyromegaly Lungs clear with no wheezing and good diaphragmatic motion Heart:  S1/S2 AV sclerosis  murmur, no rub, gallop or click PMI normal Abdomen: benighn, BS positve, no tenderness, no AAA no bruit.  No HSM or HJR Distal pulses intact with no bruits No edema Neuro non-focal Skin warm and dry No muscular weakness Huge fistula in LUE    EKG:  2015 SR rate 68 LAE normal ST segments 05/15/17 SR rate 65 LAE otherwise normal    Recent Labs: 04/28/2017: Hemoglobin 9.6; Platelets 200    Lipid Panel No results found for: CHOL, TRIG, HDL, CHOLHDL, VLDL, LDLCALC, LDLDIRECT    Wt Readings from Last 3 Encounters:  05/15/17 166 lb (75.3 kg)  04/28/17 163 lb (73.9 kg)  11/21/16 169 lb (76.7 kg)      Other studies Reviewed: Additional studies/ records that were reviewed today include: Notes from primary labs ECG 2015 along with old cardiology consult note 2015 ETT and echo .    ASSESSMENT AND PLAN:  1.  CHF:  Not clear of etiology f/u TTE EF was normal in 2016 2. HTN:  Well controlled.  Continue current medications and low sodium Dash type diet.   3. CRF:  Fistula is very large but no obvious steal f/u [place]  4. Murmur: due to shunt, flow anemia and likely AV sclerosis update TTE    Current medicines are reviewed at length with the patient today.  The patient does not have concerns regarding medicines.  The following changes have been made:  no change  Labs/ tests ordered today include: TTE   Orders Placed This Encounter  Procedures  . EKG 12-Lead  . ECHOCARDIOGRAM COMPLETE     Disposition:   FU with cardiology 3     Signed, Jenkins Rouge, MD  05/15/2017 4:45 PM    [facility]  [facility], [place], [place]  [address] Phone: [phone]; Fax: [phone]

## 2017-05-19 NOTE — Progress Notes (Addendum)
Office Visit Note  Patient: Colin Mcfarland             Date of Birth: [birth year]           MRN: [id number]             PCP: [name], MD Referring: [name], MD Visit Date: 05/27/2017 Occupation: @GUAROCC @    Subjective:  Right knee and right foot pain.   History of Present Illness: Colin Mcfarland is a 52 y.o. male with history of gout, osteoarthritis.  He states he developed pneumonia last month.  He was treated with antibiotics and had dialysis.  He also developed a rash all over his body for which he was seen by Dr. Wilhemina Bonito.  According to patient he was diagnosed with eczema and was treated with prednisone, topical agents and he has some residual hyperpigmentation now.  He has been having increased right knee joint pain for the last 1 month which has been gradually getting worse.  He has been also experiencing some pain in his right foot.  There is no joint swelling.  Left shoulder joint is better compared to last visit.  Activities of Daily Living:  Patient reports morning stiffness for 0 minute.   Patient Reports nocturnal pain.  Difficulty dressing/grooming: Denies Difficulty climbing stairs: Reports Difficulty getting out of chair: Reports Difficulty using hands for taps, buttons, cutlery, and/or writing: Denies   Review of Systems  Constitutional: Negative for fatigue and night sweats.  HENT: Negative for mouth sores, mouth dryness and nose dryness.   Eyes: Negative for redness and dryness.  Respiratory: Negative for shortness of breath and difficulty breathing.   Cardiovascular: Positive for hypertension. Negative for chest pain, palpitations, irregular heartbeat and swelling in legs/feet.  Gastrointestinal: Negative for constipation and diarrhea.  Endocrine: Negative for increased urination.  Musculoskeletal: Positive for arthralgias and joint pain. Negative for joint swelling, myalgias, muscle weakness, morning stiffness, muscle tenderness and myalgias.    Skin: Positive for rash. Negative for color change, hair loss, nodules/bumps, skin tightness, ulcers and sensitivity to sunlight.  Allergic/Immunologic: Negative for susceptible to infections.  Neurological: Negative for dizziness, fainting, memory loss, night sweats and weakness ( ).  Hematological: Negative for swollen glands.  Psychiatric/Behavioral: Positive for sleep disturbance. Negative for depressed mood. The patient is not nervous/anxious.        Nocturnal pain    PMFS History:  Patient Active Problem List   Diagnosis Date Noted  . Pneumonia 04/28/2017  . GERD (gastroesophageal reflux disease) 11/22/2015  . Shoulder impingement, left 11/22/2015  . Osteoarthritis of both feet 11/22/2015  . Osteoarthritis of both knees 11/22/2015  . 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  . Pain of left upper extremity 07/21/2013  . 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 Murmur 04/12/2013  . Syncope 04/12/2013  . Chronic kidney disease (CKD), stage IV (severe) ([place]) 03/02/2013  . End stage renal disease ([place]) 02/25/2012  . HYPOTHYROIDISM 01/14/2007  . Gout 01/14/2007  . HYPERTENSION 01/14/2007  . GASTRITIS, CHRONIC 01/14/2007  . IGA NEPHROPATHY 01/14/2007  . OTHER DYSPHAGIA 01/14/2007  . DIARRHEA 01/14/2007  . HELICOBACTER PYLORI INFECTION, HX OF 01/14/2007    Past Medical History:  Diagnosis Date  . Anemia   . Arthritis   . 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 Chronic kidney disease   . Eczema    hx: of  . GERD (gastroesophageal reflux disease)   . Hypertension   . Hypothyroidism   .  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  . Osteoarthritis of both feet 11/22/2015  . Osteoarthritis of both knees 11/22/2015  . Pneumonia   . Shoulder impingement, left 11/22/2015    Family History  Problem Relation Age of Onset  . Diabetes Father   . Hypertension Father   . Cancer Mother   . Diabetes Brother    Past Surgical History:   Procedure Laterality Date  . AV FISTULA PLACEMENT Left 03/18/2013   Procedure: ARTERIOVENOUS (AV) FISTULA CREATION- BRACHIOCEPHALIC;  Surgeon: Mal Misty, MD;  Location: [facility];  Service: Vascular;  Laterality: Left;  . HEMORRHOID SURGERY    . RENAL BIOPSY    . SHOULDER SURGERY     Social History   Social History Narrative  . Not on file     Objective: Vital Signs: BP 139/80 (BP Location: Right Arm, Patient Position: Sitting, Cuff Size: Normal)   Pulse 64   Resp 13   Ht 5\' 8"  (1.727 m)   Wt 163 lb (73.9 kg)   BMI 24.78 kg/m    Physical Exam  Constitutional: He is oriented to person, place, and time. He appears well-developed and well-nourished.  HENT:  Head: Normocephalic and atraumatic.  Eyes: Pupils are equal, round, and reactive to light. Conjunctivae and EOM are normal.  Neck: Normal range of motion. Neck supple.  Cardiovascular: Normal rate, regular rhythm and normal heart sounds.  Pulmonary/Chest: Effort normal and breath sounds normal.  Abdominal: Soft. Bowel sounds are normal.  Neurological: He is alert and oriented to person, place, and time.  Skin: Skin is warm and dry. Capillary refill takes less than 2 seconds. Rash noted.  Hyperpigmented macules on upper and lower extremities and trunk.  Psychiatric: He has a normal mood and affect. His behavior is normal.  Nursing note and vitals reviewed.    Musculoskeletal Exam: C-spine thoracic lumbar spine good range of motion.  Shoulder joints all the joints wrist joint MCPs PIPs DIPs were in good range of motion with no synovitis.  Good range of motion of bilateral hip joints without discomfort.  He has warmth swelling effusion in his right knee joint.  He has some swelling on dorsum of his right foot.  CDAI Exam: No CDAI exam completed.    Investigation: Findings:  May 14, 2017 CMP creatinine 8.22 phosphorus 6.9 uric acid 4.17 December 2015 uric acid 3.5 GFR 7, creatinine 9.49 Results of his recent labs  obtained verbally from dialysis center uric acid was 4.3. Imaging: Xr Foot 2 Views Right  Result Date: 05/27/2017 First MTP, PIP and DIP narrowing was noted.  No erosive changes were noted. Impression: These findings are consistent mild osteoarthritis of the foot.  Xr Knee 3 View Right  Result Date: 05/27/2017 Mild medial compartment narrowing was noted.  No chondrocalcinosis was noted.  Severe patellofemoral narrowing was noted. Impression: These findings are consistent with mild osteoarthritis and severe chondromalacia patella.   Speciality Comments: No specialty comments available.    Procedures:  Large Joint Inj: R knee on 05/27/2017 4:17 PM Indications: pain Details: 27 G 1.5 in needle, medial approach  Arthrogram: No  Medications: 40 mg triamcinolone acetonide 40 MG/ML; 3 mL lidocaine 1 % Aspirate: 35 mL Outcome: tolerated well, no immediate complications Procedure, treatment alternatives, risks and benefits explained, specific risks discussed. Consent was given by the patient. Immediately prior to procedure a time out was called to verify the correct patient, procedure, equipment, support staff and site/side marked as required. Patient was prepped and draped in the  usual sterile fashion.     Allergies: Blue dyes (parenteral); Uloric [febuxostat]; and Sulfa antibiotics   Assessment / Plan:     Visit Diagnoses: Idiopathic chronic gout of multiple sites without tophus - On Allopurinol 100 mg po qd. He takes colchicine only on when necessary basis.  Patient states that his gout has not been flaring.  I believe the recent knee joint effusion in the right foot swelling is due to gout flare.  Dietary modifications were discussed.  Primary osteoarthritis of both knees - chondromalacia patella -he does have chronic pain.  Effusion, right knee -he had moderate effusion in his right knee joint on examination.  After informed consent was obtained the right knee joint was aspirated and  injected.  The procedure as described above.  The synovial foot was sent for analysis.  Plan: Synovial cell count + diff, w/ crystals.  I will also give him a prednisone taper starting at 20 mg for 2 days, 15 for 2 days, 10 for 2 days and 5 for 2 days.  Chronic pain of right knee - Plan: XR KNEE 3 VIEW RIGHT.  The x-ray showed mild osteoarthritis and severe chondromalacia patella.  Pain in right foot - Plan: XR Foot 2 Views Right.  The x-ray was consistent with mild osteoarthritis of the foot.  Primary osteoarthritis of both feet-chronic pain  Shoulder impingement, left-he has some discomfort and limitation of range of motion.  End stage renal disease ([place]) - He awaits kidney transplant. He is followed up with nephrology.  Chronic kidney disease (CKD), stage IV (severe) ([facility])  Rash-patient reports having a rash all over his body which for which he was seen by Dr. Ronnald Ramp.  He was treated with prednisone and some topical agents.  He has only hyperpigmented macules currently.  I would like to obtain those records from Dr. Ronnald Ramp.  Patient will have them forwarded to [place].  History of hypertension-his blood pressure is under control today.  Have advised him to monitor blood pressure after the procedure.  History of gastroesophageal reflux (GERD)    Orders: Orders Placed This Encounter  Procedures  . Large Joint Inj: R knee  . XR Foot 2 Views Right  . XR KNEE 3 VIEW RIGHT  . Synovial cell count + diff, w/ crystals   Meds ordered this encounter  Medications  . predniSONE (DELTASONE) 5 MG tablet    Sig: Take 4 tablets x2 days, 3 tablets x2 days, 2 tablets x2 days, 1 tablet x2 days.    Dispense:  20 tablet    Refill:  0    Face-to-face time spent with patient was 30 minutes.>50% of time was spent in counseling and coordination of care.  Follow-Up Instructions: Return in about 6 months (around 11/27/2017) for Osteoarthritis, Gout.   Bo Merino, MD  Note - This record has been  created using Editor, commissioning.  Chart creation errors have been sought, but may not always  have been located. Such creation errors do not reflect on  the standard of medical care.

## 2017-05-23 ENCOUNTER — Other Ambulatory Visit: Payer: Self-pay

## 2017-05-23 ENCOUNTER — Ambulatory Visit (HOSPITAL_COMMUNITY): Payer: 59 | Attending: Cardiology

## 2017-05-23 DIAGNOSIS — I313 Pericardial effusion (noninflammatory): Secondary | ICD-10-CM | POA: Diagnosis not present

## 2017-05-23 DIAGNOSIS — I509 Heart failure, unspecified: Secondary | ICD-10-CM

## 2017-05-23 DIAGNOSIS — I110 Hypertensive heart disease with heart failure: Secondary | ICD-10-CM | POA: Diagnosis not present

## 2017-05-23 DIAGNOSIS — I083 Combined rheumatic disorders of mitral, aortic and tricuspid valves: Secondary | ICD-10-CM | POA: Insufficient documentation

## 2017-05-23 DIAGNOSIS — I272 Pulmonary hypertension, unspecified: Secondary | ICD-10-CM | POA: Insufficient documentation

## 2017-05-23 DIAGNOSIS — R011 Cardiac murmur, unspecified: Secondary | ICD-10-CM | POA: Diagnosis not present

## 2017-05-26 ENCOUNTER — Telehealth: Payer: Self-pay

## 2017-05-26 DIAGNOSIS — I350 Nonrheumatic aortic (valve) stenosis: Secondary | ICD-10-CM

## 2017-05-26 NOTE — Telephone Encounter (Signed)
-----   Message from Josue Hector, MD sent at 05/24/2017 12:49 PM EDT ----- EF normal mild AS moderate AR f/u echo in a year

## 2017-05-26 NOTE — Telephone Encounter (Signed)
Called patient with echo results. Per Dr. Johnsie Cancel, EF normal mild AS moderate AR f/u echo in a year. Patient verbalized understanding. Order for echo has been placed.

## 2017-05-27 ENCOUNTER — Ambulatory Visit (INDEPENDENT_AMBULATORY_CARE_PROVIDER_SITE_OTHER): Payer: Self-pay

## 2017-05-27 ENCOUNTER — Ambulatory Visit (INDEPENDENT_AMBULATORY_CARE_PROVIDER_SITE_OTHER): Payer: 59 | Admitting: Rheumatology

## 2017-05-27 ENCOUNTER — Encounter: Payer: Self-pay | Admitting: Rheumatology

## 2017-05-27 VITALS — BP 139/80 | HR 64 | Resp 13 | Ht 68.0 in | Wt 163.0 lb

## 2017-05-27 DIAGNOSIS — N184 Chronic kidney disease, stage 4 (severe): Secondary | ICD-10-CM

## 2017-05-27 DIAGNOSIS — Z8719 Personal history of other diseases of the digestive system: Secondary | ICD-10-CM

## 2017-05-27 DIAGNOSIS — M19071 Primary osteoarthritis, right ankle and foot: Secondary | ICD-10-CM

## 2017-05-27 DIAGNOSIS — M25461 Effusion, right knee: Secondary | ICD-10-CM | POA: Diagnosis not present

## 2017-05-27 DIAGNOSIS — M79671 Pain in right foot: Secondary | ICD-10-CM

## 2017-05-27 DIAGNOSIS — M25561 Pain in right knee: Secondary | ICD-10-CM

## 2017-05-27 DIAGNOSIS — M170 Bilateral primary osteoarthritis of knee: Secondary | ICD-10-CM

## 2017-05-27 DIAGNOSIS — M1A09X0 Idiopathic chronic gout, multiple sites, without tophus (tophi): Secondary | ICD-10-CM | POA: Diagnosis not present

## 2017-05-27 DIAGNOSIS — R21 Rash and other nonspecific skin eruption: Secondary | ICD-10-CM | POA: Diagnosis not present

## 2017-05-27 DIAGNOSIS — N186 End stage renal disease: Secondary | ICD-10-CM

## 2017-05-27 DIAGNOSIS — M7542 Impingement syndrome of left shoulder: Secondary | ICD-10-CM

## 2017-05-27 DIAGNOSIS — M19072 Primary osteoarthritis, left ankle and foot: Secondary | ICD-10-CM | POA: Diagnosis not present

## 2017-05-27 DIAGNOSIS — Z8679 Personal history of other diseases of the circulatory system: Secondary | ICD-10-CM | POA: Diagnosis not present

## 2017-05-27 DIAGNOSIS — G8929 Other chronic pain: Secondary | ICD-10-CM | POA: Diagnosis not present

## 2017-05-27 MED ORDER — PREDNISONE 5 MG PO TABS: ORAL_TABLET | ORAL | 0 refills | Status: AC

## 2017-05-27 MED ORDER — LIDOCAINE HCL 1 % IJ SOLN
3.0000 mL | INTRAMUSCULAR | Status: AC | PRN
  Administered 2017-05-27: 3 mL

## 2017-05-27 MED ORDER — TRIAMCINOLONE ACETONIDE 40 MG/ML IJ SUSP
40.0000 mg | INTRAMUSCULAR | Status: AC | PRN
  Administered 2017-05-27: 40 mg via INTRA_ARTICULAR

## 2017-05-28 LAB — SYNOVIAL CELL COUNT + DIFF, W/ CRYSTALS
BASOPHILS, %: 1 % — AB
EOSINOPHILS-SYNOVIAL: 0 % (ref 0–2)
Lymphocytes-Synovial Fld: 40 % (ref 0–74)
Monocyte/Macrophage: 34 % (ref 0–69)
Neutrophil, Synovial: 25 % — ABNORMAL HIGH (ref 0–24)
Synoviocytes, %: 0 % (ref 0–15)
WBC, Synovial: 299 cells/uL — ABNORMAL HIGH (ref <150)

## 2017-05-28 NOTE — Progress Notes (Signed)
Syno fluid not inflammtory. No crystals detected.

## 2017-07-14 DIAGNOSIS — E1129 Type 2 diabetes mellitus with other diabetic kidney complication: Secondary | ICD-10-CM | POA: Diagnosis not present

## 2017-07-14 DIAGNOSIS — Z4931 Encounter for adequacy testing for hemodialysis: Secondary | ICD-10-CM | POA: Diagnosis not present

## 2017-07-14 DIAGNOSIS — D631 Anemia in chronic kidney disease: Secondary | ICD-10-CM | POA: Diagnosis not present

## 2017-07-14 DIAGNOSIS — D630 Anemia in neoplastic disease: Secondary | ICD-10-CM | POA: Diagnosis not present

## 2017-07-14 DIAGNOSIS — M109 Gout, unspecified: Secondary | ICD-10-CM | POA: Diagnosis not present

## 2017-07-14 DIAGNOSIS — N2581 Secondary hyperparathyroidism of renal origin: Secondary | ICD-10-CM | POA: Diagnosis not present

## 2017-07-14 DIAGNOSIS — E875 Hyperkalemia: Secondary | ICD-10-CM | POA: Diagnosis not present

## 2017-07-14 DIAGNOSIS — E7841 Elevated Lipoprotein(a): Secondary | ICD-10-CM | POA: Diagnosis not present

## 2017-07-14 DIAGNOSIS — Z7682 Awaiting organ transplant status: Secondary | ICD-10-CM | POA: Diagnosis not present

## 2017-07-14 DIAGNOSIS — I120 Hypertensive chronic kidney disease with stage 5 chronic kidney disease or end stage renal disease: Secondary | ICD-10-CM | POA: Diagnosis not present

## 2017-07-14 DIAGNOSIS — D509 Iron deficiency anemia, unspecified: Secondary | ICD-10-CM | POA: Diagnosis not present

## 2017-07-14 DIAGNOSIS — Z992 Dependence on renal dialysis: Secondary | ICD-10-CM | POA: Diagnosis not present

## 2017-07-14 DIAGNOSIS — N2589 Other disorders resulting from impaired renal tubular function: Secondary | ICD-10-CM | POA: Diagnosis not present

## 2017-07-14 DIAGNOSIS — N186 End stage renal disease: Secondary | ICD-10-CM | POA: Diagnosis not present

## 2017-07-15 DIAGNOSIS — Z4931 Encounter for adequacy testing for hemodialysis: Secondary | ICD-10-CM | POA: Diagnosis not present

## 2017-07-15 DIAGNOSIS — M19012 Primary osteoarthritis, left shoulder: Secondary | ICD-10-CM | POA: Diagnosis not present

## 2017-07-15 DIAGNOSIS — D631 Anemia in chronic kidney disease: Secondary | ICD-10-CM | POA: Diagnosis not present

## 2017-07-15 DIAGNOSIS — D509 Iron deficiency anemia, unspecified: Secondary | ICD-10-CM | POA: Diagnosis not present

## 2017-07-15 DIAGNOSIS — N2589 Other disorders resulting from impaired renal tubular function: Secondary | ICD-10-CM | POA: Diagnosis not present

## 2017-07-15 DIAGNOSIS — D630 Anemia in neoplastic disease: Secondary | ICD-10-CM | POA: Diagnosis not present

## 2017-07-15 DIAGNOSIS — M25512 Pain in left shoulder: Secondary | ICD-10-CM | POA: Diagnosis not present

## 2017-07-15 DIAGNOSIS — N186 End stage renal disease: Secondary | ICD-10-CM | POA: Diagnosis not present

## 2017-07-17 DIAGNOSIS — D509 Iron deficiency anemia, unspecified: Secondary | ICD-10-CM | POA: Diagnosis not present

## 2017-07-17 DIAGNOSIS — N186 End stage renal disease: Secondary | ICD-10-CM | POA: Diagnosis not present

## 2017-07-17 DIAGNOSIS — N2589 Other disorders resulting from impaired renal tubular function: Secondary | ICD-10-CM | POA: Diagnosis not present

## 2017-07-17 DIAGNOSIS — D631 Anemia in chronic kidney disease: Secondary | ICD-10-CM | POA: Diagnosis not present

## 2017-07-17 DIAGNOSIS — Z4931 Encounter for adequacy testing for hemodialysis: Secondary | ICD-10-CM | POA: Diagnosis not present

## 2017-07-17 DIAGNOSIS — D630 Anemia in neoplastic disease: Secondary | ICD-10-CM | POA: Diagnosis not present

## 2017-07-19 DIAGNOSIS — D509 Iron deficiency anemia, unspecified: Secondary | ICD-10-CM | POA: Diagnosis not present

## 2017-07-19 DIAGNOSIS — D631 Anemia in chronic kidney disease: Secondary | ICD-10-CM | POA: Diagnosis not present

## 2017-07-19 DIAGNOSIS — Z4931 Encounter for adequacy testing for hemodialysis: Secondary | ICD-10-CM | POA: Diagnosis not present

## 2017-07-19 DIAGNOSIS — N186 End stage renal disease: Secondary | ICD-10-CM | POA: Diagnosis not present

## 2017-07-19 DIAGNOSIS — D630 Anemia in neoplastic disease: Secondary | ICD-10-CM | POA: Diagnosis not present

## 2017-07-19 DIAGNOSIS — N2589 Other disorders resulting from impaired renal tubular function: Secondary | ICD-10-CM | POA: Diagnosis not present

## 2017-07-20 DIAGNOSIS — N186 End stage renal disease: Secondary | ICD-10-CM | POA: Diagnosis not present

## 2017-07-20 DIAGNOSIS — D631 Anemia in chronic kidney disease: Secondary | ICD-10-CM | POA: Diagnosis not present

## 2017-07-20 DIAGNOSIS — D630 Anemia in neoplastic disease: Secondary | ICD-10-CM | POA: Diagnosis not present

## 2017-07-20 DIAGNOSIS — D509 Iron deficiency anemia, unspecified: Secondary | ICD-10-CM | POA: Diagnosis not present

## 2017-07-20 DIAGNOSIS — Z4931 Encounter for adequacy testing for hemodialysis: Secondary | ICD-10-CM | POA: Diagnosis not present

## 2017-07-20 DIAGNOSIS — N2589 Other disorders resulting from impaired renal tubular function: Secondary | ICD-10-CM | POA: Diagnosis not present

## 2017-07-22 DIAGNOSIS — D630 Anemia in neoplastic disease: Secondary | ICD-10-CM | POA: Diagnosis not present

## 2017-07-22 DIAGNOSIS — Z4931 Encounter for adequacy testing for hemodialysis: Secondary | ICD-10-CM | POA: Diagnosis not present

## 2017-07-22 DIAGNOSIS — D631 Anemia in chronic kidney disease: Secondary | ICD-10-CM | POA: Diagnosis not present

## 2017-07-22 DIAGNOSIS — N186 End stage renal disease: Secondary | ICD-10-CM | POA: Diagnosis not present

## 2017-07-22 DIAGNOSIS — D509 Iron deficiency anemia, unspecified: Secondary | ICD-10-CM | POA: Diagnosis not present

## 2017-07-22 DIAGNOSIS — N2589 Other disorders resulting from impaired renal tubular function: Secondary | ICD-10-CM | POA: Diagnosis not present

## 2017-07-24 DIAGNOSIS — D630 Anemia in neoplastic disease: Secondary | ICD-10-CM | POA: Diagnosis not present

## 2017-07-24 DIAGNOSIS — N2589 Other disorders resulting from impaired renal tubular function: Secondary | ICD-10-CM | POA: Diagnosis not present

## 2017-07-24 DIAGNOSIS — D509 Iron deficiency anemia, unspecified: Secondary | ICD-10-CM | POA: Diagnosis not present

## 2017-07-24 DIAGNOSIS — D631 Anemia in chronic kidney disease: Secondary | ICD-10-CM | POA: Diagnosis not present

## 2017-07-24 DIAGNOSIS — N186 End stage renal disease: Secondary | ICD-10-CM | POA: Diagnosis not present

## 2017-07-24 DIAGNOSIS — Z4931 Encounter for adequacy testing for hemodialysis: Secondary | ICD-10-CM | POA: Diagnosis not present

## 2017-07-26 DIAGNOSIS — D509 Iron deficiency anemia, unspecified: Secondary | ICD-10-CM | POA: Diagnosis not present

## 2017-07-26 DIAGNOSIS — Z4931 Encounter for adequacy testing for hemodialysis: Secondary | ICD-10-CM | POA: Diagnosis not present

## 2017-07-26 DIAGNOSIS — N186 End stage renal disease: Secondary | ICD-10-CM | POA: Diagnosis not present

## 2017-07-26 DIAGNOSIS — N2589 Other disorders resulting from impaired renal tubular function: Secondary | ICD-10-CM | POA: Diagnosis not present

## 2017-07-26 DIAGNOSIS — D630 Anemia in neoplastic disease: Secondary | ICD-10-CM | POA: Diagnosis not present

## 2017-07-26 DIAGNOSIS — D631 Anemia in chronic kidney disease: Secondary | ICD-10-CM | POA: Diagnosis not present

## 2017-07-27 DIAGNOSIS — N2589 Other disorders resulting from impaired renal tubular function: Secondary | ICD-10-CM | POA: Diagnosis not present

## 2017-07-27 DIAGNOSIS — D630 Anemia in neoplastic disease: Secondary | ICD-10-CM | POA: Diagnosis not present

## 2017-07-27 DIAGNOSIS — N186 End stage renal disease: Secondary | ICD-10-CM | POA: Diagnosis not present

## 2017-07-27 DIAGNOSIS — Z4931 Encounter for adequacy testing for hemodialysis: Secondary | ICD-10-CM | POA: Diagnosis not present

## 2017-07-27 DIAGNOSIS — D509 Iron deficiency anemia, unspecified: Secondary | ICD-10-CM | POA: Diagnosis not present

## 2017-07-27 DIAGNOSIS — D631 Anemia in chronic kidney disease: Secondary | ICD-10-CM | POA: Diagnosis not present

## 2017-07-29 DIAGNOSIS — N2589 Other disorders resulting from impaired renal tubular function: Secondary | ICD-10-CM | POA: Diagnosis not present

## 2017-07-29 DIAGNOSIS — N186 End stage renal disease: Secondary | ICD-10-CM | POA: Diagnosis not present

## 2017-07-29 DIAGNOSIS — Z4931 Encounter for adequacy testing for hemodialysis: Secondary | ICD-10-CM | POA: Diagnosis not present

## 2017-07-29 DIAGNOSIS — D631 Anemia in chronic kidney disease: Secondary | ICD-10-CM | POA: Diagnosis not present

## 2017-07-29 DIAGNOSIS — D509 Iron deficiency anemia, unspecified: Secondary | ICD-10-CM | POA: Diagnosis not present

## 2017-07-29 DIAGNOSIS — D630 Anemia in neoplastic disease: Secondary | ICD-10-CM | POA: Diagnosis not present

## 2017-07-30 DIAGNOSIS — Z4931 Encounter for adequacy testing for hemodialysis: Secondary | ICD-10-CM | POA: Diagnosis not present

## 2017-07-30 DIAGNOSIS — D509 Iron deficiency anemia, unspecified: Secondary | ICD-10-CM | POA: Diagnosis not present

## 2017-07-30 DIAGNOSIS — D630 Anemia in neoplastic disease: Secondary | ICD-10-CM | POA: Diagnosis not present

## 2017-07-30 DIAGNOSIS — N2589 Other disorders resulting from impaired renal tubular function: Secondary | ICD-10-CM | POA: Diagnosis not present

## 2017-07-30 DIAGNOSIS — D631 Anemia in chronic kidney disease: Secondary | ICD-10-CM | POA: Diagnosis not present

## 2017-07-30 DIAGNOSIS — N186 End stage renal disease: Secondary | ICD-10-CM | POA: Diagnosis not present

## 2017-07-31 DIAGNOSIS — D630 Anemia in neoplastic disease: Secondary | ICD-10-CM | POA: Diagnosis not present

## 2017-07-31 DIAGNOSIS — D509 Iron deficiency anemia, unspecified: Secondary | ICD-10-CM | POA: Diagnosis not present

## 2017-07-31 DIAGNOSIS — N186 End stage renal disease: Secondary | ICD-10-CM | POA: Diagnosis not present

## 2017-07-31 DIAGNOSIS — D631 Anemia in chronic kidney disease: Secondary | ICD-10-CM | POA: Diagnosis not present

## 2017-07-31 DIAGNOSIS — Z4931 Encounter for adequacy testing for hemodialysis: Secondary | ICD-10-CM | POA: Diagnosis not present

## 2017-07-31 DIAGNOSIS — N2589 Other disorders resulting from impaired renal tubular function: Secondary | ICD-10-CM | POA: Diagnosis not present

## 2017-08-02 DIAGNOSIS — D631 Anemia in chronic kidney disease: Secondary | ICD-10-CM | POA: Diagnosis not present

## 2017-08-02 DIAGNOSIS — N186 End stage renal disease: Secondary | ICD-10-CM | POA: Diagnosis not present

## 2017-08-02 DIAGNOSIS — D509 Iron deficiency anemia, unspecified: Secondary | ICD-10-CM | POA: Diagnosis not present

## 2017-08-02 DIAGNOSIS — D630 Anemia in neoplastic disease: Secondary | ICD-10-CM | POA: Diagnosis not present

## 2017-08-02 DIAGNOSIS — N2589 Other disorders resulting from impaired renal tubular function: Secondary | ICD-10-CM | POA: Diagnosis not present

## 2017-08-02 DIAGNOSIS — Z4931 Encounter for adequacy testing for hemodialysis: Secondary | ICD-10-CM | POA: Diagnosis not present

## 2017-08-03 DIAGNOSIS — D630 Anemia in neoplastic disease: Secondary | ICD-10-CM | POA: Diagnosis not present

## 2017-08-03 DIAGNOSIS — N186 End stage renal disease: Secondary | ICD-10-CM | POA: Diagnosis not present

## 2017-08-03 DIAGNOSIS — N2589 Other disorders resulting from impaired renal tubular function: Secondary | ICD-10-CM | POA: Diagnosis not present

## 2017-08-03 DIAGNOSIS — Z4931 Encounter for adequacy testing for hemodialysis: Secondary | ICD-10-CM | POA: Diagnosis not present

## 2017-08-03 DIAGNOSIS — D631 Anemia in chronic kidney disease: Secondary | ICD-10-CM | POA: Diagnosis not present

## 2017-08-03 DIAGNOSIS — D509 Iron deficiency anemia, unspecified: Secondary | ICD-10-CM | POA: Diagnosis not present

## 2017-08-05 DIAGNOSIS — D509 Iron deficiency anemia, unspecified: Secondary | ICD-10-CM | POA: Diagnosis not present

## 2017-08-05 DIAGNOSIS — D630 Anemia in neoplastic disease: Secondary | ICD-10-CM | POA: Diagnosis not present

## 2017-08-05 DIAGNOSIS — N186 End stage renal disease: Secondary | ICD-10-CM | POA: Diagnosis not present

## 2017-08-05 DIAGNOSIS — Z4931 Encounter for adequacy testing for hemodialysis: Secondary | ICD-10-CM | POA: Diagnosis not present

## 2017-08-05 DIAGNOSIS — R351 Nocturia: Secondary | ICD-10-CM | POA: Diagnosis not present

## 2017-08-05 DIAGNOSIS — D631 Anemia in chronic kidney disease: Secondary | ICD-10-CM | POA: Diagnosis not present

## 2017-08-05 DIAGNOSIS — N401 Enlarged prostate with lower urinary tract symptoms: Secondary | ICD-10-CM | POA: Diagnosis not present

## 2017-08-05 DIAGNOSIS — N2589 Other disorders resulting from impaired renal tubular function: Secondary | ICD-10-CM | POA: Diagnosis not present

## 2017-08-07 DIAGNOSIS — D631 Anemia in chronic kidney disease: Secondary | ICD-10-CM | POA: Diagnosis not present

## 2017-08-07 DIAGNOSIS — D630 Anemia in neoplastic disease: Secondary | ICD-10-CM | POA: Diagnosis not present

## 2017-08-07 DIAGNOSIS — Z4931 Encounter for adequacy testing for hemodialysis: Secondary | ICD-10-CM | POA: Diagnosis not present

## 2017-08-07 DIAGNOSIS — D509 Iron deficiency anemia, unspecified: Secondary | ICD-10-CM | POA: Diagnosis not present

## 2017-08-07 DIAGNOSIS — N2589 Other disorders resulting from impaired renal tubular function: Secondary | ICD-10-CM | POA: Diagnosis not present

## 2017-08-07 DIAGNOSIS — N186 End stage renal disease: Secondary | ICD-10-CM | POA: Diagnosis not present

## 2017-08-09 DIAGNOSIS — Z4931 Encounter for adequacy testing for hemodialysis: Secondary | ICD-10-CM | POA: Diagnosis not present

## 2017-08-09 DIAGNOSIS — D630 Anemia in neoplastic disease: Secondary | ICD-10-CM | POA: Diagnosis not present

## 2017-08-09 DIAGNOSIS — D631 Anemia in chronic kidney disease: Secondary | ICD-10-CM | POA: Diagnosis not present

## 2017-08-09 DIAGNOSIS — D509 Iron deficiency anemia, unspecified: Secondary | ICD-10-CM | POA: Diagnosis not present

## 2017-08-09 DIAGNOSIS — N186 End stage renal disease: Secondary | ICD-10-CM | POA: Diagnosis not present

## 2017-08-09 DIAGNOSIS — N2589 Other disorders resulting from impaired renal tubular function: Secondary | ICD-10-CM | POA: Diagnosis not present

## 2017-08-10 DIAGNOSIS — D509 Iron deficiency anemia, unspecified: Secondary | ICD-10-CM | POA: Diagnosis not present

## 2017-08-10 DIAGNOSIS — D630 Anemia in neoplastic disease: Secondary | ICD-10-CM | POA: Diagnosis not present

## 2017-08-10 DIAGNOSIS — Z4931 Encounter for adequacy testing for hemodialysis: Secondary | ICD-10-CM | POA: Diagnosis not present

## 2017-08-10 DIAGNOSIS — N2589 Other disorders resulting from impaired renal tubular function: Secondary | ICD-10-CM | POA: Diagnosis not present

## 2017-08-10 DIAGNOSIS — D631 Anemia in chronic kidney disease: Secondary | ICD-10-CM | POA: Diagnosis not present

## 2017-08-10 DIAGNOSIS — N186 End stage renal disease: Secondary | ICD-10-CM | POA: Diagnosis not present

## 2017-08-12 DIAGNOSIS — D630 Anemia in neoplastic disease: Secondary | ICD-10-CM | POA: Diagnosis not present

## 2017-08-12 DIAGNOSIS — N2589 Other disorders resulting from impaired renal tubular function: Secondary | ICD-10-CM | POA: Diagnosis not present

## 2017-08-12 DIAGNOSIS — Z4931 Encounter for adequacy testing for hemodialysis: Secondary | ICD-10-CM | POA: Diagnosis not present

## 2017-08-12 DIAGNOSIS — D509 Iron deficiency anemia, unspecified: Secondary | ICD-10-CM | POA: Diagnosis not present

## 2017-08-12 DIAGNOSIS — N186 End stage renal disease: Secondary | ICD-10-CM | POA: Diagnosis not present

## 2017-08-12 DIAGNOSIS — D631 Anemia in chronic kidney disease: Secondary | ICD-10-CM | POA: Diagnosis not present

## 2017-08-13 DIAGNOSIS — T82858A Stenosis of vascular prosthetic devices, implants and grafts, initial encounter: Secondary | ICD-10-CM | POA: Diagnosis not present

## 2017-08-13 DIAGNOSIS — N2589 Other disorders resulting from impaired renal tubular function: Secondary | ICD-10-CM | POA: Diagnosis not present

## 2017-08-13 DIAGNOSIS — I871 Compression of vein: Secondary | ICD-10-CM | POA: Diagnosis not present

## 2017-08-13 DIAGNOSIS — N186 End stage renal disease: Secondary | ICD-10-CM | POA: Diagnosis not present

## 2017-08-13 DIAGNOSIS — Z992 Dependence on renal dialysis: Secondary | ICD-10-CM | POA: Diagnosis not present

## 2017-08-14 DIAGNOSIS — N186 End stage renal disease: Secondary | ICD-10-CM | POA: Diagnosis not present

## 2017-08-14 DIAGNOSIS — D631 Anemia in chronic kidney disease: Secondary | ICD-10-CM | POA: Diagnosis not present

## 2017-08-14 DIAGNOSIS — D509 Iron deficiency anemia, unspecified: Secondary | ICD-10-CM | POA: Diagnosis not present

## 2017-08-14 DIAGNOSIS — N2589 Other disorders resulting from impaired renal tubular function: Secondary | ICD-10-CM | POA: Diagnosis not present

## 2017-08-14 DIAGNOSIS — Z992 Dependence on renal dialysis: Secondary | ICD-10-CM | POA: Diagnosis not present

## 2017-08-14 DIAGNOSIS — E875 Hyperkalemia: Secondary | ICD-10-CM | POA: Diagnosis not present

## 2017-08-14 DIAGNOSIS — I120 Hypertensive chronic kidney disease with stage 5 chronic kidney disease or end stage renal disease: Secondary | ICD-10-CM | POA: Diagnosis not present

## 2017-08-14 DIAGNOSIS — N2581 Secondary hyperparathyroidism of renal origin: Secondary | ICD-10-CM | POA: Diagnosis not present

## 2017-08-16 DIAGNOSIS — N186 End stage renal disease: Secondary | ICD-10-CM | POA: Diagnosis not present

## 2017-08-16 DIAGNOSIS — I120 Hypertensive chronic kidney disease with stage 5 chronic kidney disease or end stage renal disease: Secondary | ICD-10-CM | POA: Diagnosis not present

## 2017-08-16 DIAGNOSIS — D631 Anemia in chronic kidney disease: Secondary | ICD-10-CM | POA: Diagnosis not present

## 2017-08-16 DIAGNOSIS — Z992 Dependence on renal dialysis: Secondary | ICD-10-CM | POA: Diagnosis not present

## 2017-08-16 DIAGNOSIS — D509 Iron deficiency anemia, unspecified: Secondary | ICD-10-CM | POA: Diagnosis not present

## 2017-08-16 DIAGNOSIS — E875 Hyperkalemia: Secondary | ICD-10-CM | POA: Diagnosis not present

## 2017-08-17 DIAGNOSIS — N186 End stage renal disease: Secondary | ICD-10-CM | POA: Diagnosis not present

## 2017-08-17 DIAGNOSIS — D509 Iron deficiency anemia, unspecified: Secondary | ICD-10-CM | POA: Diagnosis not present

## 2017-08-17 DIAGNOSIS — Z992 Dependence on renal dialysis: Secondary | ICD-10-CM | POA: Diagnosis not present

## 2017-08-17 DIAGNOSIS — I120 Hypertensive chronic kidney disease with stage 5 chronic kidney disease or end stage renal disease: Secondary | ICD-10-CM | POA: Diagnosis not present

## 2017-08-17 DIAGNOSIS — D631 Anemia in chronic kidney disease: Secondary | ICD-10-CM | POA: Diagnosis not present

## 2017-08-17 DIAGNOSIS — E875 Hyperkalemia: Secondary | ICD-10-CM | POA: Diagnosis not present

## 2017-08-19 DIAGNOSIS — E875 Hyperkalemia: Secondary | ICD-10-CM | POA: Diagnosis not present

## 2017-08-19 DIAGNOSIS — D509 Iron deficiency anemia, unspecified: Secondary | ICD-10-CM | POA: Diagnosis not present

## 2017-08-19 DIAGNOSIS — I120 Hypertensive chronic kidney disease with stage 5 chronic kidney disease or end stage renal disease: Secondary | ICD-10-CM | POA: Diagnosis not present

## 2017-08-19 DIAGNOSIS — Z992 Dependence on renal dialysis: Secondary | ICD-10-CM | POA: Diagnosis not present

## 2017-08-19 DIAGNOSIS — D631 Anemia in chronic kidney disease: Secondary | ICD-10-CM | POA: Diagnosis not present

## 2017-08-19 DIAGNOSIS — N186 End stage renal disease: Secondary | ICD-10-CM | POA: Diagnosis not present

## 2017-08-21 DIAGNOSIS — N186 End stage renal disease: Secondary | ICD-10-CM | POA: Diagnosis not present

## 2017-08-21 DIAGNOSIS — Z992 Dependence on renal dialysis: Secondary | ICD-10-CM | POA: Diagnosis not present

## 2017-08-21 DIAGNOSIS — D631 Anemia in chronic kidney disease: Secondary | ICD-10-CM | POA: Diagnosis not present

## 2017-08-21 DIAGNOSIS — E875 Hyperkalemia: Secondary | ICD-10-CM | POA: Diagnosis not present

## 2017-08-21 DIAGNOSIS — D509 Iron deficiency anemia, unspecified: Secondary | ICD-10-CM | POA: Diagnosis not present

## 2017-08-21 DIAGNOSIS — I120 Hypertensive chronic kidney disease with stage 5 chronic kidney disease or end stage renal disease: Secondary | ICD-10-CM | POA: Diagnosis not present

## 2017-08-23 DIAGNOSIS — Z992 Dependence on renal dialysis: Secondary | ICD-10-CM | POA: Diagnosis not present

## 2017-08-23 DIAGNOSIS — I120 Hypertensive chronic kidney disease with stage 5 chronic kidney disease or end stage renal disease: Secondary | ICD-10-CM | POA: Diagnosis not present

## 2017-08-23 DIAGNOSIS — E875 Hyperkalemia: Secondary | ICD-10-CM | POA: Diagnosis not present

## 2017-08-23 DIAGNOSIS — D509 Iron deficiency anemia, unspecified: Secondary | ICD-10-CM | POA: Diagnosis not present

## 2017-08-23 DIAGNOSIS — N186 End stage renal disease: Secondary | ICD-10-CM | POA: Diagnosis not present

## 2017-08-23 DIAGNOSIS — D631 Anemia in chronic kidney disease: Secondary | ICD-10-CM | POA: Diagnosis not present

## 2017-08-24 DIAGNOSIS — Z992 Dependence on renal dialysis: Secondary | ICD-10-CM | POA: Diagnosis not present

## 2017-08-24 DIAGNOSIS — D631 Anemia in chronic kidney disease: Secondary | ICD-10-CM | POA: Diagnosis not present

## 2017-08-24 DIAGNOSIS — N186 End stage renal disease: Secondary | ICD-10-CM | POA: Diagnosis not present

## 2017-08-24 DIAGNOSIS — E875 Hyperkalemia: Secondary | ICD-10-CM | POA: Diagnosis not present

## 2017-08-24 DIAGNOSIS — D509 Iron deficiency anemia, unspecified: Secondary | ICD-10-CM | POA: Diagnosis not present

## 2017-08-24 DIAGNOSIS — I120 Hypertensive chronic kidney disease with stage 5 chronic kidney disease or end stage renal disease: Secondary | ICD-10-CM | POA: Diagnosis not present

## 2017-08-26 DIAGNOSIS — Z992 Dependence on renal dialysis: Secondary | ICD-10-CM | POA: Diagnosis not present

## 2017-08-26 DIAGNOSIS — D631 Anemia in chronic kidney disease: Secondary | ICD-10-CM | POA: Diagnosis not present

## 2017-08-26 DIAGNOSIS — I120 Hypertensive chronic kidney disease with stage 5 chronic kidney disease or end stage renal disease: Secondary | ICD-10-CM | POA: Diagnosis not present

## 2017-08-26 DIAGNOSIS — E875 Hyperkalemia: Secondary | ICD-10-CM | POA: Diagnosis not present

## 2017-08-26 DIAGNOSIS — N186 End stage renal disease: Secondary | ICD-10-CM | POA: Diagnosis not present

## 2017-08-26 DIAGNOSIS — D509 Iron deficiency anemia, unspecified: Secondary | ICD-10-CM | POA: Diagnosis not present

## 2017-08-27 DIAGNOSIS — N186 End stage renal disease: Secondary | ICD-10-CM | POA: Diagnosis not present

## 2017-08-27 DIAGNOSIS — N2581 Secondary hyperparathyroidism of renal origin: Secondary | ICD-10-CM | POA: Diagnosis not present

## 2017-08-28 DIAGNOSIS — E875 Hyperkalemia: Secondary | ICD-10-CM | POA: Diagnosis not present

## 2017-08-28 DIAGNOSIS — N186 End stage renal disease: Secondary | ICD-10-CM | POA: Diagnosis not present

## 2017-08-28 DIAGNOSIS — D631 Anemia in chronic kidney disease: Secondary | ICD-10-CM | POA: Diagnosis not present

## 2017-08-28 DIAGNOSIS — D509 Iron deficiency anemia, unspecified: Secondary | ICD-10-CM | POA: Diagnosis not present

## 2017-08-28 DIAGNOSIS — Z992 Dependence on renal dialysis: Secondary | ICD-10-CM | POA: Diagnosis not present

## 2017-08-28 DIAGNOSIS — I120 Hypertensive chronic kidney disease with stage 5 chronic kidney disease or end stage renal disease: Secondary | ICD-10-CM | POA: Diagnosis not present

## 2017-08-29 DIAGNOSIS — N186 End stage renal disease: Secondary | ICD-10-CM | POA: Diagnosis not present

## 2017-08-29 DIAGNOSIS — N2581 Secondary hyperparathyroidism of renal origin: Secondary | ICD-10-CM | POA: Diagnosis not present

## 2017-08-30 DIAGNOSIS — Z992 Dependence on renal dialysis: Secondary | ICD-10-CM | POA: Diagnosis not present

## 2017-08-30 DIAGNOSIS — E875 Hyperkalemia: Secondary | ICD-10-CM | POA: Diagnosis not present

## 2017-08-30 DIAGNOSIS — N186 End stage renal disease: Secondary | ICD-10-CM | POA: Diagnosis not present

## 2017-08-30 DIAGNOSIS — D509 Iron deficiency anemia, unspecified: Secondary | ICD-10-CM | POA: Diagnosis not present

## 2017-08-30 DIAGNOSIS — D631 Anemia in chronic kidney disease: Secondary | ICD-10-CM | POA: Diagnosis not present

## 2017-08-30 DIAGNOSIS — I120 Hypertensive chronic kidney disease with stage 5 chronic kidney disease or end stage renal disease: Secondary | ICD-10-CM | POA: Diagnosis not present

## 2017-08-31 DIAGNOSIS — Z992 Dependence on renal dialysis: Secondary | ICD-10-CM | POA: Diagnosis not present

## 2017-08-31 DIAGNOSIS — D509 Iron deficiency anemia, unspecified: Secondary | ICD-10-CM | POA: Diagnosis not present

## 2017-08-31 DIAGNOSIS — I120 Hypertensive chronic kidney disease with stage 5 chronic kidney disease or end stage renal disease: Secondary | ICD-10-CM | POA: Diagnosis not present

## 2017-08-31 DIAGNOSIS — D631 Anemia in chronic kidney disease: Secondary | ICD-10-CM | POA: Diagnosis not present

## 2017-08-31 DIAGNOSIS — E875 Hyperkalemia: Secondary | ICD-10-CM | POA: Diagnosis not present

## 2017-08-31 DIAGNOSIS — N186 End stage renal disease: Secondary | ICD-10-CM | POA: Diagnosis not present

## 2017-09-02 DIAGNOSIS — I120 Hypertensive chronic kidney disease with stage 5 chronic kidney disease or end stage renal disease: Secondary | ICD-10-CM | POA: Diagnosis not present

## 2017-09-02 DIAGNOSIS — E875 Hyperkalemia: Secondary | ICD-10-CM | POA: Diagnosis not present

## 2017-09-02 DIAGNOSIS — N186 End stage renal disease: Secondary | ICD-10-CM | POA: Diagnosis not present

## 2017-09-02 DIAGNOSIS — Z992 Dependence on renal dialysis: Secondary | ICD-10-CM | POA: Diagnosis not present

## 2017-09-02 DIAGNOSIS — D509 Iron deficiency anemia, unspecified: Secondary | ICD-10-CM | POA: Diagnosis not present

## 2017-09-02 DIAGNOSIS — D631 Anemia in chronic kidney disease: Secondary | ICD-10-CM | POA: Diagnosis not present

## 2017-09-04 DIAGNOSIS — E875 Hyperkalemia: Secondary | ICD-10-CM | POA: Diagnosis not present

## 2017-09-04 DIAGNOSIS — I120 Hypertensive chronic kidney disease with stage 5 chronic kidney disease or end stage renal disease: Secondary | ICD-10-CM | POA: Diagnosis not present

## 2017-09-04 DIAGNOSIS — D509 Iron deficiency anemia, unspecified: Secondary | ICD-10-CM | POA: Diagnosis not present

## 2017-09-04 DIAGNOSIS — D631 Anemia in chronic kidney disease: Secondary | ICD-10-CM | POA: Diagnosis not present

## 2017-09-04 DIAGNOSIS — N186 End stage renal disease: Secondary | ICD-10-CM | POA: Diagnosis not present

## 2017-09-04 DIAGNOSIS — Z992 Dependence on renal dialysis: Secondary | ICD-10-CM | POA: Diagnosis not present

## 2017-09-06 ENCOUNTER — Encounter (HOSPITAL_COMMUNITY): Payer: Self-pay | Admitting: Emergency Medicine

## 2017-09-06 ENCOUNTER — Emergency Department (HOSPITAL_COMMUNITY)
Admission: EM | Admit: 2017-09-06 | Discharge: 2017-09-06 | Disposition: A | Discharge status: Home / Self Care | Payer: Medicare Other | Attending: Emergency Medicine | Admitting: Emergency Medicine

## 2017-09-06 ENCOUNTER — Other Ambulatory Visit: Payer: Self-pay

## 2017-09-06 DIAGNOSIS — Z7901 Long term (current) use of anticoagulants: Secondary | ICD-10-CM | POA: Diagnosis not present

## 2017-09-06 DIAGNOSIS — R55 Syncope and collapse: Secondary | ICD-10-CM | POA: Diagnosis not present

## 2017-09-06 DIAGNOSIS — E875 Hyperkalemia: Secondary | ICD-10-CM | POA: Diagnosis not present

## 2017-09-06 DIAGNOSIS — Z79899 Other long term (current) drug therapy: Secondary | ICD-10-CM | POA: Insufficient documentation

## 2017-09-06 DIAGNOSIS — I120 Hypertensive chronic kidney disease with stage 5 chronic kidney disease or end stage renal disease: Secondary | ICD-10-CM | POA: Diagnosis not present

## 2017-09-06 DIAGNOSIS — D631 Anemia in chronic kidney disease: Secondary | ICD-10-CM | POA: Diagnosis not present

## 2017-09-06 DIAGNOSIS — E039 Hypothyroidism, unspecified: Secondary | ICD-10-CM | POA: Diagnosis not present

## 2017-09-06 DIAGNOSIS — Z992 Dependence on renal dialysis: Secondary | ICD-10-CM | POA: Insufficient documentation

## 2017-09-06 DIAGNOSIS — I499 Cardiac arrhythmia, unspecified: Secondary | ICD-10-CM | POA: Diagnosis not present

## 2017-09-06 DIAGNOSIS — N186 End stage renal disease: Secondary | ICD-10-CM | POA: Diagnosis not present

## 2017-09-06 DIAGNOSIS — R531 Weakness: Secondary | ICD-10-CM | POA: Diagnosis present

## 2017-09-06 DIAGNOSIS — D509 Iron deficiency anemia, unspecified: Secondary | ICD-10-CM | POA: Diagnosis not present

## 2017-09-06 DIAGNOSIS — R42 Dizziness and giddiness: Secondary | ICD-10-CM | POA: Diagnosis not present

## 2017-09-06 DIAGNOSIS — I959 Hypotension, unspecified: Secondary | ICD-10-CM | POA: Diagnosis not present

## 2017-09-06 LAB — CBC WITH DIFFERENTIAL/PLATELET
Abs Immature Granulocytes: 0.2 10*3/uL — ABNORMAL HIGH (ref 0.0–0.1)
Basophils Absolute: 0.1 10*3/uL (ref 0.0–0.1)
Basophils Relative: 1 %
Eosinophils Absolute: 0.9 10*3/uL — ABNORMAL HIGH (ref 0.0–0.7)
Eosinophils Relative: 10 %
HCT: 40.7 % (ref 39.0–52.0)
Hemoglobin: 13.1 g/dL (ref 13.0–17.0)
Immature Granulocytes: 2 %
Lymphocytes Relative: 20 %
Lymphs Abs: 1.7 10*3/uL (ref 0.7–4.0)
MCH: 29.8 pg (ref 26.0–34.0)
MCHC: 32.2 g/dL (ref 30.0–36.0)
MCV: 92.5 fL (ref 78.0–100.0)
Monocytes Absolute: 0.8 10*3/uL (ref 0.1–1.0)
Monocytes Relative: 9 %
Neutro Abs: 4.9 10*3/uL (ref 1.7–7.7)
Neutrophils Relative %: 58 %
Platelets: 153 10*3/uL (ref 150–400)
RBC: 4.4 MIL/uL (ref 4.22–5.81)
RDW: 15 % (ref 11.5–15.5)
WBC: 8.5 10*3/uL (ref 4.0–10.5)

## 2017-09-06 LAB — BASIC METABOLIC PANEL
Anion gap: 14 (ref 5–15)
BUN: 40 mg/dL — ABNORMAL HIGH (ref 6–20)
CO2: 23 mmol/L (ref 22–32)
Calcium: 8.9 mg/dL (ref 8.9–10.3)
Chloride: 98 mmol/L (ref 98–111)
Creatinine, Ser: 7.4 mg/dL — ABNORMAL HIGH (ref 0.61–1.24)
GFR calc Af Amer: 9 mL/min — ABNORMAL LOW (ref >60)
GFR calc non Af Amer: 8 mL/min — ABNORMAL LOW (ref >60)
Glucose, Bld: 110 mg/dL — ABNORMAL HIGH (ref 70–99)
Potassium: 3.8 mmol/L (ref 3.5–5.1)
Sodium: 135 mmol/L (ref 135–145)

## 2017-09-06 NOTE — ED Provider Notes (Signed)
[facility] EMERGENCY DEPARTMENT Provider Note   CSN: [id number] Arrival date & time: 09/06/17  1519     History   Chief Complaint Chief Complaint  Patient presents with  . Weakness    HPI Colin Mcfarland is a 52 y.o. male.  HPI   52 year old male with near syncope.  He has end-stage renal disease and does dialysis at home on Mondays, Tuesdays, Thursdays and Saturdays.  He typically does dialysis for a little bit less than 3 hours per session.  He was in our into his session today when he began feeling lightheaded, diaphoretic and felt like his vision was getting dark. He denies any pain or feeling SOB. He states that he will sometimes get mild symptoms like this, but not to this degree.  His wife stopped the dialysis and EMS was called.  Apparently he was orthostatic for them.  He received approximately 150 cc of normal saline prior to arrival.  Currently feels like he is back to his baseline.  He states that he is very conscious about his fluid status and had his wife set to have 0.2 L more removed than recommended.   Past Medical History:  Diagnosis Date  . Anemia   . Arthritis   . 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 Chronic kidney disease   . Eczema    hx: of  . GERD (gastroesophageal reflux disease)   . Hypertension   . Hypothyroidism   . 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  . Osteoarthritis of both feet 11/22/2015  . Osteoarthritis of both knees 11/22/2015  . Pneumonia   . Shoulder impingement, left 11/22/2015    Patient Active Problem List   Diagnosis Date Noted  . Pneumonia 04/28/2017  . GERD (gastroesophageal reflux disease) 11/22/2015  . Shoulder impingement, left 11/22/2015  . Osteoarthritis of both feet 11/22/2015  . Osteoarthritis of both knees 11/22/2015  . 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  . Pain of left upper extremity 07/21/2013  . 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  Murmur 04/12/2013  . Syncope 04/12/2013  . Chronic kidney disease (CKD), stage IV (severe) ([place]) 03/02/2013  . End stage renal disease ([place]) 02/25/2012  . HYPOTHYROIDISM 01/14/2007  . Gout 01/14/2007  . HYPERTENSION 01/14/2007  . GASTRITIS, CHRONIC 01/14/2007  . IGA NEPHROPATHY 01/14/2007  . OTHER DYSPHAGIA 01/14/2007  . DIARRHEA 01/14/2007  . HELICOBACTER PYLORI INFECTION, HX OF 01/14/2007    Past Surgical History:  Procedure Laterality Date  . AV FISTULA PLACEMENT Left 03/18/2013   Procedure: ARTERIOVENOUS (AV) FISTULA CREATION- BRACHIOCEPHALIC;  Surgeon: Mal Misty, MD;  Location: [facility];  Service: Vascular;  Laterality: Left;  . HEMORRHOID SURGERY    . RENAL BIOPSY    . SHOULDER SURGERY          Home Medications    Prior to Admission medications   Medication Sig Start Date End Date Taking? Authorizing Provider  allopurinol (ZYLOPRIM) 100 MG tablet Take 100 mg by mouth at bedtime.     [provider]  amLODipine (NORVASC) 10 MG tablet Take 5 mg by mouth daily.     [provider]  amoxicillin (AMOXIL) 500 MG capsule Take by mouth. With Dental procedures    [provider]  calcium acetate (PHOSLO) 667 MG capsule Take 667 mg by mouth 3 (three) times daily with meals.    [provider]  calcium carbonate (TUMS - DOSED IN MG ELEMENTAL CALCIUM) 500 MG chewable tablet  Chew 1 tablet by mouth as needed.     [provider]  cinacalcet (SENSIPAR) 30 MG tablet Take 30 mg by mouth every other day.     [provider]  clobetasol cream (TEMOVATE) 7.90 % Apply 1 application topically as directed.    [provider]  ferrous sulfate 325 (65 FE) MG tablet Take 325 mg by mouth at bedtime.    [provider]  furosemide (LASIX) 80 MG tablet Take 80 mg by mouth as directed.    [provider]  heparin 1000 UNIT/ML injection ADMINISTER AS DIRECTED 04/07/17   [provider]  HYDROcodone-acetaminophen  (NORCO/VICODIN) 5-325 MG per tablet Take 1 tablet by mouth every 6 (six) hours as needed for moderate pain (pain/gout flare ups). 03/18/13   Ulyses Amor, PA-C  labetalol (NORMODYNE) 200 MG tablet Take 200 mg by mouth 3 (three) times daily.     [provider]  lansoprazole (PREVACID) 30 MG capsule Take 30 mg by mouth at bedtime.     [provider]  levothyroxine (SYNTHROID, LEVOTHROID) 125 MCG tablet Take 125 mcg by mouth daily.    [provider]  losartan (COZAAR) 25 MG tablet Take 25 mg by mouth as directed.    [provider]  multivitamin (RENA-VIT) TABS tablet Take 1 tablet by mouth daily.    [provider]  polyethylene glycol powder (MIRALAX) powder Take 1 Container daily by mouth.    [provider]  predniSONE (DELTASONE) 5 MG tablet Take 4 tablets x2 days, 3 tablets x2 days, 2 tablets x2 days, 1 tablet x2 days. 05/27/17   Bo Merino, MD  sildenafil (VIAGRA) 100 MG tablet Take 100 mg by mouth daily as needed for erectile dysfunction.    [provider]  tamsulosin (FLOMAX) 0.4 MG CAPS capsule Take 0.4 mg by mouth as directed.    [provider]    Family History Family History  Problem Relation Age of Onset  . Diabetes Father   . Hypertension Father   . Cancer Mother   . Diabetes Brother     Social History Social History   Tobacco Use  . Smoking status: Never Smoker  . Smokeless tobacco: Never Used  Substance Use Topics  . Alcohol use: No  . Drug use: Never     Allergies   Blue dyes (parenteral); Uloric [febuxostat]; and Sulfa antibiotics   Review of Systems Review of Systems  All systems reviewed and negative, other than as noted in HPI.  Physical Exam Updated Vital Signs BP 115/74   Pulse 63   Temp 97.8 F (36.6 C) (Oral)   Resp 15   Ht 5\' 8"  (1.727 m)   Wt 73.5 kg   SpO2 100%   BMI 24.63 kg/m   Physical Exam  Constitutional: He appears well-developed and  well-nourished. No distress.  Laying in bed. NAD.   HENT:  Head: Normocephalic and atraumatic.  Eyes: Pupils are equal, round, and reactive to light. Conjunctivae and EOM are normal. Right eye exhibits no discharge. Left eye exhibits no discharge.  Neck: Neck supple.  Cardiovascular: Normal rate, regular rhythm and normal heart sounds. Exam reveals no gallop and no friction rub.  No murmur heard. Fistula LUE  Pulmonary/Chest: Effort normal and breath sounds normal. No respiratory distress.  Abdominal: Soft. He exhibits no distension. There is no tenderness.  Musculoskeletal: He exhibits no edema or tenderness.  Neurological: He is alert.  Skin: Skin is warm and dry.  Psychiatric:  He has a normal mood and affect. His behavior is normal. Thought content normal.  Nursing note and vitals reviewed.    ED Treatments / Results  Labs (all labs ordered are listed, but only abnormal results are displayed) Labs Reviewed  CBC WITH DIFFERENTIAL/PLATELET - Abnormal; Notable for the following components:      Result Value   Eosinophils Absolute 0.9 (*)    Abs Immature Granulocytes 0.2 (*)    All other components within normal limits  BASIC METABOLIC PANEL - Abnormal; Notable for the following components:   Glucose, Bld 110 (*)    BUN 40 (*)    Creatinine, Ser 7.40 (*)    GFR calc non Af Amer 8 (*)    GFR calc Af Amer 9 (*)    All other components within normal limits    EKG EKG Interpretation  Date/Time:  Saturday September 06 2017 15:38:15 EDT Ventricular Rate:  62 PR Interval:    QRS Duration: 101 QT Interval:  451 QTC Calculation: 458 R Axis:   42 Text Interpretation:  Sinus rhythm Probable left ventricular hypertrophy Confirmed by Virgel Manifold [phone]) on 09/06/2017 4:56:41 PM   Radiology No results found.  Procedures Procedures (including critical care time)  Medications Ordered in ED Medications - No data to display   Initial Impression / Assessment and Plan / ED  Course  I have reviewed the triage vital signs and the nursing notes.  Pertinent labs & imaging results that were available during my care of the patient were reviewed by me and considered in my medical decision making (see chart for details).    Nursing notes and vital signs reviewed.  Prior labs and notes reviewed for additional history.  EKG for near syncopal symptoms and fairly unremarkable.   Labs to assess particulary for electrolyte derangement and anemia with hx of ESRD. He is actually not anemic which is somewhat surprising. Electrolytes ok.   He is feeling better now. Suspect related to volume or rate of removal with dialysis today. I doubt emergent process.   Final Clinical Impressions(s) / ED Diagnoses   Final diagnoses:  Near syncope    ED Discharge Orders    None       Virgel Manifold, MD 09/13/17 1115

## 2017-09-06 NOTE — ED Triage Notes (Signed)
Per [facility] pt coming from home, during dialysis treatment began to feel very weak and dizzy. They immediately stopped the treatment and called EMS. EMS administered 150CC NS. Was positive for orthostatics. Pt denies any pain and now feeling better after fluids.

## 2017-09-07 DIAGNOSIS — D631 Anemia in chronic kidney disease: Secondary | ICD-10-CM | POA: Diagnosis not present

## 2017-09-07 DIAGNOSIS — D509 Iron deficiency anemia, unspecified: Secondary | ICD-10-CM | POA: Diagnosis not present

## 2017-09-07 DIAGNOSIS — E875 Hyperkalemia: Secondary | ICD-10-CM | POA: Diagnosis not present

## 2017-09-07 DIAGNOSIS — N186 End stage renal disease: Secondary | ICD-10-CM | POA: Diagnosis not present

## 2017-09-07 DIAGNOSIS — Z992 Dependence on renal dialysis: Secondary | ICD-10-CM | POA: Diagnosis not present

## 2017-09-07 DIAGNOSIS — I120 Hypertensive chronic kidney disease with stage 5 chronic kidney disease or end stage renal disease: Secondary | ICD-10-CM | POA: Diagnosis not present

## 2017-09-08 DIAGNOSIS — N186 End stage renal disease: Secondary | ICD-10-CM | POA: Diagnosis not present

## 2017-09-08 DIAGNOSIS — E875 Hyperkalemia: Secondary | ICD-10-CM | POA: Diagnosis not present

## 2017-09-08 DIAGNOSIS — Z992 Dependence on renal dialysis: Secondary | ICD-10-CM | POA: Diagnosis not present

## 2017-09-08 DIAGNOSIS — D631 Anemia in chronic kidney disease: Secondary | ICD-10-CM | POA: Diagnosis not present

## 2017-09-08 DIAGNOSIS — I120 Hypertensive chronic kidney disease with stage 5 chronic kidney disease or end stage renal disease: Secondary | ICD-10-CM | POA: Diagnosis not present

## 2017-09-08 DIAGNOSIS — D509 Iron deficiency anemia, unspecified: Secondary | ICD-10-CM | POA: Diagnosis not present

## 2017-09-09 DIAGNOSIS — D509 Iron deficiency anemia, unspecified: Secondary | ICD-10-CM | POA: Diagnosis not present

## 2017-09-09 DIAGNOSIS — N186 End stage renal disease: Secondary | ICD-10-CM | POA: Diagnosis not present

## 2017-09-09 DIAGNOSIS — Z992 Dependence on renal dialysis: Secondary | ICD-10-CM | POA: Diagnosis not present

## 2017-09-09 DIAGNOSIS — I120 Hypertensive chronic kidney disease with stage 5 chronic kidney disease or end stage renal disease: Secondary | ICD-10-CM | POA: Diagnosis not present

## 2017-09-09 DIAGNOSIS — E875 Hyperkalemia: Secondary | ICD-10-CM | POA: Diagnosis not present

## 2017-09-09 DIAGNOSIS — D631 Anemia in chronic kidney disease: Secondary | ICD-10-CM | POA: Diagnosis not present

## 2017-09-11 DIAGNOSIS — I120 Hypertensive chronic kidney disease with stage 5 chronic kidney disease or end stage renal disease: Secondary | ICD-10-CM | POA: Diagnosis not present

## 2017-09-11 DIAGNOSIS — Z992 Dependence on renal dialysis: Secondary | ICD-10-CM | POA: Diagnosis not present

## 2017-09-11 DIAGNOSIS — E875 Hyperkalemia: Secondary | ICD-10-CM | POA: Diagnosis not present

## 2017-09-11 DIAGNOSIS — N186 End stage renal disease: Secondary | ICD-10-CM | POA: Diagnosis not present

## 2017-09-11 DIAGNOSIS — D509 Iron deficiency anemia, unspecified: Secondary | ICD-10-CM | POA: Diagnosis not present

## 2017-09-11 DIAGNOSIS — D631 Anemia in chronic kidney disease: Secondary | ICD-10-CM | POA: Diagnosis not present

## 2017-09-13 DIAGNOSIS — N186 End stage renal disease: Secondary | ICD-10-CM | POA: Diagnosis not present

## 2017-09-13 DIAGNOSIS — I120 Hypertensive chronic kidney disease with stage 5 chronic kidney disease or end stage renal disease: Secondary | ICD-10-CM | POA: Diagnosis not present

## 2017-09-13 DIAGNOSIS — Z992 Dependence on renal dialysis: Secondary | ICD-10-CM | POA: Diagnosis not present

## 2017-09-13 DIAGNOSIS — D631 Anemia in chronic kidney disease: Secondary | ICD-10-CM | POA: Diagnosis not present

## 2017-09-13 DIAGNOSIS — D509 Iron deficiency anemia, unspecified: Secondary | ICD-10-CM | POA: Diagnosis not present

## 2017-09-13 DIAGNOSIS — E875 Hyperkalemia: Secondary | ICD-10-CM | POA: Diagnosis not present

## 2017-09-14 DIAGNOSIS — D631 Anemia in chronic kidney disease: Secondary | ICD-10-CM | POA: Diagnosis not present

## 2017-09-14 DIAGNOSIS — N2581 Secondary hyperparathyroidism of renal origin: Secondary | ICD-10-CM | POA: Diagnosis not present

## 2017-09-14 DIAGNOSIS — I120 Hypertensive chronic kidney disease with stage 5 chronic kidney disease or end stage renal disease: Secondary | ICD-10-CM | POA: Diagnosis not present

## 2017-09-14 DIAGNOSIS — Z23 Encounter for immunization: Secondary | ICD-10-CM | POA: Diagnosis not present

## 2017-09-14 DIAGNOSIS — N186 End stage renal disease: Secondary | ICD-10-CM | POA: Diagnosis not present

## 2017-09-14 DIAGNOSIS — D509 Iron deficiency anemia, unspecified: Secondary | ICD-10-CM | POA: Diagnosis not present

## 2017-09-14 DIAGNOSIS — Z4931 Encounter for adequacy testing for hemodialysis: Secondary | ICD-10-CM | POA: Diagnosis not present

## 2017-09-14 DIAGNOSIS — Z992 Dependence on renal dialysis: Secondary | ICD-10-CM | POA: Diagnosis not present

## 2017-09-14 DIAGNOSIS — E875 Hyperkalemia: Secondary | ICD-10-CM | POA: Diagnosis not present

## 2017-09-14 DIAGNOSIS — N2589 Other disorders resulting from impaired renal tubular function: Secondary | ICD-10-CM | POA: Diagnosis not present

## 2017-09-14 DIAGNOSIS — D630 Anemia in neoplastic disease: Secondary | ICD-10-CM | POA: Diagnosis not present

## 2017-09-16 DIAGNOSIS — Z4931 Encounter for adequacy testing for hemodialysis: Secondary | ICD-10-CM | POA: Diagnosis not present

## 2017-09-16 DIAGNOSIS — N2581 Secondary hyperparathyroidism of renal origin: Secondary | ICD-10-CM | POA: Diagnosis not present

## 2017-09-16 DIAGNOSIS — M109 Gout, unspecified: Secondary | ICD-10-CM | POA: Diagnosis not present

## 2017-09-16 DIAGNOSIS — E7841 Elevated Lipoprotein(a): Secondary | ICD-10-CM | POA: Diagnosis not present

## 2017-09-16 DIAGNOSIS — N2589 Other disorders resulting from impaired renal tubular function: Secondary | ICD-10-CM | POA: Diagnosis not present

## 2017-09-16 DIAGNOSIS — D509 Iron deficiency anemia, unspecified: Secondary | ICD-10-CM | POA: Diagnosis not present

## 2017-09-16 DIAGNOSIS — D630 Anemia in neoplastic disease: Secondary | ICD-10-CM | POA: Diagnosis not present

## 2017-09-16 DIAGNOSIS — N186 End stage renal disease: Secondary | ICD-10-CM | POA: Diagnosis not present

## 2017-09-18 DIAGNOSIS — N2589 Other disorders resulting from impaired renal tubular function: Secondary | ICD-10-CM | POA: Diagnosis not present

## 2017-09-18 DIAGNOSIS — N186 End stage renal disease: Secondary | ICD-10-CM | POA: Diagnosis not present

## 2017-09-18 DIAGNOSIS — D509 Iron deficiency anemia, unspecified: Secondary | ICD-10-CM | POA: Diagnosis not present

## 2017-09-18 DIAGNOSIS — N2581 Secondary hyperparathyroidism of renal origin: Secondary | ICD-10-CM | POA: Diagnosis not present

## 2017-09-18 DIAGNOSIS — D630 Anemia in neoplastic disease: Secondary | ICD-10-CM | POA: Diagnosis not present

## 2017-09-18 DIAGNOSIS — Z4931 Encounter for adequacy testing for hemodialysis: Secondary | ICD-10-CM | POA: Diagnosis not present

## 2017-09-20 DIAGNOSIS — D630 Anemia in neoplastic disease: Secondary | ICD-10-CM | POA: Diagnosis not present

## 2017-09-20 DIAGNOSIS — N2589 Other disorders resulting from impaired renal tubular function: Secondary | ICD-10-CM | POA: Diagnosis not present

## 2017-09-20 DIAGNOSIS — N186 End stage renal disease: Secondary | ICD-10-CM | POA: Diagnosis not present

## 2017-09-20 DIAGNOSIS — D509 Iron deficiency anemia, unspecified: Secondary | ICD-10-CM | POA: Diagnosis not present

## 2017-09-20 DIAGNOSIS — N2581 Secondary hyperparathyroidism of renal origin: Secondary | ICD-10-CM | POA: Diagnosis not present

## 2017-09-20 DIAGNOSIS — Z4931 Encounter for adequacy testing for hemodialysis: Secondary | ICD-10-CM | POA: Diagnosis not present

## 2017-09-21 DIAGNOSIS — Z4931 Encounter for adequacy testing for hemodialysis: Secondary | ICD-10-CM | POA: Diagnosis not present

## 2017-09-21 DIAGNOSIS — D630 Anemia in neoplastic disease: Secondary | ICD-10-CM | POA: Diagnosis not present

## 2017-09-21 DIAGNOSIS — N2581 Secondary hyperparathyroidism of renal origin: Secondary | ICD-10-CM | POA: Diagnosis not present

## 2017-09-21 DIAGNOSIS — D509 Iron deficiency anemia, unspecified: Secondary | ICD-10-CM | POA: Diagnosis not present

## 2017-09-21 DIAGNOSIS — N186 End stage renal disease: Secondary | ICD-10-CM | POA: Diagnosis not present

## 2017-09-21 DIAGNOSIS — N2589 Other disorders resulting from impaired renal tubular function: Secondary | ICD-10-CM | POA: Diagnosis not present

## 2017-09-23 DIAGNOSIS — D509 Iron deficiency anemia, unspecified: Secondary | ICD-10-CM | POA: Diagnosis not present

## 2017-09-23 DIAGNOSIS — N186 End stage renal disease: Secondary | ICD-10-CM | POA: Diagnosis not present

## 2017-09-23 DIAGNOSIS — Z4931 Encounter for adequacy testing for hemodialysis: Secondary | ICD-10-CM | POA: Diagnosis not present

## 2017-09-23 DIAGNOSIS — D630 Anemia in neoplastic disease: Secondary | ICD-10-CM | POA: Diagnosis not present

## 2017-09-23 DIAGNOSIS — N2589 Other disorders resulting from impaired renal tubular function: Secondary | ICD-10-CM | POA: Diagnosis not present

## 2017-09-23 DIAGNOSIS — N2581 Secondary hyperparathyroidism of renal origin: Secondary | ICD-10-CM | POA: Diagnosis not present

## 2017-09-25 DIAGNOSIS — D509 Iron deficiency anemia, unspecified: Secondary | ICD-10-CM | POA: Diagnosis not present

## 2017-09-25 DIAGNOSIS — D630 Anemia in neoplastic disease: Secondary | ICD-10-CM | POA: Diagnosis not present

## 2017-09-25 DIAGNOSIS — N186 End stage renal disease: Secondary | ICD-10-CM | POA: Diagnosis not present

## 2017-09-25 DIAGNOSIS — N2581 Secondary hyperparathyroidism of renal origin: Secondary | ICD-10-CM | POA: Diagnosis not present

## 2017-09-25 DIAGNOSIS — Z4931 Encounter for adequacy testing for hemodialysis: Secondary | ICD-10-CM | POA: Diagnosis not present

## 2017-09-25 DIAGNOSIS — N2589 Other disorders resulting from impaired renal tubular function: Secondary | ICD-10-CM | POA: Diagnosis not present

## 2017-09-27 DIAGNOSIS — N186 End stage renal disease: Secondary | ICD-10-CM | POA: Diagnosis not present

## 2017-09-27 DIAGNOSIS — N2581 Secondary hyperparathyroidism of renal origin: Secondary | ICD-10-CM | POA: Diagnosis not present

## 2017-09-27 DIAGNOSIS — Z4931 Encounter for adequacy testing for hemodialysis: Secondary | ICD-10-CM | POA: Diagnosis not present

## 2017-09-27 DIAGNOSIS — D509 Iron deficiency anemia, unspecified: Secondary | ICD-10-CM | POA: Diagnosis not present

## 2017-09-27 DIAGNOSIS — D630 Anemia in neoplastic disease: Secondary | ICD-10-CM | POA: Diagnosis not present

## 2017-09-27 DIAGNOSIS — N2589 Other disorders resulting from impaired renal tubular function: Secondary | ICD-10-CM | POA: Diagnosis not present

## 2017-09-28 DIAGNOSIS — D509 Iron deficiency anemia, unspecified: Secondary | ICD-10-CM | POA: Diagnosis not present

## 2017-09-28 DIAGNOSIS — N2581 Secondary hyperparathyroidism of renal origin: Secondary | ICD-10-CM | POA: Diagnosis not present

## 2017-09-28 DIAGNOSIS — N186 End stage renal disease: Secondary | ICD-10-CM | POA: Diagnosis not present

## 2017-09-28 DIAGNOSIS — N2589 Other disorders resulting from impaired renal tubular function: Secondary | ICD-10-CM | POA: Diagnosis not present

## 2017-09-28 DIAGNOSIS — Z4931 Encounter for adequacy testing for hemodialysis: Secondary | ICD-10-CM | POA: Diagnosis not present

## 2017-09-28 DIAGNOSIS — D630 Anemia in neoplastic disease: Secondary | ICD-10-CM | POA: Diagnosis not present

## 2017-09-30 DIAGNOSIS — N186 End stage renal disease: Secondary | ICD-10-CM | POA: Diagnosis not present

## 2017-09-30 DIAGNOSIS — Z4931 Encounter for adequacy testing for hemodialysis: Secondary | ICD-10-CM | POA: Diagnosis not present

## 2017-09-30 DIAGNOSIS — N2581 Secondary hyperparathyroidism of renal origin: Secondary | ICD-10-CM | POA: Diagnosis not present

## 2017-09-30 DIAGNOSIS — D630 Anemia in neoplastic disease: Secondary | ICD-10-CM | POA: Diagnosis not present

## 2017-09-30 DIAGNOSIS — D509 Iron deficiency anemia, unspecified: Secondary | ICD-10-CM | POA: Diagnosis not present

## 2017-09-30 DIAGNOSIS — N2589 Other disorders resulting from impaired renal tubular function: Secondary | ICD-10-CM | POA: Diagnosis not present

## 2017-10-02 DIAGNOSIS — N186 End stage renal disease: Secondary | ICD-10-CM | POA: Diagnosis not present

## 2017-10-02 DIAGNOSIS — N2589 Other disorders resulting from impaired renal tubular function: Secondary | ICD-10-CM | POA: Diagnosis not present

## 2017-10-02 DIAGNOSIS — D509 Iron deficiency anemia, unspecified: Secondary | ICD-10-CM | POA: Diagnosis not present

## 2017-10-02 DIAGNOSIS — Z4931 Encounter for adequacy testing for hemodialysis: Secondary | ICD-10-CM | POA: Diagnosis not present

## 2017-10-02 DIAGNOSIS — D630 Anemia in neoplastic disease: Secondary | ICD-10-CM | POA: Diagnosis not present

## 2017-10-02 DIAGNOSIS — N2581 Secondary hyperparathyroidism of renal origin: Secondary | ICD-10-CM | POA: Diagnosis not present

## 2017-10-03 DIAGNOSIS — N2581 Secondary hyperparathyroidism of renal origin: Secondary | ICD-10-CM | POA: Diagnosis not present

## 2017-10-03 DIAGNOSIS — N186 End stage renal disease: Secondary | ICD-10-CM | POA: Diagnosis not present

## 2017-10-04 DIAGNOSIS — D509 Iron deficiency anemia, unspecified: Secondary | ICD-10-CM | POA: Diagnosis not present

## 2017-10-04 DIAGNOSIS — N186 End stage renal disease: Secondary | ICD-10-CM | POA: Diagnosis not present

## 2017-10-04 DIAGNOSIS — N2589 Other disorders resulting from impaired renal tubular function: Secondary | ICD-10-CM | POA: Diagnosis not present

## 2017-10-04 DIAGNOSIS — D630 Anemia in neoplastic disease: Secondary | ICD-10-CM | POA: Diagnosis not present

## 2017-10-04 DIAGNOSIS — N2581 Secondary hyperparathyroidism of renal origin: Secondary | ICD-10-CM | POA: Diagnosis not present

## 2017-10-04 DIAGNOSIS — Z4931 Encounter for adequacy testing for hemodialysis: Secondary | ICD-10-CM | POA: Diagnosis not present

## 2017-10-05 DIAGNOSIS — N2589 Other disorders resulting from impaired renal tubular function: Secondary | ICD-10-CM | POA: Diagnosis not present

## 2017-10-05 DIAGNOSIS — N186 End stage renal disease: Secondary | ICD-10-CM | POA: Diagnosis not present

## 2017-10-05 DIAGNOSIS — D509 Iron deficiency anemia, unspecified: Secondary | ICD-10-CM | POA: Diagnosis not present

## 2017-10-05 DIAGNOSIS — N2581 Secondary hyperparathyroidism of renal origin: Secondary | ICD-10-CM | POA: Diagnosis not present

## 2017-10-05 DIAGNOSIS — Z4931 Encounter for adequacy testing for hemodialysis: Secondary | ICD-10-CM | POA: Diagnosis not present

## 2017-10-05 DIAGNOSIS — D630 Anemia in neoplastic disease: Secondary | ICD-10-CM | POA: Diagnosis not present

## 2017-10-07 DIAGNOSIS — D630 Anemia in neoplastic disease: Secondary | ICD-10-CM | POA: Diagnosis not present

## 2017-10-07 DIAGNOSIS — Z4931 Encounter for adequacy testing for hemodialysis: Secondary | ICD-10-CM | POA: Diagnosis not present

## 2017-10-07 DIAGNOSIS — N2589 Other disorders resulting from impaired renal tubular function: Secondary | ICD-10-CM | POA: Diagnosis not present

## 2017-10-07 DIAGNOSIS — N2581 Secondary hyperparathyroidism of renal origin: Secondary | ICD-10-CM | POA: Diagnosis not present

## 2017-10-07 DIAGNOSIS — N186 End stage renal disease: Secondary | ICD-10-CM | POA: Diagnosis not present

## 2017-10-07 DIAGNOSIS — D509 Iron deficiency anemia, unspecified: Secondary | ICD-10-CM | POA: Diagnosis not present

## 2017-10-08 DIAGNOSIS — N186 End stage renal disease: Secondary | ICD-10-CM | POA: Diagnosis not present

## 2017-10-08 DIAGNOSIS — N2589 Other disorders resulting from impaired renal tubular function: Secondary | ICD-10-CM | POA: Diagnosis not present

## 2017-10-08 DIAGNOSIS — D509 Iron deficiency anemia, unspecified: Secondary | ICD-10-CM | POA: Diagnosis not present

## 2017-10-08 DIAGNOSIS — D630 Anemia in neoplastic disease: Secondary | ICD-10-CM | POA: Diagnosis not present

## 2017-10-08 DIAGNOSIS — Z4931 Encounter for adequacy testing for hemodialysis: Secondary | ICD-10-CM | POA: Diagnosis not present

## 2017-10-08 DIAGNOSIS — N2581 Secondary hyperparathyroidism of renal origin: Secondary | ICD-10-CM | POA: Diagnosis not present

## 2017-10-09 DIAGNOSIS — D3A010 Benign carcinoid tumor of the duodenum: Secondary | ICD-10-CM | POA: Diagnosis not present

## 2017-10-09 DIAGNOSIS — Z882 Allergy status to sulfonamides status: Secondary | ICD-10-CM | POA: Diagnosis not present

## 2017-10-09 DIAGNOSIS — Z992 Dependence on renal dialysis: Secondary | ICD-10-CM | POA: Diagnosis not present

## 2017-10-09 DIAGNOSIS — K259 Gastric ulcer, unspecified as acute or chronic, without hemorrhage or perforation: Secondary | ICD-10-CM | POA: Diagnosis not present

## 2017-10-09 DIAGNOSIS — R933 Abnormal findings on diagnostic imaging of other parts of digestive tract: Secondary | ICD-10-CM | POA: Diagnosis not present

## 2017-10-09 DIAGNOSIS — N186 End stage renal disease: Secondary | ICD-10-CM | POA: Diagnosis not present

## 2017-10-09 DIAGNOSIS — K317 Polyp of stomach and duodenum: Secondary | ICD-10-CM | POA: Diagnosis not present

## 2017-10-09 DIAGNOSIS — Z79899 Other long term (current) drug therapy: Secondary | ICD-10-CM | POA: Diagnosis not present

## 2017-10-09 DIAGNOSIS — D509 Iron deficiency anemia, unspecified: Secondary | ICD-10-CM | POA: Diagnosis not present

## 2017-10-09 DIAGNOSIS — N2589 Other disorders resulting from impaired renal tubular function: Secondary | ICD-10-CM | POA: Diagnosis not present

## 2017-10-09 DIAGNOSIS — D630 Anemia in neoplastic disease: Secondary | ICD-10-CM | POA: Diagnosis not present

## 2017-10-09 DIAGNOSIS — K3189 Other diseases of stomach and duodenum: Secondary | ICD-10-CM | POA: Diagnosis not present

## 2017-10-09 DIAGNOSIS — I120 Hypertensive chronic kidney disease with stage 5 chronic kidney disease or end stage renal disease: Secondary | ICD-10-CM | POA: Diagnosis not present

## 2017-10-09 DIAGNOSIS — Z4931 Encounter for adequacy testing for hemodialysis: Secondary | ICD-10-CM | POA: Diagnosis not present

## 2017-10-09 DIAGNOSIS — N2581 Secondary hyperparathyroidism of renal origin: Secondary | ICD-10-CM | POA: Diagnosis not present

## 2017-10-11 DIAGNOSIS — N2589 Other disorders resulting from impaired renal tubular function: Secondary | ICD-10-CM | POA: Diagnosis not present

## 2017-10-11 DIAGNOSIS — D509 Iron deficiency anemia, unspecified: Secondary | ICD-10-CM | POA: Diagnosis not present

## 2017-10-11 DIAGNOSIS — N2581 Secondary hyperparathyroidism of renal origin: Secondary | ICD-10-CM | POA: Diagnosis not present

## 2017-10-11 DIAGNOSIS — D630 Anemia in neoplastic disease: Secondary | ICD-10-CM | POA: Diagnosis not present

## 2017-10-11 DIAGNOSIS — N186 End stage renal disease: Secondary | ICD-10-CM | POA: Diagnosis not present

## 2017-10-11 DIAGNOSIS — Z4931 Encounter for adequacy testing for hemodialysis: Secondary | ICD-10-CM | POA: Diagnosis not present

## 2017-10-12 DIAGNOSIS — N2581 Secondary hyperparathyroidism of renal origin: Secondary | ICD-10-CM | POA: Diagnosis not present

## 2017-10-12 DIAGNOSIS — Z4931 Encounter for adequacy testing for hemodialysis: Secondary | ICD-10-CM | POA: Diagnosis not present

## 2017-10-12 DIAGNOSIS — D509 Iron deficiency anemia, unspecified: Secondary | ICD-10-CM | POA: Diagnosis not present

## 2017-10-12 DIAGNOSIS — N186 End stage renal disease: Secondary | ICD-10-CM | POA: Diagnosis not present

## 2017-10-12 DIAGNOSIS — N2589 Other disorders resulting from impaired renal tubular function: Secondary | ICD-10-CM | POA: Diagnosis not present

## 2017-10-12 DIAGNOSIS — D630 Anemia in neoplastic disease: Secondary | ICD-10-CM | POA: Diagnosis not present

## 2017-10-14 DIAGNOSIS — I120 Hypertensive chronic kidney disease with stage 5 chronic kidney disease or end stage renal disease: Secondary | ICD-10-CM | POA: Diagnosis not present

## 2017-10-14 DIAGNOSIS — N186 End stage renal disease: Secondary | ICD-10-CM | POA: Diagnosis not present

## 2017-10-14 DIAGNOSIS — M109 Gout, unspecified: Secondary | ICD-10-CM | POA: Diagnosis not present

## 2017-10-14 DIAGNOSIS — Z992 Dependence on renal dialysis: Secondary | ICD-10-CM | POA: Diagnosis not present

## 2017-10-14 DIAGNOSIS — D509 Iron deficiency anemia, unspecified: Secondary | ICD-10-CM | POA: Diagnosis not present

## 2017-10-14 DIAGNOSIS — E875 Hyperkalemia: Secondary | ICD-10-CM | POA: Diagnosis not present

## 2017-10-14 DIAGNOSIS — E1129 Type 2 diabetes mellitus with other diabetic kidney complication: Secondary | ICD-10-CM | POA: Diagnosis not present

## 2017-10-14 DIAGNOSIS — D631 Anemia in chronic kidney disease: Secondary | ICD-10-CM | POA: Diagnosis not present

## 2017-10-14 DIAGNOSIS — E7841 Elevated Lipoprotein(a): Secondary | ICD-10-CM | POA: Diagnosis not present

## 2017-10-14 DIAGNOSIS — N2589 Other disorders resulting from impaired renal tubular function: Secondary | ICD-10-CM | POA: Diagnosis not present

## 2017-10-14 DIAGNOSIS — Z4931 Encounter for adequacy testing for hemodialysis: Secondary | ICD-10-CM | POA: Diagnosis not present

## 2017-10-14 DIAGNOSIS — D630 Anemia in neoplastic disease: Secondary | ICD-10-CM | POA: Diagnosis not present

## 2017-10-14 DIAGNOSIS — N2581 Secondary hyperparathyroidism of renal origin: Secondary | ICD-10-CM | POA: Diagnosis not present

## 2017-10-16 DIAGNOSIS — N186 End stage renal disease: Secondary | ICD-10-CM | POA: Diagnosis not present

## 2017-10-16 DIAGNOSIS — N2589 Other disorders resulting from impaired renal tubular function: Secondary | ICD-10-CM | POA: Diagnosis not present

## 2017-10-16 DIAGNOSIS — D509 Iron deficiency anemia, unspecified: Secondary | ICD-10-CM | POA: Diagnosis not present

## 2017-10-16 DIAGNOSIS — Z4931 Encounter for adequacy testing for hemodialysis: Secondary | ICD-10-CM | POA: Diagnosis not present

## 2017-10-16 DIAGNOSIS — D630 Anemia in neoplastic disease: Secondary | ICD-10-CM | POA: Diagnosis not present

## 2017-10-16 DIAGNOSIS — N2581 Secondary hyperparathyroidism of renal origin: Secondary | ICD-10-CM | POA: Diagnosis not present

## 2017-10-18 DIAGNOSIS — N186 End stage renal disease: Secondary | ICD-10-CM | POA: Diagnosis not present

## 2017-10-18 DIAGNOSIS — N2589 Other disorders resulting from impaired renal tubular function: Secondary | ICD-10-CM | POA: Diagnosis not present

## 2017-10-18 DIAGNOSIS — D630 Anemia in neoplastic disease: Secondary | ICD-10-CM | POA: Diagnosis not present

## 2017-10-18 DIAGNOSIS — N2581 Secondary hyperparathyroidism of renal origin: Secondary | ICD-10-CM | POA: Diagnosis not present

## 2017-10-18 DIAGNOSIS — D509 Iron deficiency anemia, unspecified: Secondary | ICD-10-CM | POA: Diagnosis not present

## 2017-10-18 DIAGNOSIS — Z4931 Encounter for adequacy testing for hemodialysis: Secondary | ICD-10-CM | POA: Diagnosis not present

## 2017-10-19 DIAGNOSIS — N2581 Secondary hyperparathyroidism of renal origin: Secondary | ICD-10-CM | POA: Diagnosis not present

## 2017-10-19 DIAGNOSIS — D509 Iron deficiency anemia, unspecified: Secondary | ICD-10-CM | POA: Diagnosis not present

## 2017-10-19 DIAGNOSIS — D630 Anemia in neoplastic disease: Secondary | ICD-10-CM | POA: Diagnosis not present

## 2017-10-19 DIAGNOSIS — Z4931 Encounter for adequacy testing for hemodialysis: Secondary | ICD-10-CM | POA: Diagnosis not present

## 2017-10-19 DIAGNOSIS — N186 End stage renal disease: Secondary | ICD-10-CM | POA: Diagnosis not present

## 2017-10-19 DIAGNOSIS — N2589 Other disorders resulting from impaired renal tubular function: Secondary | ICD-10-CM | POA: Diagnosis not present

## 2017-10-21 DIAGNOSIS — D630 Anemia in neoplastic disease: Secondary | ICD-10-CM | POA: Diagnosis not present

## 2017-10-21 DIAGNOSIS — N2581 Secondary hyperparathyroidism of renal origin: Secondary | ICD-10-CM | POA: Diagnosis not present

## 2017-10-21 DIAGNOSIS — Z4931 Encounter for adequacy testing for hemodialysis: Secondary | ICD-10-CM | POA: Diagnosis not present

## 2017-10-21 DIAGNOSIS — D509 Iron deficiency anemia, unspecified: Secondary | ICD-10-CM | POA: Diagnosis not present

## 2017-10-21 DIAGNOSIS — N186 End stage renal disease: Secondary | ICD-10-CM | POA: Diagnosis not present

## 2017-10-21 DIAGNOSIS — N2589 Other disorders resulting from impaired renal tubular function: Secondary | ICD-10-CM | POA: Diagnosis not present

## 2017-10-23 DIAGNOSIS — N186 End stage renal disease: Secondary | ICD-10-CM | POA: Diagnosis not present

## 2017-10-23 DIAGNOSIS — N2589 Other disorders resulting from impaired renal tubular function: Secondary | ICD-10-CM | POA: Diagnosis not present

## 2017-10-23 DIAGNOSIS — D630 Anemia in neoplastic disease: Secondary | ICD-10-CM | POA: Diagnosis not present

## 2017-10-23 DIAGNOSIS — D509 Iron deficiency anemia, unspecified: Secondary | ICD-10-CM | POA: Diagnosis not present

## 2017-10-23 DIAGNOSIS — Z4931 Encounter for adequacy testing for hemodialysis: Secondary | ICD-10-CM | POA: Diagnosis not present

## 2017-10-23 DIAGNOSIS — N2581 Secondary hyperparathyroidism of renal origin: Secondary | ICD-10-CM | POA: Diagnosis not present

## 2017-10-25 DIAGNOSIS — N2581 Secondary hyperparathyroidism of renal origin: Secondary | ICD-10-CM | POA: Diagnosis not present

## 2017-10-25 DIAGNOSIS — N2589 Other disorders resulting from impaired renal tubular function: Secondary | ICD-10-CM | POA: Diagnosis not present

## 2017-10-25 DIAGNOSIS — Z4931 Encounter for adequacy testing for hemodialysis: Secondary | ICD-10-CM | POA: Diagnosis not present

## 2017-10-25 DIAGNOSIS — D509 Iron deficiency anemia, unspecified: Secondary | ICD-10-CM | POA: Diagnosis not present

## 2017-10-25 DIAGNOSIS — D630 Anemia in neoplastic disease: Secondary | ICD-10-CM | POA: Diagnosis not present

## 2017-10-25 DIAGNOSIS — N186 End stage renal disease: Secondary | ICD-10-CM | POA: Diagnosis not present

## 2017-10-26 DIAGNOSIS — N2589 Other disorders resulting from impaired renal tubular function: Secondary | ICD-10-CM | POA: Diagnosis not present

## 2017-10-26 DIAGNOSIS — D509 Iron deficiency anemia, unspecified: Secondary | ICD-10-CM | POA: Diagnosis not present

## 2017-10-26 DIAGNOSIS — N186 End stage renal disease: Secondary | ICD-10-CM | POA: Diagnosis not present

## 2017-10-26 DIAGNOSIS — N2581 Secondary hyperparathyroidism of renal origin: Secondary | ICD-10-CM | POA: Diagnosis not present

## 2017-10-26 DIAGNOSIS — Z4931 Encounter for adequacy testing for hemodialysis: Secondary | ICD-10-CM | POA: Diagnosis not present

## 2017-10-26 DIAGNOSIS — D630 Anemia in neoplastic disease: Secondary | ICD-10-CM | POA: Diagnosis not present

## 2017-10-27 DIAGNOSIS — N2589 Other disorders resulting from impaired renal tubular function: Secondary | ICD-10-CM | POA: Diagnosis not present

## 2017-10-27 DIAGNOSIS — Z4931 Encounter for adequacy testing for hemodialysis: Secondary | ICD-10-CM | POA: Diagnosis not present

## 2017-10-27 DIAGNOSIS — N2581 Secondary hyperparathyroidism of renal origin: Secondary | ICD-10-CM | POA: Diagnosis not present

## 2017-10-27 DIAGNOSIS — D630 Anemia in neoplastic disease: Secondary | ICD-10-CM | POA: Diagnosis not present

## 2017-10-27 DIAGNOSIS — D509 Iron deficiency anemia, unspecified: Secondary | ICD-10-CM | POA: Diagnosis not present

## 2017-10-27 DIAGNOSIS — N186 End stage renal disease: Secondary | ICD-10-CM | POA: Diagnosis not present

## 2017-10-28 DIAGNOSIS — D630 Anemia in neoplastic disease: Secondary | ICD-10-CM | POA: Diagnosis not present

## 2017-10-28 DIAGNOSIS — N2589 Other disorders resulting from impaired renal tubular function: Secondary | ICD-10-CM | POA: Diagnosis not present

## 2017-10-28 DIAGNOSIS — N2581 Secondary hyperparathyroidism of renal origin: Secondary | ICD-10-CM | POA: Diagnosis not present

## 2017-10-28 DIAGNOSIS — N186 End stage renal disease: Secondary | ICD-10-CM | POA: Diagnosis not present

## 2017-10-28 DIAGNOSIS — Z4931 Encounter for adequacy testing for hemodialysis: Secondary | ICD-10-CM | POA: Diagnosis not present

## 2017-10-28 DIAGNOSIS — D509 Iron deficiency anemia, unspecified: Secondary | ICD-10-CM | POA: Diagnosis not present

## 2017-10-30 DIAGNOSIS — D509 Iron deficiency anemia, unspecified: Secondary | ICD-10-CM | POA: Diagnosis not present

## 2017-10-30 DIAGNOSIS — N186 End stage renal disease: Secondary | ICD-10-CM | POA: Diagnosis not present

## 2017-10-30 DIAGNOSIS — Z4931 Encounter for adequacy testing for hemodialysis: Secondary | ICD-10-CM | POA: Diagnosis not present

## 2017-10-30 DIAGNOSIS — D630 Anemia in neoplastic disease: Secondary | ICD-10-CM | POA: Diagnosis not present

## 2017-10-30 DIAGNOSIS — N2581 Secondary hyperparathyroidism of renal origin: Secondary | ICD-10-CM | POA: Diagnosis not present

## 2017-10-30 DIAGNOSIS — N2589 Other disorders resulting from impaired renal tubular function: Secondary | ICD-10-CM | POA: Diagnosis not present

## 2017-11-01 DIAGNOSIS — N186 End stage renal disease: Secondary | ICD-10-CM | POA: Diagnosis not present

## 2017-11-01 DIAGNOSIS — D630 Anemia in neoplastic disease: Secondary | ICD-10-CM | POA: Diagnosis not present

## 2017-11-01 DIAGNOSIS — Z4931 Encounter for adequacy testing for hemodialysis: Secondary | ICD-10-CM | POA: Diagnosis not present

## 2017-11-01 DIAGNOSIS — D509 Iron deficiency anemia, unspecified: Secondary | ICD-10-CM | POA: Diagnosis not present

## 2017-11-01 DIAGNOSIS — N2589 Other disorders resulting from impaired renal tubular function: Secondary | ICD-10-CM | POA: Diagnosis not present

## 2017-11-01 DIAGNOSIS — N2581 Secondary hyperparathyroidism of renal origin: Secondary | ICD-10-CM | POA: Diagnosis not present

## 2017-11-02 DIAGNOSIS — N186 End stage renal disease: Secondary | ICD-10-CM | POA: Diagnosis not present

## 2017-11-02 DIAGNOSIS — Z4931 Encounter for adequacy testing for hemodialysis: Secondary | ICD-10-CM | POA: Diagnosis not present

## 2017-11-02 DIAGNOSIS — N2581 Secondary hyperparathyroidism of renal origin: Secondary | ICD-10-CM | POA: Diagnosis not present

## 2017-11-02 DIAGNOSIS — D630 Anemia in neoplastic disease: Secondary | ICD-10-CM | POA: Diagnosis not present

## 2017-11-02 DIAGNOSIS — N2589 Other disorders resulting from impaired renal tubular function: Secondary | ICD-10-CM | POA: Diagnosis not present

## 2017-11-02 DIAGNOSIS — D509 Iron deficiency anemia, unspecified: Secondary | ICD-10-CM | POA: Diagnosis not present

## 2017-11-04 DIAGNOSIS — N2589 Other disorders resulting from impaired renal tubular function: Secondary | ICD-10-CM | POA: Diagnosis not present

## 2017-11-04 DIAGNOSIS — C7B8 Other secondary neuroendocrine tumors: Secondary | ICD-10-CM | POA: Diagnosis not present

## 2017-11-04 DIAGNOSIS — D509 Iron deficiency anemia, unspecified: Secondary | ICD-10-CM | POA: Diagnosis not present

## 2017-11-04 DIAGNOSIS — Z4931 Encounter for adequacy testing for hemodialysis: Secondary | ICD-10-CM | POA: Diagnosis not present

## 2017-11-04 DIAGNOSIS — D630 Anemia in neoplastic disease: Secondary | ICD-10-CM | POA: Diagnosis not present

## 2017-11-04 DIAGNOSIS — C7A8 Other malignant neuroendocrine tumors: Secondary | ICD-10-CM | POA: Diagnosis not present

## 2017-11-04 DIAGNOSIS — R0602 Shortness of breath: Secondary | ICD-10-CM | POA: Diagnosis not present

## 2017-11-04 DIAGNOSIS — Z992 Dependence on renal dialysis: Secondary | ICD-10-CM | POA: Diagnosis not present

## 2017-11-04 DIAGNOSIS — N2581 Secondary hyperparathyroidism of renal origin: Secondary | ICD-10-CM | POA: Diagnosis not present

## 2017-11-04 DIAGNOSIS — R42 Dizziness and giddiness: Secondary | ICD-10-CM | POA: Diagnosis not present

## 2017-11-04 DIAGNOSIS — R51 Headache: Secondary | ICD-10-CM | POA: Diagnosis not present

## 2017-11-04 DIAGNOSIS — L309 Dermatitis, unspecified: Secondary | ICD-10-CM | POA: Diagnosis not present

## 2017-11-04 DIAGNOSIS — N186 End stage renal disease: Secondary | ICD-10-CM | POA: Diagnosis not present

## 2017-11-06 DIAGNOSIS — D630 Anemia in neoplastic disease: Secondary | ICD-10-CM | POA: Diagnosis not present

## 2017-11-06 DIAGNOSIS — D509 Iron deficiency anemia, unspecified: Secondary | ICD-10-CM | POA: Diagnosis not present

## 2017-11-06 DIAGNOSIS — N2581 Secondary hyperparathyroidism of renal origin: Secondary | ICD-10-CM | POA: Diagnosis not present

## 2017-11-06 DIAGNOSIS — N186 End stage renal disease: Secondary | ICD-10-CM | POA: Diagnosis not present

## 2017-11-06 DIAGNOSIS — Z4931 Encounter for adequacy testing for hemodialysis: Secondary | ICD-10-CM | POA: Diagnosis not present

## 2017-11-06 DIAGNOSIS — N2589 Other disorders resulting from impaired renal tubular function: Secondary | ICD-10-CM | POA: Diagnosis not present

## 2017-11-08 DIAGNOSIS — Z4931 Encounter for adequacy testing for hemodialysis: Secondary | ICD-10-CM | POA: Diagnosis not present

## 2017-11-08 DIAGNOSIS — N186 End stage renal disease: Secondary | ICD-10-CM | POA: Diagnosis not present

## 2017-11-08 DIAGNOSIS — D630 Anemia in neoplastic disease: Secondary | ICD-10-CM | POA: Diagnosis not present

## 2017-11-08 DIAGNOSIS — D509 Iron deficiency anemia, unspecified: Secondary | ICD-10-CM | POA: Diagnosis not present

## 2017-11-08 DIAGNOSIS — N2589 Other disorders resulting from impaired renal tubular function: Secondary | ICD-10-CM | POA: Diagnosis not present

## 2017-11-08 DIAGNOSIS — N2581 Secondary hyperparathyroidism of renal origin: Secondary | ICD-10-CM | POA: Diagnosis not present

## 2017-11-09 DIAGNOSIS — N186 End stage renal disease: Secondary | ICD-10-CM | POA: Diagnosis not present

## 2017-11-09 DIAGNOSIS — N2581 Secondary hyperparathyroidism of renal origin: Secondary | ICD-10-CM | POA: Diagnosis not present

## 2017-11-09 DIAGNOSIS — D509 Iron deficiency anemia, unspecified: Secondary | ICD-10-CM | POA: Diagnosis not present

## 2017-11-09 DIAGNOSIS — N2589 Other disorders resulting from impaired renal tubular function: Secondary | ICD-10-CM | POA: Diagnosis not present

## 2017-11-09 DIAGNOSIS — D630 Anemia in neoplastic disease: Secondary | ICD-10-CM | POA: Diagnosis not present

## 2017-11-09 DIAGNOSIS — Z4931 Encounter for adequacy testing for hemodialysis: Secondary | ICD-10-CM | POA: Diagnosis not present

## 2017-11-11 DIAGNOSIS — N2589 Other disorders resulting from impaired renal tubular function: Secondary | ICD-10-CM | POA: Diagnosis not present

## 2017-11-11 DIAGNOSIS — N186 End stage renal disease: Secondary | ICD-10-CM | POA: Diagnosis not present

## 2017-11-11 DIAGNOSIS — D630 Anemia in neoplastic disease: Secondary | ICD-10-CM | POA: Diagnosis not present

## 2017-11-11 DIAGNOSIS — Z4931 Encounter for adequacy testing for hemodialysis: Secondary | ICD-10-CM | POA: Diagnosis not present

## 2017-11-11 DIAGNOSIS — D509 Iron deficiency anemia, unspecified: Secondary | ICD-10-CM | POA: Diagnosis not present

## 2017-11-11 DIAGNOSIS — N2581 Secondary hyperparathyroidism of renal origin: Secondary | ICD-10-CM | POA: Diagnosis not present

## 2017-11-13 DIAGNOSIS — Z4931 Encounter for adequacy testing for hemodialysis: Secondary | ICD-10-CM | POA: Diagnosis not present

## 2017-11-13 DIAGNOSIS — D509 Iron deficiency anemia, unspecified: Secondary | ICD-10-CM | POA: Diagnosis not present

## 2017-11-13 DIAGNOSIS — N2589 Other disorders resulting from impaired renal tubular function: Secondary | ICD-10-CM | POA: Diagnosis not present

## 2017-11-13 DIAGNOSIS — N186 End stage renal disease: Secondary | ICD-10-CM | POA: Diagnosis not present

## 2017-11-13 DIAGNOSIS — D630 Anemia in neoplastic disease: Secondary | ICD-10-CM | POA: Diagnosis not present

## 2017-11-13 DIAGNOSIS — N2581 Secondary hyperparathyroidism of renal origin: Secondary | ICD-10-CM | POA: Diagnosis not present

## 2017-11-14 DIAGNOSIS — D631 Anemia in chronic kidney disease: Secondary | ICD-10-CM | POA: Diagnosis not present

## 2017-11-14 DIAGNOSIS — N186 End stage renal disease: Secondary | ICD-10-CM | POA: Diagnosis not present

## 2017-11-14 DIAGNOSIS — Z882 Allergy status to sulfonamides status: Secondary | ICD-10-CM | POA: Diagnosis not present

## 2017-11-14 DIAGNOSIS — C7A8 Other malignant neuroendocrine tumors: Secondary | ICD-10-CM | POA: Diagnosis not present

## 2017-11-14 DIAGNOSIS — R933 Abnormal findings on diagnostic imaging of other parts of digestive tract: Secondary | ICD-10-CM | POA: Diagnosis not present

## 2017-11-14 DIAGNOSIS — N2581 Secondary hyperparathyroidism of renal origin: Secondary | ICD-10-CM | POA: Diagnosis not present

## 2017-11-14 DIAGNOSIS — Z992 Dependence on renal dialysis: Secondary | ICD-10-CM | POA: Diagnosis not present

## 2017-11-14 DIAGNOSIS — E039 Hypothyroidism, unspecified: Secondary | ICD-10-CM | POA: Diagnosis not present

## 2017-11-14 DIAGNOSIS — K269 Duodenal ulcer, unspecified as acute or chronic, without hemorrhage or perforation: Secondary | ICD-10-CM | POA: Diagnosis not present

## 2017-11-14 DIAGNOSIS — K2980 Duodenitis without bleeding: Secondary | ICD-10-CM | POA: Diagnosis not present

## 2017-11-14 DIAGNOSIS — I120 Hypertensive chronic kidney disease with stage 5 chronic kidney disease or end stage renal disease: Secondary | ICD-10-CM | POA: Diagnosis not present

## 2017-11-14 DIAGNOSIS — N2589 Other disorders resulting from impaired renal tubular function: Secondary | ICD-10-CM | POA: Diagnosis not present

## 2017-11-14 DIAGNOSIS — K3189 Other diseases of stomach and duodenum: Secondary | ICD-10-CM | POA: Diagnosis not present

## 2017-11-14 DIAGNOSIS — K264 Chronic or unspecified duodenal ulcer with hemorrhage: Secondary | ICD-10-CM | POA: Diagnosis not present

## 2017-11-14 DIAGNOSIS — Z4931 Encounter for adequacy testing for hemodialysis: Secondary | ICD-10-CM | POA: Diagnosis not present

## 2017-11-14 DIAGNOSIS — Z79899 Other long term (current) drug therapy: Secondary | ICD-10-CM | POA: Diagnosis not present

## 2017-11-14 DIAGNOSIS — R17 Unspecified jaundice: Secondary | ICD-10-CM | POA: Diagnosis not present

## 2017-11-14 NOTE — Progress Notes (Deleted)
Office Visit Note  Patient: Colin Mcfarland             Date of Birth: 08/08/1965           MRN: [id number]             PCP: [name], MD Referring: [name], MD Visit Date: 11/28/2017 Occupation: @GUAROCC @  Subjective:  No chief complaint on file.   History of Present Illness: Colin Mcfarland is a 52 y.o. male ***   Activities of Daily Living:  Patient reports morning stiffness for *** {minute/hour:19697}.   Patient {ACTIONS;DENIES/REPORTS:21021675::"Denies"} nocturnal pain.  Difficulty dressing/grooming: {ACTIONS;DENIES/REPORTS:21021675::"Denies"} Difficulty climbing stairs: {ACTIONS;DENIES/REPORTS:21021675::"Denies"} Difficulty getting out of chair: {ACTIONS;DENIES/REPORTS:21021675::"Denies"} Difficulty using hands for taps, buttons, cutlery, and/or writing: {ACTIONS;DENIES/REPORTS:21021675::"Denies"}  No Rheumatology ROS completed.   PMFS History:  Patient Active Problem List   Diagnosis Date Noted  . Pneumonia 04/28/2017  . GERD (gastroesophageal reflux disease) 11/22/2015  . Shoulder impingement, left 11/22/2015  . Osteoarthritis of both feet 11/22/2015  . Osteoarthritis of both knees 11/22/2015  . 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  . Pain of left upper extremity 07/21/2013  . 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 Murmur 04/12/2013  . Syncope 04/12/2013  . Chronic kidney disease (CKD), stage IV (severe) ([place]) 03/02/2013  . End stage renal disease ([place]) 02/25/2012  . HYPOTHYROIDISM 01/14/2007  . Gout 01/14/2007  . HYPERTENSION 01/14/2007  . GASTRITIS, CHRONIC 01/14/2007  . IGA NEPHROPATHY 01/14/2007  . OTHER DYSPHAGIA 01/14/2007  . DIARRHEA 01/14/2007  . HELICOBACTER PYLORI INFECTION, HX OF 01/14/2007    Past Medical History:  Diagnosis Date  . Anemia   . Arthritis   . 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 Chronic kidney disease   . Eczema    hx: of  . GERD  (gastroesophageal reflux disease)   . Hypertension   . Hypothyroidism   . 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  . Osteoarthritis of both feet 11/22/2015  . Osteoarthritis of both knees 11/22/2015  . Pneumonia   . Shoulder impingement, left 11/22/2015    Family History  Problem Relation Age of Onset  . Diabetes Father   . Hypertension Father   . Cancer Mother   . Diabetes Brother    Past Surgical History:  Procedure Laterality Date  . AV FISTULA PLACEMENT Left 03/18/2013   Procedure: ARTERIOVENOUS (AV) FISTULA CREATION- BRACHIOCEPHALIC;  Surgeon: Mal Misty, MD;  Location: [facility];  Service: Vascular;  Laterality: Left;  . HEMORRHOID SURGERY    . RENAL BIOPSY    . SHOULDER SURGERY     Social History   Social History Narrative  . Not on file    Objective: Vital Signs: There were no vitals taken for this visit.   Physical Exam   Musculoskeletal Exam: ***  CDAI Exam: CDAI Score: Not documented Patient Global Assessment: Not documented; Provider Global Assessment: Not documented Swollen: Not documented; Tender: Not documented Joint Exam   Not documented   There is currently no information documented on the homunculus. Go to the Rheumatology activity and complete the homunculus joint exam.  Investigation: No additional findings.  Imaging: No results found.  Recent Labs: Lab Results  Component Value Date   WBC 8.5 09/06/2017   HGB 13.1 09/06/2017   PLT 153 09/06/2017   NA 135 09/06/2017   K 3.8 09/06/2017   CL 98 09/06/2017   CO2 23 09/06/2017   GLUCOSE 110 (H) 09/06/2017   BUN 40 (H) 09/06/2017  CREATININE 7.40 (H) 09/06/2017   BILITOT 0.5 12/15/2015   ALKPHOS 51 12/15/2015   AST 26 12/15/2015   ALT 21 12/15/2015   PROT 6.8 12/15/2015   ALBUMIN 4.4 12/15/2015   CALCIUM 8.9 09/06/2017   GFRAA 9 (L) 09/06/2017    Speciality Comments: No specialty comments available.  Procedures:  No procedures performed Allergies: Blue dyes (parenteral); Uloric [febuxostat];  and Sulfa antibiotics   Assessment / Plan:     Visit Diagnoses: No diagnosis found.   Orders: No orders of the defined types were placed in this encounter.  No orders of the defined types were placed in this encounter.   Face-to-face time spent with patient was *** minutes. Greater than 50% of time was spent in counseling and coordination of care.  Follow-Up Instructions: No follow-ups on file.   Earnestine Mealing, CMA  Note - This record has been created using Editor, commissioning.  Chart creation errors have been sought, but may not always  have been located. Such creation errors do not reflect on  the standard of medical care.

## 2017-11-15 DIAGNOSIS — I120 Hypertensive chronic kidney disease with stage 5 chronic kidney disease or end stage renal disease: Secondary | ICD-10-CM | POA: Diagnosis not present

## 2017-11-15 DIAGNOSIS — R17 Unspecified jaundice: Secondary | ICD-10-CM | POA: Diagnosis not present

## 2017-11-15 DIAGNOSIS — N2581 Secondary hyperparathyroidism of renal origin: Secondary | ICD-10-CM | POA: Diagnosis not present

## 2017-11-15 DIAGNOSIS — D630 Anemia in neoplastic disease: Secondary | ICD-10-CM | POA: Diagnosis not present

## 2017-11-15 DIAGNOSIS — Z79899 Other long term (current) drug therapy: Secondary | ICD-10-CM | POA: Diagnosis not present

## 2017-11-15 DIAGNOSIS — D631 Anemia in chronic kidney disease: Secondary | ICD-10-CM | POA: Diagnosis not present

## 2017-11-15 DIAGNOSIS — D509 Iron deficiency anemia, unspecified: Secondary | ICD-10-CM | POA: Diagnosis not present

## 2017-11-15 DIAGNOSIS — E875 Hyperkalemia: Secondary | ICD-10-CM | POA: Diagnosis not present

## 2017-11-15 DIAGNOSIS — Z4931 Encounter for adequacy testing for hemodialysis: Secondary | ICD-10-CM | POA: Diagnosis not present

## 2017-11-15 DIAGNOSIS — N186 End stage renal disease: Secondary | ICD-10-CM | POA: Diagnosis not present

## 2017-11-15 DIAGNOSIS — E440 Moderate protein-calorie malnutrition: Secondary | ICD-10-CM | POA: Diagnosis not present

## 2017-11-15 DIAGNOSIS — Z992 Dependence on renal dialysis: Secondary | ICD-10-CM | POA: Diagnosis not present

## 2017-11-16 DIAGNOSIS — R17 Unspecified jaundice: Secondary | ICD-10-CM | POA: Diagnosis not present

## 2017-11-16 DIAGNOSIS — D631 Anemia in chronic kidney disease: Secondary | ICD-10-CM | POA: Diagnosis not present

## 2017-11-16 DIAGNOSIS — N2581 Secondary hyperparathyroidism of renal origin: Secondary | ICD-10-CM | POA: Diagnosis not present

## 2017-11-16 DIAGNOSIS — Z4931 Encounter for adequacy testing for hemodialysis: Secondary | ICD-10-CM | POA: Diagnosis not present

## 2017-11-16 DIAGNOSIS — N186 End stage renal disease: Secondary | ICD-10-CM | POA: Diagnosis not present

## 2017-11-16 DIAGNOSIS — Z79899 Other long term (current) drug therapy: Secondary | ICD-10-CM | POA: Diagnosis not present

## 2017-11-17 DIAGNOSIS — R17 Unspecified jaundice: Secondary | ICD-10-CM | POA: Diagnosis not present

## 2017-11-17 DIAGNOSIS — D631 Anemia in chronic kidney disease: Secondary | ICD-10-CM | POA: Diagnosis not present

## 2017-11-17 DIAGNOSIS — N2581 Secondary hyperparathyroidism of renal origin: Secondary | ICD-10-CM | POA: Diagnosis not present

## 2017-11-17 DIAGNOSIS — Z4931 Encounter for adequacy testing for hemodialysis: Secondary | ICD-10-CM | POA: Diagnosis not present

## 2017-11-17 DIAGNOSIS — M109 Gout, unspecified: Secondary | ICD-10-CM | POA: Diagnosis not present

## 2017-11-17 DIAGNOSIS — Z79899 Other long term (current) drug therapy: Secondary | ICD-10-CM | POA: Diagnosis not present

## 2017-11-17 DIAGNOSIS — N186 End stage renal disease: Secondary | ICD-10-CM | POA: Diagnosis not present

## 2017-11-18 DIAGNOSIS — R17 Unspecified jaundice: Secondary | ICD-10-CM | POA: Diagnosis not present

## 2017-11-18 DIAGNOSIS — N186 End stage renal disease: Secondary | ICD-10-CM | POA: Diagnosis not present

## 2017-11-18 DIAGNOSIS — D631 Anemia in chronic kidney disease: Secondary | ICD-10-CM | POA: Diagnosis not present

## 2017-11-18 DIAGNOSIS — Z79899 Other long term (current) drug therapy: Secondary | ICD-10-CM | POA: Diagnosis not present

## 2017-11-18 DIAGNOSIS — Z4931 Encounter for adequacy testing for hemodialysis: Secondary | ICD-10-CM | POA: Diagnosis not present

## 2017-11-18 DIAGNOSIS — N2581 Secondary hyperparathyroidism of renal origin: Secondary | ICD-10-CM | POA: Diagnosis not present

## 2017-11-19 DIAGNOSIS — R17 Unspecified jaundice: Secondary | ICD-10-CM | POA: Diagnosis not present

## 2017-11-19 DIAGNOSIS — N2581 Secondary hyperparathyroidism of renal origin: Secondary | ICD-10-CM | POA: Diagnosis not present

## 2017-11-19 DIAGNOSIS — Z4931 Encounter for adequacy testing for hemodialysis: Secondary | ICD-10-CM | POA: Diagnosis not present

## 2017-11-19 DIAGNOSIS — Z79899 Other long term (current) drug therapy: Secondary | ICD-10-CM | POA: Diagnosis not present

## 2017-11-19 DIAGNOSIS — N186 End stage renal disease: Secondary | ICD-10-CM | POA: Diagnosis not present

## 2017-11-19 DIAGNOSIS — D631 Anemia in chronic kidney disease: Secondary | ICD-10-CM | POA: Diagnosis not present

## 2017-11-20 DIAGNOSIS — N186 End stage renal disease: Secondary | ICD-10-CM | POA: Diagnosis not present

## 2017-11-20 DIAGNOSIS — R17 Unspecified jaundice: Secondary | ICD-10-CM | POA: Diagnosis not present

## 2017-11-20 DIAGNOSIS — Z79899 Other long term (current) drug therapy: Secondary | ICD-10-CM | POA: Diagnosis not present

## 2017-11-20 DIAGNOSIS — D631 Anemia in chronic kidney disease: Secondary | ICD-10-CM | POA: Diagnosis not present

## 2017-11-20 DIAGNOSIS — Z4931 Encounter for adequacy testing for hemodialysis: Secondary | ICD-10-CM | POA: Diagnosis not present

## 2017-11-20 DIAGNOSIS — N2581 Secondary hyperparathyroidism of renal origin: Secondary | ICD-10-CM | POA: Diagnosis not present

## 2017-11-22 DIAGNOSIS — Z79899 Other long term (current) drug therapy: Secondary | ICD-10-CM | POA: Diagnosis not present

## 2017-11-22 DIAGNOSIS — N2581 Secondary hyperparathyroidism of renal origin: Secondary | ICD-10-CM | POA: Diagnosis not present

## 2017-11-22 DIAGNOSIS — N186 End stage renal disease: Secondary | ICD-10-CM | POA: Diagnosis not present

## 2017-11-22 DIAGNOSIS — D631 Anemia in chronic kidney disease: Secondary | ICD-10-CM | POA: Diagnosis not present

## 2017-11-22 DIAGNOSIS — Z4931 Encounter for adequacy testing for hemodialysis: Secondary | ICD-10-CM | POA: Diagnosis not present

## 2017-11-22 DIAGNOSIS — R17 Unspecified jaundice: Secondary | ICD-10-CM | POA: Diagnosis not present

## 2017-11-23 DIAGNOSIS — N186 End stage renal disease: Secondary | ICD-10-CM | POA: Diagnosis not present

## 2017-11-23 DIAGNOSIS — D631 Anemia in chronic kidney disease: Secondary | ICD-10-CM | POA: Diagnosis not present

## 2017-11-23 DIAGNOSIS — R17 Unspecified jaundice: Secondary | ICD-10-CM | POA: Diagnosis not present

## 2017-11-23 DIAGNOSIS — Z79899 Other long term (current) drug therapy: Secondary | ICD-10-CM | POA: Diagnosis not present

## 2017-11-23 DIAGNOSIS — Z4931 Encounter for adequacy testing for hemodialysis: Secondary | ICD-10-CM | POA: Diagnosis not present

## 2017-11-23 DIAGNOSIS — N2581 Secondary hyperparathyroidism of renal origin: Secondary | ICD-10-CM | POA: Diagnosis not present

## 2017-11-25 DIAGNOSIS — N186 End stage renal disease: Secondary | ICD-10-CM | POA: Diagnosis not present

## 2017-11-25 DIAGNOSIS — R17 Unspecified jaundice: Secondary | ICD-10-CM | POA: Diagnosis not present

## 2017-11-25 DIAGNOSIS — Z79899 Other long term (current) drug therapy: Secondary | ICD-10-CM | POA: Diagnosis not present

## 2017-11-25 DIAGNOSIS — N2581 Secondary hyperparathyroidism of renal origin: Secondary | ICD-10-CM | POA: Diagnosis not present

## 2017-11-25 DIAGNOSIS — D631 Anemia in chronic kidney disease: Secondary | ICD-10-CM | POA: Diagnosis not present

## 2017-11-25 DIAGNOSIS — Z4931 Encounter for adequacy testing for hemodialysis: Secondary | ICD-10-CM | POA: Diagnosis not present

## 2017-11-26 DIAGNOSIS — R17 Unspecified jaundice: Secondary | ICD-10-CM | POA: Diagnosis not present

## 2017-11-26 DIAGNOSIS — N2581 Secondary hyperparathyroidism of renal origin: Secondary | ICD-10-CM | POA: Diagnosis not present

## 2017-11-26 DIAGNOSIS — Z79899 Other long term (current) drug therapy: Secondary | ICD-10-CM | POA: Diagnosis not present

## 2017-11-26 DIAGNOSIS — D631 Anemia in chronic kidney disease: Secondary | ICD-10-CM | POA: Diagnosis not present

## 2017-11-26 DIAGNOSIS — Z4931 Encounter for adequacy testing for hemodialysis: Secondary | ICD-10-CM | POA: Diagnosis not present

## 2017-11-26 DIAGNOSIS — N186 End stage renal disease: Secondary | ICD-10-CM | POA: Diagnosis not present

## 2017-11-27 DIAGNOSIS — R17 Unspecified jaundice: Secondary | ICD-10-CM | POA: Diagnosis not present

## 2017-11-27 DIAGNOSIS — H43813 Vitreous degeneration, bilateral: Secondary | ICD-10-CM | POA: Diagnosis not present

## 2017-11-27 DIAGNOSIS — N186 End stage renal disease: Secondary | ICD-10-CM | POA: Diagnosis not present

## 2017-11-27 DIAGNOSIS — H2513 Age-related nuclear cataract, bilateral: Secondary | ICD-10-CM | POA: Diagnosis not present

## 2017-11-27 DIAGNOSIS — D631 Anemia in chronic kidney disease: Secondary | ICD-10-CM | POA: Diagnosis not present

## 2017-11-27 DIAGNOSIS — Z4931 Encounter for adequacy testing for hemodialysis: Secondary | ICD-10-CM | POA: Diagnosis not present

## 2017-11-27 DIAGNOSIS — H05242 Constant exophthalmos, left eye: Secondary | ICD-10-CM | POA: Diagnosis not present

## 2017-11-27 DIAGNOSIS — N2581 Secondary hyperparathyroidism of renal origin: Secondary | ICD-10-CM | POA: Diagnosis not present

## 2017-11-27 DIAGNOSIS — Z79899 Other long term (current) drug therapy: Secondary | ICD-10-CM | POA: Diagnosis not present

## 2017-11-28 ENCOUNTER — Ambulatory Visit: Payer: 59 | Admitting: Rheumatology

## 2017-11-28 DIAGNOSIS — H40013 Open angle with borderline findings, low risk, bilateral: Secondary | ICD-10-CM | POA: Diagnosis not present

## 2017-11-28 DIAGNOSIS — Z992 Dependence on renal dialysis: Secondary | ICD-10-CM | POA: Diagnosis not present

## 2017-11-28 DIAGNOSIS — I871 Compression of vein: Secondary | ICD-10-CM | POA: Diagnosis not present

## 2017-11-28 DIAGNOSIS — N186 End stage renal disease: Secondary | ICD-10-CM | POA: Diagnosis not present

## 2017-11-28 DIAGNOSIS — H2513 Age-related nuclear cataract, bilateral: Secondary | ICD-10-CM | POA: Diagnosis not present

## 2017-11-28 DIAGNOSIS — H2511 Age-related nuclear cataract, right eye: Secondary | ICD-10-CM | POA: Diagnosis not present

## 2017-11-29 DIAGNOSIS — N2581 Secondary hyperparathyroidism of renal origin: Secondary | ICD-10-CM | POA: Diagnosis not present

## 2017-11-29 DIAGNOSIS — N186 End stage renal disease: Secondary | ICD-10-CM | POA: Diagnosis not present

## 2017-11-29 DIAGNOSIS — R17 Unspecified jaundice: Secondary | ICD-10-CM | POA: Diagnosis not present

## 2017-11-29 DIAGNOSIS — Z4931 Encounter for adequacy testing for hemodialysis: Secondary | ICD-10-CM | POA: Diagnosis not present

## 2017-11-29 DIAGNOSIS — Z79899 Other long term (current) drug therapy: Secondary | ICD-10-CM | POA: Diagnosis not present

## 2017-11-29 DIAGNOSIS — D631 Anemia in chronic kidney disease: Secondary | ICD-10-CM | POA: Diagnosis not present

## 2017-11-30 DIAGNOSIS — N186 End stage renal disease: Secondary | ICD-10-CM | POA: Diagnosis not present

## 2017-11-30 DIAGNOSIS — R17 Unspecified jaundice: Secondary | ICD-10-CM | POA: Diagnosis not present

## 2017-11-30 DIAGNOSIS — N2581 Secondary hyperparathyroidism of renal origin: Secondary | ICD-10-CM | POA: Diagnosis not present

## 2017-11-30 DIAGNOSIS — Z4931 Encounter for adequacy testing for hemodialysis: Secondary | ICD-10-CM | POA: Diagnosis not present

## 2017-11-30 DIAGNOSIS — D631 Anemia in chronic kidney disease: Secondary | ICD-10-CM | POA: Diagnosis not present

## 2017-11-30 DIAGNOSIS — Z79899 Other long term (current) drug therapy: Secondary | ICD-10-CM | POA: Diagnosis not present

## 2017-12-02 DIAGNOSIS — R17 Unspecified jaundice: Secondary | ICD-10-CM | POA: Diagnosis not present

## 2017-12-02 DIAGNOSIS — D631 Anemia in chronic kidney disease: Secondary | ICD-10-CM | POA: Diagnosis not present

## 2017-12-02 DIAGNOSIS — Z4931 Encounter for adequacy testing for hemodialysis: Secondary | ICD-10-CM | POA: Diagnosis not present

## 2017-12-02 DIAGNOSIS — N186 End stage renal disease: Secondary | ICD-10-CM | POA: Diagnosis not present

## 2017-12-02 DIAGNOSIS — N2581 Secondary hyperparathyroidism of renal origin: Secondary | ICD-10-CM | POA: Diagnosis not present

## 2017-12-02 DIAGNOSIS — Z79899 Other long term (current) drug therapy: Secondary | ICD-10-CM | POA: Diagnosis not present

## 2017-12-04 DIAGNOSIS — N2581 Secondary hyperparathyroidism of renal origin: Secondary | ICD-10-CM | POA: Diagnosis not present

## 2017-12-04 DIAGNOSIS — Z4931 Encounter for adequacy testing for hemodialysis: Secondary | ICD-10-CM | POA: Diagnosis not present

## 2017-12-04 DIAGNOSIS — Z79899 Other long term (current) drug therapy: Secondary | ICD-10-CM | POA: Diagnosis not present

## 2017-12-04 DIAGNOSIS — D631 Anemia in chronic kidney disease: Secondary | ICD-10-CM | POA: Diagnosis not present

## 2017-12-04 DIAGNOSIS — N186 End stage renal disease: Secondary | ICD-10-CM | POA: Diagnosis not present

## 2017-12-04 DIAGNOSIS — R17 Unspecified jaundice: Secondary | ICD-10-CM | POA: Diagnosis not present

## 2017-12-06 DIAGNOSIS — D631 Anemia in chronic kidney disease: Secondary | ICD-10-CM | POA: Diagnosis not present

## 2017-12-06 DIAGNOSIS — Z79899 Other long term (current) drug therapy: Secondary | ICD-10-CM | POA: Diagnosis not present

## 2017-12-06 DIAGNOSIS — N186 End stage renal disease: Secondary | ICD-10-CM | POA: Diagnosis not present

## 2017-12-06 DIAGNOSIS — Z4931 Encounter for adequacy testing for hemodialysis: Secondary | ICD-10-CM | POA: Diagnosis not present

## 2017-12-06 DIAGNOSIS — N2581 Secondary hyperparathyroidism of renal origin: Secondary | ICD-10-CM | POA: Diagnosis not present

## 2017-12-06 DIAGNOSIS — R17 Unspecified jaundice: Secondary | ICD-10-CM | POA: Diagnosis not present

## 2017-12-07 DIAGNOSIS — R17 Unspecified jaundice: Secondary | ICD-10-CM | POA: Diagnosis not present

## 2017-12-07 DIAGNOSIS — Z4931 Encounter for adequacy testing for hemodialysis: Secondary | ICD-10-CM | POA: Diagnosis not present

## 2017-12-07 DIAGNOSIS — D631 Anemia in chronic kidney disease: Secondary | ICD-10-CM | POA: Diagnosis not present

## 2017-12-07 DIAGNOSIS — N186 End stage renal disease: Secondary | ICD-10-CM | POA: Diagnosis not present

## 2017-12-07 DIAGNOSIS — Z79899 Other long term (current) drug therapy: Secondary | ICD-10-CM | POA: Diagnosis not present

## 2017-12-07 DIAGNOSIS — N2581 Secondary hyperparathyroidism of renal origin: Secondary | ICD-10-CM | POA: Diagnosis not present

## 2017-12-09 DIAGNOSIS — R17 Unspecified jaundice: Secondary | ICD-10-CM | POA: Diagnosis not present

## 2017-12-09 DIAGNOSIS — N186 End stage renal disease: Secondary | ICD-10-CM | POA: Diagnosis not present

## 2017-12-09 DIAGNOSIS — Z4931 Encounter for adequacy testing for hemodialysis: Secondary | ICD-10-CM | POA: Diagnosis not present

## 2017-12-09 DIAGNOSIS — N2581 Secondary hyperparathyroidism of renal origin: Secondary | ICD-10-CM | POA: Diagnosis not present

## 2017-12-09 DIAGNOSIS — Z79899 Other long term (current) drug therapy: Secondary | ICD-10-CM | POA: Diagnosis not present

## 2017-12-09 DIAGNOSIS — D631 Anemia in chronic kidney disease: Secondary | ICD-10-CM | POA: Diagnosis not present

## 2017-12-11 DIAGNOSIS — N186 End stage renal disease: Secondary | ICD-10-CM | POA: Diagnosis not present

## 2017-12-11 DIAGNOSIS — D631 Anemia in chronic kidney disease: Secondary | ICD-10-CM | POA: Diagnosis not present

## 2017-12-11 DIAGNOSIS — N2581 Secondary hyperparathyroidism of renal origin: Secondary | ICD-10-CM | POA: Diagnosis not present

## 2017-12-11 DIAGNOSIS — Z79899 Other long term (current) drug therapy: Secondary | ICD-10-CM | POA: Diagnosis not present

## 2017-12-11 DIAGNOSIS — R17 Unspecified jaundice: Secondary | ICD-10-CM | POA: Diagnosis not present

## 2017-12-11 DIAGNOSIS — Z4931 Encounter for adequacy testing for hemodialysis: Secondary | ICD-10-CM | POA: Diagnosis not present

## 2017-12-13 DIAGNOSIS — N186 End stage renal disease: Secondary | ICD-10-CM | POA: Diagnosis not present

## 2017-12-13 DIAGNOSIS — N2581 Secondary hyperparathyroidism of renal origin: Secondary | ICD-10-CM | POA: Diagnosis not present

## 2017-12-13 DIAGNOSIS — R17 Unspecified jaundice: Secondary | ICD-10-CM | POA: Diagnosis not present

## 2017-12-13 DIAGNOSIS — D631 Anemia in chronic kidney disease: Secondary | ICD-10-CM | POA: Diagnosis not present

## 2017-12-13 DIAGNOSIS — Z4931 Encounter for adequacy testing for hemodialysis: Secondary | ICD-10-CM | POA: Diagnosis not present

## 2017-12-13 DIAGNOSIS — Z79899 Other long term (current) drug therapy: Secondary | ICD-10-CM | POA: Diagnosis not present

## 2017-12-14 DIAGNOSIS — E440 Moderate protein-calorie malnutrition: Secondary | ICD-10-CM | POA: Diagnosis not present

## 2017-12-14 DIAGNOSIS — Z4931 Encounter for adequacy testing for hemodialysis: Secondary | ICD-10-CM | POA: Diagnosis not present

## 2017-12-14 DIAGNOSIS — D631 Anemia in chronic kidney disease: Secondary | ICD-10-CM | POA: Diagnosis not present

## 2017-12-14 DIAGNOSIS — N2581 Secondary hyperparathyroidism of renal origin: Secondary | ICD-10-CM | POA: Diagnosis not present

## 2017-12-14 DIAGNOSIS — E875 Hyperkalemia: Secondary | ICD-10-CM | POA: Diagnosis not present

## 2017-12-14 DIAGNOSIS — Z79899 Other long term (current) drug therapy: Secondary | ICD-10-CM | POA: Diagnosis not present

## 2017-12-14 DIAGNOSIS — D509 Iron deficiency anemia, unspecified: Secondary | ICD-10-CM | POA: Diagnosis not present

## 2017-12-14 DIAGNOSIS — I120 Hypertensive chronic kidney disease with stage 5 chronic kidney disease or end stage renal disease: Secondary | ICD-10-CM | POA: Diagnosis not present

## 2017-12-14 DIAGNOSIS — N186 End stage renal disease: Secondary | ICD-10-CM | POA: Diagnosis not present

## 2017-12-14 DIAGNOSIS — N2589 Other disorders resulting from impaired renal tubular function: Secondary | ICD-10-CM | POA: Diagnosis not present

## 2017-12-14 DIAGNOSIS — D630 Anemia in neoplastic disease: Secondary | ICD-10-CM | POA: Diagnosis not present

## 2017-12-14 DIAGNOSIS — Z23 Encounter for immunization: Secondary | ICD-10-CM | POA: Diagnosis not present

## 2017-12-14 DIAGNOSIS — Z992 Dependence on renal dialysis: Secondary | ICD-10-CM | POA: Diagnosis not present

## 2017-12-14 DIAGNOSIS — R17 Unspecified jaundice: Secondary | ICD-10-CM | POA: Diagnosis not present

## 2017-12-15 DIAGNOSIS — M109 Gout, unspecified: Secondary | ICD-10-CM | POA: Diagnosis not present

## 2017-12-15 DIAGNOSIS — E440 Moderate protein-calorie malnutrition: Secondary | ICD-10-CM | POA: Diagnosis not present

## 2017-12-15 DIAGNOSIS — D509 Iron deficiency anemia, unspecified: Secondary | ICD-10-CM | POA: Diagnosis not present

## 2017-12-15 DIAGNOSIS — Z4931 Encounter for adequacy testing for hemodialysis: Secondary | ICD-10-CM | POA: Diagnosis not present

## 2017-12-15 DIAGNOSIS — Z79899 Other long term (current) drug therapy: Secondary | ICD-10-CM | POA: Diagnosis not present

## 2017-12-15 DIAGNOSIS — D630 Anemia in neoplastic disease: Secondary | ICD-10-CM | POA: Diagnosis not present

## 2017-12-15 DIAGNOSIS — N186 End stage renal disease: Secondary | ICD-10-CM | POA: Diagnosis not present

## 2017-12-16 DIAGNOSIS — D630 Anemia in neoplastic disease: Secondary | ICD-10-CM | POA: Diagnosis not present

## 2017-12-16 DIAGNOSIS — E440 Moderate protein-calorie malnutrition: Secondary | ICD-10-CM | POA: Diagnosis not present

## 2017-12-16 DIAGNOSIS — Z79899 Other long term (current) drug therapy: Secondary | ICD-10-CM | POA: Diagnosis not present

## 2017-12-16 DIAGNOSIS — N186 End stage renal disease: Secondary | ICD-10-CM | POA: Diagnosis not present

## 2017-12-16 DIAGNOSIS — D509 Iron deficiency anemia, unspecified: Secondary | ICD-10-CM | POA: Diagnosis not present

## 2017-12-16 DIAGNOSIS — Z4931 Encounter for adequacy testing for hemodialysis: Secondary | ICD-10-CM | POA: Diagnosis not present

## 2017-12-17 DIAGNOSIS — C7A8 Other malignant neuroendocrine tumors: Secondary | ICD-10-CM | POA: Diagnosis not present

## 2017-12-17 DIAGNOSIS — J069 Acute upper respiratory infection, unspecified: Secondary | ICD-10-CM | POA: Diagnosis not present

## 2017-12-17 DIAGNOSIS — M109 Gout, unspecified: Secondary | ICD-10-CM | POA: Diagnosis not present

## 2017-12-17 DIAGNOSIS — Z6824 Body mass index (BMI) 24.0-24.9, adult: Secondary | ICD-10-CM | POA: Diagnosis not present

## 2017-12-17 DIAGNOSIS — I10 Essential (primary) hypertension: Secondary | ICD-10-CM | POA: Diagnosis not present

## 2017-12-17 DIAGNOSIS — E038 Other specified hypothyroidism: Secondary | ICD-10-CM | POA: Diagnosis not present

## 2017-12-17 DIAGNOSIS — N186 End stage renal disease: Secondary | ICD-10-CM | POA: Diagnosis not present

## 2017-12-17 DIAGNOSIS — E1122 Type 2 diabetes mellitus with diabetic chronic kidney disease: Secondary | ICD-10-CM | POA: Diagnosis not present

## 2017-12-18 DIAGNOSIS — Z4931 Encounter for adequacy testing for hemodialysis: Secondary | ICD-10-CM | POA: Diagnosis not present

## 2017-12-18 DIAGNOSIS — Z79899 Other long term (current) drug therapy: Secondary | ICD-10-CM | POA: Diagnosis not present

## 2017-12-18 DIAGNOSIS — E440 Moderate protein-calorie malnutrition: Secondary | ICD-10-CM | POA: Diagnosis not present

## 2017-12-18 DIAGNOSIS — D509 Iron deficiency anemia, unspecified: Secondary | ICD-10-CM | POA: Diagnosis not present

## 2017-12-18 DIAGNOSIS — N186 End stage renal disease: Secondary | ICD-10-CM | POA: Diagnosis not present

## 2017-12-18 DIAGNOSIS — D630 Anemia in neoplastic disease: Secondary | ICD-10-CM | POA: Diagnosis not present

## 2017-12-19 DIAGNOSIS — C7A8 Other malignant neuroendocrine tumors: Secondary | ICD-10-CM | POA: Diagnosis not present

## 2017-12-20 DIAGNOSIS — Z79899 Other long term (current) drug therapy: Secondary | ICD-10-CM | POA: Diagnosis not present

## 2017-12-20 DIAGNOSIS — D630 Anemia in neoplastic disease: Secondary | ICD-10-CM | POA: Diagnosis not present

## 2017-12-20 DIAGNOSIS — D509 Iron deficiency anemia, unspecified: Secondary | ICD-10-CM | POA: Diagnosis not present

## 2017-12-20 DIAGNOSIS — Z4931 Encounter for adequacy testing for hemodialysis: Secondary | ICD-10-CM | POA: Diagnosis not present

## 2017-12-20 DIAGNOSIS — E440 Moderate protein-calorie malnutrition: Secondary | ICD-10-CM | POA: Diagnosis not present

## 2017-12-20 DIAGNOSIS — N186 End stage renal disease: Secondary | ICD-10-CM | POA: Diagnosis not present

## 2017-12-21 DIAGNOSIS — D630 Anemia in neoplastic disease: Secondary | ICD-10-CM | POA: Diagnosis not present

## 2017-12-21 DIAGNOSIS — D509 Iron deficiency anemia, unspecified: Secondary | ICD-10-CM | POA: Diagnosis not present

## 2017-12-21 DIAGNOSIS — Z79899 Other long term (current) drug therapy: Secondary | ICD-10-CM | POA: Diagnosis not present

## 2017-12-21 DIAGNOSIS — Z4931 Encounter for adequacy testing for hemodialysis: Secondary | ICD-10-CM | POA: Diagnosis not present

## 2017-12-21 DIAGNOSIS — E440 Moderate protein-calorie malnutrition: Secondary | ICD-10-CM | POA: Diagnosis not present

## 2017-12-21 DIAGNOSIS — N186 End stage renal disease: Secondary | ICD-10-CM | POA: Diagnosis not present

## 2017-12-23 DIAGNOSIS — Z4931 Encounter for adequacy testing for hemodialysis: Secondary | ICD-10-CM | POA: Diagnosis not present

## 2017-12-23 DIAGNOSIS — E440 Moderate protein-calorie malnutrition: Secondary | ICD-10-CM | POA: Diagnosis not present

## 2017-12-23 DIAGNOSIS — N186 End stage renal disease: Secondary | ICD-10-CM | POA: Diagnosis not present

## 2017-12-23 DIAGNOSIS — D509 Iron deficiency anemia, unspecified: Secondary | ICD-10-CM | POA: Diagnosis not present

## 2017-12-23 DIAGNOSIS — Z79899 Other long term (current) drug therapy: Secondary | ICD-10-CM | POA: Diagnosis not present

## 2017-12-23 DIAGNOSIS — D630 Anemia in neoplastic disease: Secondary | ICD-10-CM | POA: Diagnosis not present

## 2017-12-24 DIAGNOSIS — D630 Anemia in neoplastic disease: Secondary | ICD-10-CM | POA: Diagnosis not present

## 2017-12-24 DIAGNOSIS — D509 Iron deficiency anemia, unspecified: Secondary | ICD-10-CM | POA: Diagnosis not present

## 2017-12-24 DIAGNOSIS — N186 End stage renal disease: Secondary | ICD-10-CM | POA: Diagnosis not present

## 2017-12-24 DIAGNOSIS — E440 Moderate protein-calorie malnutrition: Secondary | ICD-10-CM | POA: Diagnosis not present

## 2017-12-24 DIAGNOSIS — Z4931 Encounter for adequacy testing for hemodialysis: Secondary | ICD-10-CM | POA: Diagnosis not present

## 2017-12-24 DIAGNOSIS — Z79899 Other long term (current) drug therapy: Secondary | ICD-10-CM | POA: Diagnosis not present

## 2017-12-25 DIAGNOSIS — E440 Moderate protein-calorie malnutrition: Secondary | ICD-10-CM | POA: Diagnosis not present

## 2017-12-25 DIAGNOSIS — Z4931 Encounter for adequacy testing for hemodialysis: Secondary | ICD-10-CM | POA: Diagnosis not present

## 2017-12-25 DIAGNOSIS — D509 Iron deficiency anemia, unspecified: Secondary | ICD-10-CM | POA: Diagnosis not present

## 2017-12-25 DIAGNOSIS — N186 End stage renal disease: Secondary | ICD-10-CM | POA: Diagnosis not present

## 2017-12-25 DIAGNOSIS — Z79899 Other long term (current) drug therapy: Secondary | ICD-10-CM | POA: Diagnosis not present

## 2017-12-25 DIAGNOSIS — D630 Anemia in neoplastic disease: Secondary | ICD-10-CM | POA: Diagnosis not present

## 2017-12-27 DIAGNOSIS — E440 Moderate protein-calorie malnutrition: Secondary | ICD-10-CM | POA: Diagnosis not present

## 2017-12-27 DIAGNOSIS — D630 Anemia in neoplastic disease: Secondary | ICD-10-CM | POA: Diagnosis not present

## 2017-12-27 DIAGNOSIS — Z4931 Encounter for adequacy testing for hemodialysis: Secondary | ICD-10-CM | POA: Diagnosis not present

## 2017-12-27 DIAGNOSIS — D509 Iron deficiency anemia, unspecified: Secondary | ICD-10-CM | POA: Diagnosis not present

## 2017-12-27 DIAGNOSIS — N186 End stage renal disease: Secondary | ICD-10-CM | POA: Diagnosis not present

## 2017-12-27 DIAGNOSIS — Z79899 Other long term (current) drug therapy: Secondary | ICD-10-CM | POA: Diagnosis not present

## 2017-12-28 DIAGNOSIS — D509 Iron deficiency anemia, unspecified: Secondary | ICD-10-CM | POA: Diagnosis not present

## 2017-12-28 DIAGNOSIS — D630 Anemia in neoplastic disease: Secondary | ICD-10-CM | POA: Diagnosis not present

## 2017-12-28 DIAGNOSIS — Z4931 Encounter for adequacy testing for hemodialysis: Secondary | ICD-10-CM | POA: Diagnosis not present

## 2017-12-28 DIAGNOSIS — Z79899 Other long term (current) drug therapy: Secondary | ICD-10-CM | POA: Diagnosis not present

## 2017-12-28 DIAGNOSIS — N186 End stage renal disease: Secondary | ICD-10-CM | POA: Diagnosis not present

## 2017-12-28 DIAGNOSIS — E440 Moderate protein-calorie malnutrition: Secondary | ICD-10-CM | POA: Diagnosis not present

## 2017-12-29 DIAGNOSIS — H2513 Age-related nuclear cataract, bilateral: Secondary | ICD-10-CM | POA: Diagnosis not present

## 2017-12-29 DIAGNOSIS — H40013 Open angle with borderline findings, low risk, bilateral: Secondary | ICD-10-CM | POA: Diagnosis not present

## 2017-12-30 DIAGNOSIS — D630 Anemia in neoplastic disease: Secondary | ICD-10-CM | POA: Diagnosis not present

## 2017-12-30 DIAGNOSIS — Z4931 Encounter for adequacy testing for hemodialysis: Secondary | ICD-10-CM | POA: Diagnosis not present

## 2017-12-30 DIAGNOSIS — Z79899 Other long term (current) drug therapy: Secondary | ICD-10-CM | POA: Diagnosis not present

## 2017-12-30 DIAGNOSIS — N186 End stage renal disease: Secondary | ICD-10-CM | POA: Diagnosis not present

## 2017-12-30 DIAGNOSIS — E440 Moderate protein-calorie malnutrition: Secondary | ICD-10-CM | POA: Diagnosis not present

## 2017-12-30 DIAGNOSIS — D509 Iron deficiency anemia, unspecified: Secondary | ICD-10-CM | POA: Diagnosis not present

## 2018-01-01 DIAGNOSIS — Z4931 Encounter for adequacy testing for hemodialysis: Secondary | ICD-10-CM | POA: Diagnosis not present

## 2018-01-01 DIAGNOSIS — D509 Iron deficiency anemia, unspecified: Secondary | ICD-10-CM | POA: Diagnosis not present

## 2018-01-01 DIAGNOSIS — E440 Moderate protein-calorie malnutrition: Secondary | ICD-10-CM | POA: Diagnosis not present

## 2018-01-01 DIAGNOSIS — N186 End stage renal disease: Secondary | ICD-10-CM | POA: Diagnosis not present

## 2018-01-01 DIAGNOSIS — Z79899 Other long term (current) drug therapy: Secondary | ICD-10-CM | POA: Diagnosis not present

## 2018-01-01 DIAGNOSIS — D630 Anemia in neoplastic disease: Secondary | ICD-10-CM | POA: Diagnosis not present

## 2018-01-03 DIAGNOSIS — N186 End stage renal disease: Secondary | ICD-10-CM | POA: Diagnosis not present

## 2018-01-03 DIAGNOSIS — Z4931 Encounter for adequacy testing for hemodialysis: Secondary | ICD-10-CM | POA: Diagnosis not present

## 2018-01-03 DIAGNOSIS — E440 Moderate protein-calorie malnutrition: Secondary | ICD-10-CM | POA: Diagnosis not present

## 2018-01-03 DIAGNOSIS — Z79899 Other long term (current) drug therapy: Secondary | ICD-10-CM | POA: Diagnosis not present

## 2018-01-03 DIAGNOSIS — D630 Anemia in neoplastic disease: Secondary | ICD-10-CM | POA: Diagnosis not present

## 2018-01-03 DIAGNOSIS — D509 Iron deficiency anemia, unspecified: Secondary | ICD-10-CM | POA: Diagnosis not present

## 2018-01-04 DIAGNOSIS — D630 Anemia in neoplastic disease: Secondary | ICD-10-CM | POA: Diagnosis not present

## 2018-01-04 DIAGNOSIS — E440 Moderate protein-calorie malnutrition: Secondary | ICD-10-CM | POA: Diagnosis not present

## 2018-01-04 DIAGNOSIS — Z4931 Encounter for adequacy testing for hemodialysis: Secondary | ICD-10-CM | POA: Diagnosis not present

## 2018-01-04 DIAGNOSIS — N186 End stage renal disease: Secondary | ICD-10-CM | POA: Diagnosis not present

## 2018-01-04 DIAGNOSIS — D509 Iron deficiency anemia, unspecified: Secondary | ICD-10-CM | POA: Diagnosis not present

## 2018-01-04 DIAGNOSIS — Z79899 Other long term (current) drug therapy: Secondary | ICD-10-CM | POA: Diagnosis not present

## 2018-01-06 DIAGNOSIS — Z79899 Other long term (current) drug therapy: Secondary | ICD-10-CM | POA: Diagnosis not present

## 2018-01-06 DIAGNOSIS — E440 Moderate protein-calorie malnutrition: Secondary | ICD-10-CM | POA: Diagnosis not present

## 2018-01-06 DIAGNOSIS — Z4931 Encounter for adequacy testing for hemodialysis: Secondary | ICD-10-CM | POA: Diagnosis not present

## 2018-01-06 DIAGNOSIS — D630 Anemia in neoplastic disease: Secondary | ICD-10-CM | POA: Diagnosis not present

## 2018-01-06 DIAGNOSIS — N186 End stage renal disease: Secondary | ICD-10-CM | POA: Diagnosis not present

## 2018-01-06 DIAGNOSIS — D509 Iron deficiency anemia, unspecified: Secondary | ICD-10-CM | POA: Diagnosis not present

## 2018-01-08 DIAGNOSIS — N186 End stage renal disease: Secondary | ICD-10-CM | POA: Diagnosis not present

## 2018-01-08 DIAGNOSIS — Z79899 Other long term (current) drug therapy: Secondary | ICD-10-CM | POA: Diagnosis not present

## 2018-01-08 DIAGNOSIS — D509 Iron deficiency anemia, unspecified: Secondary | ICD-10-CM | POA: Diagnosis not present

## 2018-01-08 DIAGNOSIS — D630 Anemia in neoplastic disease: Secondary | ICD-10-CM | POA: Diagnosis not present

## 2018-01-08 DIAGNOSIS — E440 Moderate protein-calorie malnutrition: Secondary | ICD-10-CM | POA: Diagnosis not present

## 2018-01-08 DIAGNOSIS — Z4931 Encounter for adequacy testing for hemodialysis: Secondary | ICD-10-CM | POA: Diagnosis not present

## 2018-01-10 DIAGNOSIS — Z4931 Encounter for adequacy testing for hemodialysis: Secondary | ICD-10-CM | POA: Diagnosis not present

## 2018-01-10 DIAGNOSIS — D630 Anemia in neoplastic disease: Secondary | ICD-10-CM | POA: Diagnosis not present

## 2018-01-10 DIAGNOSIS — Z79899 Other long term (current) drug therapy: Secondary | ICD-10-CM | POA: Diagnosis not present

## 2018-01-10 DIAGNOSIS — E440 Moderate protein-calorie malnutrition: Secondary | ICD-10-CM | POA: Diagnosis not present

## 2018-01-10 DIAGNOSIS — D509 Iron deficiency anemia, unspecified: Secondary | ICD-10-CM | POA: Diagnosis not present

## 2018-01-10 DIAGNOSIS — N186 End stage renal disease: Secondary | ICD-10-CM | POA: Diagnosis not present

## 2018-01-11 DIAGNOSIS — D509 Iron deficiency anemia, unspecified: Secondary | ICD-10-CM | POA: Diagnosis not present

## 2018-01-11 DIAGNOSIS — E440 Moderate protein-calorie malnutrition: Secondary | ICD-10-CM | POA: Diagnosis not present

## 2018-01-11 DIAGNOSIS — Z79899 Other long term (current) drug therapy: Secondary | ICD-10-CM | POA: Diagnosis not present

## 2018-01-11 DIAGNOSIS — Z4931 Encounter for adequacy testing for hemodialysis: Secondary | ICD-10-CM | POA: Diagnosis not present

## 2018-01-11 DIAGNOSIS — N186 End stage renal disease: Secondary | ICD-10-CM | POA: Diagnosis not present

## 2018-01-11 DIAGNOSIS — D630 Anemia in neoplastic disease: Secondary | ICD-10-CM | POA: Diagnosis not present

## 2018-01-13 DIAGNOSIS — D509 Iron deficiency anemia, unspecified: Secondary | ICD-10-CM | POA: Diagnosis not present

## 2018-01-13 DIAGNOSIS — N186 End stage renal disease: Secondary | ICD-10-CM | POA: Diagnosis not present

## 2018-01-13 DIAGNOSIS — Z79899 Other long term (current) drug therapy: Secondary | ICD-10-CM | POA: Diagnosis not present

## 2018-01-13 DIAGNOSIS — Z4931 Encounter for adequacy testing for hemodialysis: Secondary | ICD-10-CM | POA: Diagnosis not present

## 2018-01-13 DIAGNOSIS — D630 Anemia in neoplastic disease: Secondary | ICD-10-CM | POA: Diagnosis not present

## 2018-01-13 DIAGNOSIS — E440 Moderate protein-calorie malnutrition: Secondary | ICD-10-CM | POA: Diagnosis not present

## 2018-01-14 DIAGNOSIS — N186 End stage renal disease: Secondary | ICD-10-CM | POA: Diagnosis not present

## 2018-01-14 DIAGNOSIS — Z992 Dependence on renal dialysis: Secondary | ICD-10-CM | POA: Diagnosis not present

## 2018-01-14 DIAGNOSIS — N2589 Other disorders resulting from impaired renal tubular function: Secondary | ICD-10-CM | POA: Diagnosis not present

## 2018-01-14 HISTORY — PX: KIDNEY TRANSPLANT: SHX239

## 2018-01-15 DIAGNOSIS — E875 Hyperkalemia: Secondary | ICD-10-CM | POA: Diagnosis not present

## 2018-01-15 DIAGNOSIS — K7689 Other specified diseases of liver: Secondary | ICD-10-CM | POA: Diagnosis not present

## 2018-01-15 DIAGNOSIS — D509 Iron deficiency anemia, unspecified: Secondary | ICD-10-CM | POA: Diagnosis not present

## 2018-01-15 DIAGNOSIS — H25012 Cortical age-related cataract, left eye: Secondary | ICD-10-CM | POA: Diagnosis not present

## 2018-01-15 DIAGNOSIS — D630 Anemia in neoplastic disease: Secondary | ICD-10-CM | POA: Diagnosis not present

## 2018-01-15 DIAGNOSIS — H2512 Age-related nuclear cataract, left eye: Secondary | ICD-10-CM | POA: Diagnosis not present

## 2018-01-15 DIAGNOSIS — Z4931 Encounter for adequacy testing for hemodialysis: Secondary | ICD-10-CM | POA: Diagnosis not present

## 2018-01-15 DIAGNOSIS — I120 Hypertensive chronic kidney disease with stage 5 chronic kidney disease or end stage renal disease: Secondary | ICD-10-CM | POA: Diagnosis not present

## 2018-01-15 DIAGNOSIS — N2589 Other disorders resulting from impaired renal tubular function: Secondary | ICD-10-CM | POA: Diagnosis not present

## 2018-01-15 DIAGNOSIS — N2581 Secondary hyperparathyroidism of renal origin: Secondary | ICD-10-CM | POA: Diagnosis not present

## 2018-01-15 DIAGNOSIS — N186 End stage renal disease: Secondary | ICD-10-CM | POA: Diagnosis not present

## 2018-01-15 DIAGNOSIS — D513 Other dietary vitamin B12 deficiency anemia: Secondary | ICD-10-CM | POA: Diagnosis not present

## 2018-01-15 DIAGNOSIS — D631 Anemia in chronic kidney disease: Secondary | ICD-10-CM | POA: Diagnosis not present

## 2018-01-15 DIAGNOSIS — Z992 Dependence on renal dialysis: Secondary | ICD-10-CM | POA: Diagnosis not present

## 2018-01-16 DIAGNOSIS — H2511 Age-related nuclear cataract, right eye: Secondary | ICD-10-CM | POA: Diagnosis not present

## 2018-01-17 DIAGNOSIS — Z4931 Encounter for adequacy testing for hemodialysis: Secondary | ICD-10-CM | POA: Diagnosis not present

## 2018-01-17 DIAGNOSIS — N2589 Other disorders resulting from impaired renal tubular function: Secondary | ICD-10-CM | POA: Diagnosis not present

## 2018-01-17 DIAGNOSIS — N2581 Secondary hyperparathyroidism of renal origin: Secondary | ICD-10-CM | POA: Diagnosis not present

## 2018-01-17 DIAGNOSIS — D509 Iron deficiency anemia, unspecified: Secondary | ICD-10-CM | POA: Diagnosis not present

## 2018-01-17 DIAGNOSIS — K7689 Other specified diseases of liver: Secondary | ICD-10-CM | POA: Diagnosis not present

## 2018-01-17 DIAGNOSIS — N186 End stage renal disease: Secondary | ICD-10-CM | POA: Diagnosis not present

## 2018-01-18 DIAGNOSIS — K7689 Other specified diseases of liver: Secondary | ICD-10-CM | POA: Diagnosis not present

## 2018-01-18 DIAGNOSIS — N186 End stage renal disease: Secondary | ICD-10-CM | POA: Diagnosis not present

## 2018-01-18 DIAGNOSIS — Z4931 Encounter for adequacy testing for hemodialysis: Secondary | ICD-10-CM | POA: Diagnosis not present

## 2018-01-18 DIAGNOSIS — D509 Iron deficiency anemia, unspecified: Secondary | ICD-10-CM | POA: Diagnosis not present

## 2018-01-18 DIAGNOSIS — N2589 Other disorders resulting from impaired renal tubular function: Secondary | ICD-10-CM | POA: Diagnosis not present

## 2018-01-18 DIAGNOSIS — N2581 Secondary hyperparathyroidism of renal origin: Secondary | ICD-10-CM | POA: Diagnosis not present

## 2018-01-19 DIAGNOSIS — E1129 Type 2 diabetes mellitus with other diabetic kidney complication: Secondary | ICD-10-CM | POA: Diagnosis not present

## 2018-01-19 DIAGNOSIS — M109 Gout, unspecified: Secondary | ICD-10-CM | POA: Diagnosis not present

## 2018-01-19 DIAGNOSIS — N2581 Secondary hyperparathyroidism of renal origin: Secondary | ICD-10-CM | POA: Diagnosis not present

## 2018-01-19 DIAGNOSIS — Z4931 Encounter for adequacy testing for hemodialysis: Secondary | ICD-10-CM | POA: Diagnosis not present

## 2018-01-19 DIAGNOSIS — D509 Iron deficiency anemia, unspecified: Secondary | ICD-10-CM | POA: Diagnosis not present

## 2018-01-19 DIAGNOSIS — N186 End stage renal disease: Secondary | ICD-10-CM | POA: Diagnosis not present

## 2018-01-19 DIAGNOSIS — E7841 Elevated Lipoprotein(a): Secondary | ICD-10-CM | POA: Diagnosis not present

## 2018-01-19 DIAGNOSIS — N2589 Other disorders resulting from impaired renal tubular function: Secondary | ICD-10-CM | POA: Diagnosis not present

## 2018-01-19 DIAGNOSIS — E039 Hypothyroidism, unspecified: Secondary | ICD-10-CM | POA: Diagnosis not present

## 2018-01-19 DIAGNOSIS — K7689 Other specified diseases of liver: Secondary | ICD-10-CM | POA: Diagnosis not present

## 2018-01-20 DIAGNOSIS — Z7682 Awaiting organ transplant status: Secondary | ICD-10-CM | POA: Diagnosis not present

## 2018-01-20 DIAGNOSIS — D509 Iron deficiency anemia, unspecified: Secondary | ICD-10-CM | POA: Diagnosis not present

## 2018-01-20 DIAGNOSIS — K7689 Other specified diseases of liver: Secondary | ICD-10-CM | POA: Diagnosis not present

## 2018-01-20 DIAGNOSIS — N2589 Other disorders resulting from impaired renal tubular function: Secondary | ICD-10-CM | POA: Diagnosis not present

## 2018-01-20 DIAGNOSIS — Z005 Encounter for examination of potential donor of organ and tissue: Secondary | ICD-10-CM | POA: Diagnosis not present

## 2018-01-20 DIAGNOSIS — N2581 Secondary hyperparathyroidism of renal origin: Secondary | ICD-10-CM | POA: Diagnosis not present

## 2018-01-20 DIAGNOSIS — N186 End stage renal disease: Secondary | ICD-10-CM | POA: Diagnosis not present

## 2018-01-20 DIAGNOSIS — Z4931 Encounter for adequacy testing for hemodialysis: Secondary | ICD-10-CM | POA: Diagnosis not present

## 2018-01-22 DIAGNOSIS — D509 Iron deficiency anemia, unspecified: Secondary | ICD-10-CM | POA: Diagnosis not present

## 2018-01-22 DIAGNOSIS — N186 End stage renal disease: Secondary | ICD-10-CM | POA: Diagnosis not present

## 2018-01-22 DIAGNOSIS — N2581 Secondary hyperparathyroidism of renal origin: Secondary | ICD-10-CM | POA: Diagnosis not present

## 2018-01-22 DIAGNOSIS — Z4931 Encounter for adequacy testing for hemodialysis: Secondary | ICD-10-CM | POA: Diagnosis not present

## 2018-01-22 DIAGNOSIS — N2589 Other disorders resulting from impaired renal tubular function: Secondary | ICD-10-CM | POA: Diagnosis not present

## 2018-01-22 DIAGNOSIS — K7689 Other specified diseases of liver: Secondary | ICD-10-CM | POA: Diagnosis not present

## 2018-01-24 DIAGNOSIS — N2581 Secondary hyperparathyroidism of renal origin: Secondary | ICD-10-CM | POA: Diagnosis not present

## 2018-01-24 DIAGNOSIS — Z4931 Encounter for adequacy testing for hemodialysis: Secondary | ICD-10-CM | POA: Diagnosis not present

## 2018-01-24 DIAGNOSIS — D509 Iron deficiency anemia, unspecified: Secondary | ICD-10-CM | POA: Diagnosis not present

## 2018-01-24 DIAGNOSIS — N2589 Other disorders resulting from impaired renal tubular function: Secondary | ICD-10-CM | POA: Diagnosis not present

## 2018-01-24 DIAGNOSIS — K7689 Other specified diseases of liver: Secondary | ICD-10-CM | POA: Diagnosis not present

## 2018-01-24 DIAGNOSIS — N186 End stage renal disease: Secondary | ICD-10-CM | POA: Diagnosis not present

## 2018-01-25 DIAGNOSIS — K7689 Other specified diseases of liver: Secondary | ICD-10-CM | POA: Diagnosis not present

## 2018-01-25 DIAGNOSIS — N2581 Secondary hyperparathyroidism of renal origin: Secondary | ICD-10-CM | POA: Diagnosis not present

## 2018-01-25 DIAGNOSIS — N2589 Other disorders resulting from impaired renal tubular function: Secondary | ICD-10-CM | POA: Diagnosis not present

## 2018-01-25 DIAGNOSIS — Z4931 Encounter for adequacy testing for hemodialysis: Secondary | ICD-10-CM | POA: Diagnosis not present

## 2018-01-25 DIAGNOSIS — N186 End stage renal disease: Secondary | ICD-10-CM | POA: Diagnosis not present

## 2018-01-25 DIAGNOSIS — D509 Iron deficiency anemia, unspecified: Secondary | ICD-10-CM | POA: Diagnosis not present

## 2018-01-26 DIAGNOSIS — K7689 Other specified diseases of liver: Secondary | ICD-10-CM | POA: Diagnosis not present

## 2018-01-26 DIAGNOSIS — N2589 Other disorders resulting from impaired renal tubular function: Secondary | ICD-10-CM | POA: Diagnosis not present

## 2018-01-26 DIAGNOSIS — D509 Iron deficiency anemia, unspecified: Secondary | ICD-10-CM | POA: Diagnosis not present

## 2018-01-26 DIAGNOSIS — N2581 Secondary hyperparathyroidism of renal origin: Secondary | ICD-10-CM | POA: Diagnosis not present

## 2018-01-26 DIAGNOSIS — Z4931 Encounter for adequacy testing for hemodialysis: Secondary | ICD-10-CM | POA: Diagnosis not present

## 2018-01-26 DIAGNOSIS — N186 End stage renal disease: Secondary | ICD-10-CM | POA: Diagnosis not present

## 2018-01-27 DIAGNOSIS — D509 Iron deficiency anemia, unspecified: Secondary | ICD-10-CM | POA: Diagnosis not present

## 2018-01-27 DIAGNOSIS — Z4931 Encounter for adequacy testing for hemodialysis: Secondary | ICD-10-CM | POA: Diagnosis not present

## 2018-01-27 DIAGNOSIS — K7689 Other specified diseases of liver: Secondary | ICD-10-CM | POA: Diagnosis not present

## 2018-01-27 DIAGNOSIS — N186 End stage renal disease: Secondary | ICD-10-CM | POA: Diagnosis not present

## 2018-01-27 DIAGNOSIS — N2581 Secondary hyperparathyroidism of renal origin: Secondary | ICD-10-CM | POA: Diagnosis not present

## 2018-01-27 DIAGNOSIS — N2589 Other disorders resulting from impaired renal tubular function: Secondary | ICD-10-CM | POA: Diagnosis not present

## 2018-01-29 DIAGNOSIS — N2589 Other disorders resulting from impaired renal tubular function: Secondary | ICD-10-CM | POA: Diagnosis not present

## 2018-01-29 DIAGNOSIS — Z7682 Awaiting organ transplant status: Secondary | ICD-10-CM | POA: Diagnosis not present

## 2018-01-29 DIAGNOSIS — N186 End stage renal disease: Secondary | ICD-10-CM | POA: Diagnosis not present

## 2018-01-29 DIAGNOSIS — N2581 Secondary hyperparathyroidism of renal origin: Secondary | ICD-10-CM | POA: Diagnosis not present

## 2018-01-29 DIAGNOSIS — Z4931 Encounter for adequacy testing for hemodialysis: Secondary | ICD-10-CM | POA: Diagnosis not present

## 2018-01-29 DIAGNOSIS — D509 Iron deficiency anemia, unspecified: Secondary | ICD-10-CM | POA: Diagnosis not present

## 2018-01-29 DIAGNOSIS — K7689 Other specified diseases of liver: Secondary | ICD-10-CM | POA: Diagnosis not present

## 2018-01-31 DIAGNOSIS — K7689 Other specified diseases of liver: Secondary | ICD-10-CM | POA: Diagnosis not present

## 2018-01-31 DIAGNOSIS — D509 Iron deficiency anemia, unspecified: Secondary | ICD-10-CM | POA: Diagnosis not present

## 2018-01-31 DIAGNOSIS — N2589 Other disorders resulting from impaired renal tubular function: Secondary | ICD-10-CM | POA: Diagnosis not present

## 2018-01-31 DIAGNOSIS — Z4931 Encounter for adequacy testing for hemodialysis: Secondary | ICD-10-CM | POA: Diagnosis not present

## 2018-01-31 DIAGNOSIS — N2581 Secondary hyperparathyroidism of renal origin: Secondary | ICD-10-CM | POA: Diagnosis not present

## 2018-01-31 DIAGNOSIS — N186 End stage renal disease: Secondary | ICD-10-CM | POA: Diagnosis not present

## 2018-02-01 DIAGNOSIS — N2581 Secondary hyperparathyroidism of renal origin: Secondary | ICD-10-CM | POA: Diagnosis not present

## 2018-02-01 DIAGNOSIS — Z4931 Encounter for adequacy testing for hemodialysis: Secondary | ICD-10-CM | POA: Diagnosis not present

## 2018-02-01 DIAGNOSIS — N2589 Other disorders resulting from impaired renal tubular function: Secondary | ICD-10-CM | POA: Diagnosis not present

## 2018-02-01 DIAGNOSIS — N186 End stage renal disease: Secondary | ICD-10-CM | POA: Diagnosis not present

## 2018-02-01 DIAGNOSIS — K7689 Other specified diseases of liver: Secondary | ICD-10-CM | POA: Diagnosis not present

## 2018-02-01 DIAGNOSIS — D509 Iron deficiency anemia, unspecified: Secondary | ICD-10-CM | POA: Diagnosis not present

## 2018-02-03 DIAGNOSIS — N186 End stage renal disease: Secondary | ICD-10-CM | POA: Diagnosis not present

## 2018-02-03 DIAGNOSIS — K7689 Other specified diseases of liver: Secondary | ICD-10-CM | POA: Diagnosis not present

## 2018-02-03 DIAGNOSIS — D509 Iron deficiency anemia, unspecified: Secondary | ICD-10-CM | POA: Diagnosis not present

## 2018-02-03 DIAGNOSIS — Z4931 Encounter for adequacy testing for hemodialysis: Secondary | ICD-10-CM | POA: Diagnosis not present

## 2018-02-03 DIAGNOSIS — N2581 Secondary hyperparathyroidism of renal origin: Secondary | ICD-10-CM | POA: Diagnosis not present

## 2018-02-03 DIAGNOSIS — N2589 Other disorders resulting from impaired renal tubular function: Secondary | ICD-10-CM | POA: Diagnosis not present

## 2018-02-04 DIAGNOSIS — E039 Hypothyroidism, unspecified: Secondary | ICD-10-CM | POA: Diagnosis not present

## 2018-02-04 DIAGNOSIS — Z7682 Awaiting organ transplant status: Secondary | ICD-10-CM | POA: Diagnosis not present

## 2018-02-04 DIAGNOSIS — Z792 Long term (current) use of antibiotics: Secondary | ICD-10-CM | POA: Diagnosis not present

## 2018-02-04 DIAGNOSIS — N186 End stage renal disease: Secondary | ICD-10-CM | POA: Diagnosis not present

## 2018-02-04 DIAGNOSIS — Z960 Presence of urogenital implants: Secondary | ICD-10-CM | POA: Diagnosis not present

## 2018-02-04 DIAGNOSIS — Z298 Encounter for other specified prophylactic measures: Secondary | ICD-10-CM | POA: Diagnosis not present

## 2018-02-04 DIAGNOSIS — K5900 Constipation, unspecified: Secondary | ICD-10-CM | POA: Diagnosis present

## 2018-02-04 DIAGNOSIS — Z79899 Other long term (current) drug therapy: Secondary | ICD-10-CM | POA: Diagnosis not present

## 2018-02-04 DIAGNOSIS — M109 Gout, unspecified: Secondary | ICD-10-CM | POA: Diagnosis present

## 2018-02-04 DIAGNOSIS — N028 Recurrent and persistent hematuria with other morphologic changes: Secondary | ICD-10-CM | POA: Diagnosis not present

## 2018-02-04 DIAGNOSIS — Z882 Allergy status to sulfonamides status: Secondary | ICD-10-CM | POA: Diagnosis not present

## 2018-02-04 DIAGNOSIS — I120 Hypertensive chronic kidney disease with stage 5 chronic kidney disease or end stage renal disease: Secondary | ICD-10-CM | POA: Diagnosis not present

## 2018-02-04 DIAGNOSIS — E1165 Type 2 diabetes mellitus with hyperglycemia: Secondary | ICD-10-CM | POA: Diagnosis not present

## 2018-02-04 DIAGNOSIS — R7303 Prediabetes: Secondary | ICD-10-CM | POA: Diagnosis not present

## 2018-02-04 DIAGNOSIS — E0965 Drug or chemical induced diabetes mellitus with hyperglycemia: Secondary | ICD-10-CM | POA: Diagnosis not present

## 2018-02-04 DIAGNOSIS — D899 Disorder involving the immune mechanism, unspecified: Secondary | ICD-10-CM | POA: Diagnosis not present

## 2018-02-04 DIAGNOSIS — E118 Type 2 diabetes mellitus with unspecified complications: Secondary | ICD-10-CM | POA: Diagnosis not present

## 2018-02-04 DIAGNOSIS — E119 Type 2 diabetes mellitus without complications: Secondary | ICD-10-CM | POA: Diagnosis present

## 2018-02-04 DIAGNOSIS — T380X5A Adverse effect of glucocorticoids and synthetic analogues, initial encounter: Secondary | ICD-10-CM | POA: Diagnosis not present

## 2018-02-04 DIAGNOSIS — R7881 Bacteremia: Secondary | ICD-10-CM | POA: Diagnosis not present

## 2018-02-04 DIAGNOSIS — R140 Abdominal distension (gaseous): Secondary | ICD-10-CM | POA: Diagnosis not present

## 2018-02-04 DIAGNOSIS — I10 Essential (primary) hypertension: Secondary | ICD-10-CM | POA: Diagnosis not present

## 2018-02-04 DIAGNOSIS — Z992 Dependence on renal dialysis: Secondary | ICD-10-CM | POA: Diagnosis not present

## 2018-02-04 DIAGNOSIS — Z940 Kidney transplant status: Secondary | ICD-10-CM | POA: Diagnosis not present

## 2018-02-04 DIAGNOSIS — K219 Gastro-esophageal reflux disease without esophagitis: Secondary | ICD-10-CM | POA: Diagnosis not present

## 2018-02-12 DIAGNOSIS — K219 Gastro-esophageal reflux disease without esophagitis: Secondary | ICD-10-CM | POA: Diagnosis not present

## 2018-02-12 DIAGNOSIS — I10 Essential (primary) hypertension: Secondary | ICD-10-CM | POA: Diagnosis not present

## 2018-02-12 DIAGNOSIS — Z960 Presence of urogenital implants: Secondary | ICD-10-CM | POA: Diagnosis not present

## 2018-02-12 DIAGNOSIS — Z792 Long term (current) use of antibiotics: Secondary | ICD-10-CM | POA: Diagnosis not present

## 2018-02-12 DIAGNOSIS — Z940 Kidney transplant status: Secondary | ICD-10-CM | POA: Diagnosis not present

## 2018-02-12 DIAGNOSIS — Z4822 Encounter for aftercare following kidney transplant: Secondary | ICD-10-CM | POA: Diagnosis not present

## 2018-02-12 DIAGNOSIS — D899 Disorder involving the immune mechanism, unspecified: Secondary | ICD-10-CM | POA: Diagnosis not present

## 2018-02-12 DIAGNOSIS — E119 Type 2 diabetes mellitus without complications: Secondary | ICD-10-CM | POA: Diagnosis not present

## 2018-02-18 NOTE — Progress Notes (Signed)
Office Visit Note  Patient: Colin Mcfarland             Date of Birth: 1965-02-08           MRN: [id number]             PCP: [name], MD Referring: [name], MD Visit Date: 03/03/2018 Occupation: @GUAROCC @  Subjective:  Pain of the Left Shoulder and Arthritis (Had Kidney transplant 02/04/2018)   History of Present Illness: Colin Mcfarland is a 53 y.o. male with history of gout and osteoarthritis.  He states he had right kidney transplant on February 04, 2018 which is gradually healing.  He states he has come off a lot of medications.  He has been advised to come off allopurinol as well.  He has colchicine only for as needed use which he has not used in a long time.  Denies any joint swelling at this point.  He has left shoulder joint impingement with limited range of motion.  He states he has been having nocturnal pain to sleep on the left side.  He has difficulty raising his arm.  Activities of Daily Living:  Patient reports morning stiffness for 0 none.   Patient Reports nocturnal pain.  Difficulty dressing/grooming: Denies Difficulty climbing stairs: Denies Difficulty getting out of chair: Denies Difficulty using hands for taps, buttons, cutlery, and/or writing: Denies  Review of Systems  Constitutional: Positive for fatigue. Negative for night sweats.  HENT: Positive for mouth dryness. Negative for mouth sores and nose dryness.   Eyes: Positive for dryness. Negative for redness.  Respiratory: Positive for shortness of breath. Negative for difficulty breathing.   Cardiovascular: Negative for chest pain, palpitations, hypertension, irregular heartbeat and swelling in legs/feet.  Gastrointestinal: Negative for constipation and diarrhea.  Endocrine: Positive for increased urination.  Genitourinary: Negative for difficulty urinating.  Musculoskeletal: Positive for arthralgias and joint pain. Negative for joint swelling, myalgias, muscle weakness, morning stiffness, muscle  tenderness and myalgias.  Skin: Negative for color change, rash, hair loss, nodules/bumps, skin tightness, ulcers and sensitivity to sunlight.  Allergic/Immunologic: Positive for susceptible to infections.  Neurological: Negative for dizziness, fainting, numbness, memory loss, night sweats and weakness ( ).  Hematological: Negative for bruising/bleeding tendency and swollen glands.  Psychiatric/Behavioral: Negative for depressed mood and sleep disturbance. The patient is not nervous/anxious.     PMFS History:  Patient Active Problem List   Diagnosis Date Noted  . Pneumonia 04/28/2017  . GERD (gastroesophageal reflux disease) 11/22/2015  . Shoulder impingement, left 11/22/2015  . Osteoarthritis of both feet 11/22/2015  . Osteoarthritis of both knees 11/22/2015  . 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  . Pain of left upper extremity 07/21/2013  . 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 Murmur 04/12/2013  . Syncope 04/12/2013  . Chronic kidney disease (CKD), stage IV (severe) ([place]) 03/02/2013  . End stage renal disease ([place]) 02/25/2012  . HYPOTHYROIDISM 01/14/2007  . Gout 01/14/2007  . HYPERTENSION 01/14/2007  . GASTRITIS, CHRONIC 01/14/2007  . IGA NEPHROPATHY 01/14/2007  . OTHER DYSPHAGIA 01/14/2007  . DIARRHEA 01/14/2007  . HELICOBACTER PYLORI INFECTION, HX OF 01/14/2007    Past Medical History:  Diagnosis Date  . Anemia   . Arthritis   . 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 Chronic kidney disease   . Eczema    hx: of  . GERD (gastroesophageal reflux disease)   . Hypertension   . Hypothyroidism   . 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  .  Osteoarthritis of both feet 11/22/2015  . Osteoarthritis of both knees 11/22/2015  . Pneumonia   . Shoulder impingement, left 11/22/2015    Family History  Problem Relation Age of Onset  . Diabetes Father   . Hypertension Father   . Cancer Mother   . Diabetes Brother     Past Surgical History:  Procedure Laterality Date  . AV FISTULA PLACEMENT Left 03/18/2013   Procedure: ARTERIOVENOUS (AV) FISTULA CREATION- BRACHIOCEPHALIC;  Surgeon: Mal Misty, MD;  Location: [place];  Service: Vascular;  Laterality: Left;  . CATARACT EXTRACTION    . HEMORRHOID SURGERY    . KIDNEY TRANSPLANT    . RENAL BIOPSY    . SHOULDER SURGERY     Social History   Social History Narrative  . Not on file   Immunization History  Administered Date(s) Administered  . Hepatitis B 07/15/2011, 08/15/2011, 12/06/2011     Objective: Vital Signs: BP 118/77 (BP Location: Right Arm, Patient Position: Sitting, Cuff Size: Normal)   Pulse 92   Resp 20   Ht 5\' 8"  (1.727 m)   Wt 158 lb 6.4 oz (71.8 kg)   BMI 24.08 kg/m    Physical Exam Vitals signs and nursing note reviewed.  Constitutional:      Appearance: He is well-developed.  HENT:     Head: Normocephalic and atraumatic.  Eyes:     Conjunctiva/sclera: Conjunctivae normal.     Pupils: Pupils are equal, round, and reactive to light.  Neck:     Musculoskeletal: Normal range of motion and neck supple.  Cardiovascular:     Rate and Rhythm: Normal rate and regular rhythm.     Heart sounds: Normal heart sounds.  Pulmonary:     Effort: Pulmonary effort is normal.     Breath sounds: Normal breath sounds.  Abdominal:     General: Bowel sounds are normal.     Palpations: Abdomen is soft.  Skin:    General: Skin is warm and dry.     Capillary Refill: Capillary refill takes less than 2 seconds.  Neurological:     Mental Status: He is alert and oriented to person, place, and time.  Psychiatric:        Behavior: Behavior normal.      Musculoskeletal Exam: Cervical spine good range of motion.  Left shoulder joint had limited abduction.  Elbow joints wrist joint MCPs PIPs DIPs with good range of motion with no synovitis.  Hip joints knee joints ankles MTPs PIPs been good range of motion with no synovitis.   CDAI Exam: CDAI  Score: Not documented Patient Global Assessment: Not documented; Provider Global Assessment: Not documented Swollen: Not documented; Tender: Not documented Joint Exam   Not documented   There is currently no information documented on the homunculus. Go to the Rheumatology activity and complete the homunculus joint exam.  Investigation: No additional findings.  Imaging: Xr Shoulder Left  Result Date: 03/03/2018 Severe glenohumeral joint space narrowing with anterior spurring was noted.  High rising humerus was noted.  No acromioclavicular joint space narrowing was noted.  No chondrocalcinosis was noted. 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   Recent Labs: Lab Results  Component Value Date   WBC 8.5 09/06/2017   HGB 13.1 09/06/2017   PLT 153 09/06/2017   NA 135 09/06/2017   K 3.8 09/06/2017   CL 98 09/06/2017   CO2 23 09/06/2017   GLUCOSE 110 (H) 09/06/2017   BUN 40 (H) 09/06/2017  CREATININE 7.40 (H) 09/06/2017   BILITOT 0.5 12/15/2015   ALKPHOS 51 12/15/2015   AST 26 12/15/2015   ALT 21 12/15/2015   PROT 6.8 12/15/2015   ALBUMIN 4.4 12/15/2015   CALCIUM 8.9 09/06/2017   GFRAA 9 (L) 09/06/2017    Speciality Comments: No specialty comments available.  Procedures:  No procedures performed Allergies: Blue dyes (parenteral); Uloric [febuxostat]; and Sulfa antibiotics   Assessment / Plan:     Visit Diagnoses: Chronic left shoulder pain -she does appear experiencing pain and discomfort in his left shoulder and limited range of motion.  He states the symptoms have been going for the last 2-1/2 years and have been progressively getting worse.  Plan: XR Shoulder Left.  The x-ray showed severe glenohumeral joint space narrowing.  We had detailed discussion regarding the shoulder issue.  He would benefit from total shoulder replacement in future if he wishes to undergo surgery.  Shoulder impingement, left  Idiopathic chronic gout of multiple sites  without tophus -he was on allopurinol 100 mg p.o. daily.  He states after his renal transplant he was taken off the allopurinol.  He has colchicine for as needed use which he had not used in a long time.  He has not had any flares of gout.  He has been taking prednisone postoperatively.  I do not have labs to show uric acid level.  Advised him to get uric acid level done at [facility] with his next labs.  Primary osteoarthritis of both knees - chondromalacia patella  Effusion, right knee - aspirated 05/27/2017 Syno fluid not inflammtory. No crystals detected.  His knee effusion has resolved and had no warmth swelling or effusion in his knee today.  Primary osteoarthritis of both feet  End stage renal disease ([place]) - He awaits kidney transplant. He is followed up with nephrology.  Renal transplant, status post - Right renal transplant February 04, 2018  History of gastroesophageal reflux (GERD)  History of hypertension -his blood pressure is normalized.  Orders: Orders Placed This Encounter  Procedures  . XR Shoulder Left   No orders of the defined types were placed in this encounter.     Follow-Up Instructions: Return in about 6 months (around 09/01/2018) for Gout, Osteoarthritis.   Bo Merino, MD  Note - This record has been created using Editor, commissioning.  Chart creation errors have been sought, but may not always  have been located. Such creation errors do not reflect on  the standard of medical care.

## 2018-02-19 DIAGNOSIS — Z940 Kidney transplant status: Secondary | ICD-10-CM | POA: Diagnosis not present

## 2018-02-19 DIAGNOSIS — R739 Hyperglycemia, unspecified: Secondary | ICD-10-CM | POA: Diagnosis not present

## 2018-02-19 DIAGNOSIS — Z87898 Personal history of other specified conditions: Secondary | ICD-10-CM | POA: Diagnosis not present

## 2018-02-19 DIAGNOSIS — T380X5D Adverse effect of glucocorticoids and synthetic analogues, subsequent encounter: Secondary | ICD-10-CM | POA: Diagnosis not present

## 2018-02-19 DIAGNOSIS — R7303 Prediabetes: Secondary | ICD-10-CM | POA: Diagnosis not present

## 2018-02-19 DIAGNOSIS — I10 Essential (primary) hypertension: Secondary | ICD-10-CM | POA: Diagnosis not present

## 2018-02-19 DIAGNOSIS — T380X5A Adverse effect of glucocorticoids and synthetic analogues, initial encounter: Secondary | ICD-10-CM | POA: Diagnosis not present

## 2018-02-19 DIAGNOSIS — Z4822 Encounter for aftercare following kidney transplant: Secondary | ICD-10-CM | POA: Diagnosis not present

## 2018-02-20 DIAGNOSIS — Z940 Kidney transplant status: Secondary | ICD-10-CM | POA: Diagnosis not present

## 2018-02-20 DIAGNOSIS — Z5181 Encounter for therapeutic drug level monitoring: Secondary | ICD-10-CM | POA: Diagnosis not present

## 2018-02-25 DIAGNOSIS — Z792 Long term (current) use of antibiotics: Secondary | ICD-10-CM | POA: Diagnosis not present

## 2018-02-25 DIAGNOSIS — D899 Disorder involving the immune mechanism, unspecified: Secondary | ICD-10-CM | POA: Diagnosis not present

## 2018-02-25 DIAGNOSIS — Z940 Kidney transplant status: Secondary | ICD-10-CM | POA: Diagnosis not present

## 2018-03-03 ENCOUNTER — Ambulatory Visit (INDEPENDENT_AMBULATORY_CARE_PROVIDER_SITE_OTHER): Payer: 59

## 2018-03-03 ENCOUNTER — Ambulatory Visit (INDEPENDENT_AMBULATORY_CARE_PROVIDER_SITE_OTHER): Payer: Medicare Other | Admitting: Rheumatology

## 2018-03-03 ENCOUNTER — Encounter: Payer: Self-pay | Admitting: Rheumatology

## 2018-03-03 VITALS — BP 118/77 | HR 92 | Resp 20 | Ht 68.0 in | Wt 158.4 lb

## 2018-03-03 DIAGNOSIS — M7542 Impingement syndrome of left shoulder: Secondary | ICD-10-CM

## 2018-03-03 DIAGNOSIS — G8929 Other chronic pain: Secondary | ICD-10-CM

## 2018-03-03 DIAGNOSIS — M170 Bilateral primary osteoarthritis of knee: Secondary | ICD-10-CM

## 2018-03-03 DIAGNOSIS — M1A09X0 Idiopathic chronic gout, multiple sites, without tophus (tophi): Secondary | ICD-10-CM | POA: Diagnosis not present

## 2018-03-03 DIAGNOSIS — M19072 Primary osteoarthritis, left ankle and foot: Secondary | ICD-10-CM | POA: Diagnosis not present

## 2018-03-03 DIAGNOSIS — M19071 Primary osteoarthritis, right ankle and foot: Secondary | ICD-10-CM

## 2018-03-03 DIAGNOSIS — Z8719 Personal history of other diseases of the digestive system: Secondary | ICD-10-CM

## 2018-03-03 DIAGNOSIS — M25512 Pain in left shoulder: Secondary | ICD-10-CM | POA: Diagnosis not present

## 2018-03-03 DIAGNOSIS — M25812 Other specified joint disorders, left shoulder: Secondary | ICD-10-CM

## 2018-03-03 DIAGNOSIS — N186 End stage renal disease: Secondary | ICD-10-CM | POA: Diagnosis not present

## 2018-03-03 DIAGNOSIS — Z8679 Personal history of other diseases of the circulatory system: Secondary | ICD-10-CM | POA: Diagnosis not present

## 2018-03-03 DIAGNOSIS — Z940 Kidney transplant status: Secondary | ICD-10-CM | POA: Diagnosis not present

## 2018-03-04 DIAGNOSIS — Z5181 Encounter for therapeutic drug level monitoring: Secondary | ICD-10-CM | POA: Diagnosis not present

## 2018-03-04 DIAGNOSIS — Z940 Kidney transplant status: Secondary | ICD-10-CM | POA: Diagnosis not present

## 2018-03-04 DIAGNOSIS — B259 Cytomegaloviral disease, unspecified: Secondary | ICD-10-CM | POA: Diagnosis not present

## 2018-03-04 DIAGNOSIS — B349 Viral infection, unspecified: Secondary | ICD-10-CM | POA: Diagnosis not present

## 2018-03-10 DIAGNOSIS — Z4822 Encounter for aftercare following kidney transplant: Secondary | ICD-10-CM | POA: Diagnosis not present

## 2018-03-10 DIAGNOSIS — Z940 Kidney transplant status: Secondary | ICD-10-CM | POA: Diagnosis not present

## 2018-03-11 DIAGNOSIS — Z5181 Encounter for therapeutic drug level monitoring: Secondary | ICD-10-CM | POA: Diagnosis not present

## 2018-03-11 DIAGNOSIS — Z940 Kidney transplant status: Secondary | ICD-10-CM | POA: Diagnosis not present

## 2018-03-12 DIAGNOSIS — T380X5A Adverse effect of glucocorticoids and synthetic analogues, initial encounter: Secondary | ICD-10-CM | POA: Diagnosis not present

## 2018-03-12 DIAGNOSIS — R739 Hyperglycemia, unspecified: Secondary | ICD-10-CM | POA: Diagnosis not present

## 2018-03-12 DIAGNOSIS — Z940 Kidney transplant status: Secondary | ICD-10-CM | POA: Diagnosis not present

## 2018-03-13 DIAGNOSIS — E038 Other specified hypothyroidism: Secondary | ICD-10-CM | POA: Diagnosis not present

## 2018-03-13 DIAGNOSIS — Z940 Kidney transplant status: Secondary | ICD-10-CM | POA: Diagnosis not present

## 2018-03-13 DIAGNOSIS — M12812 Other specific arthropathies, not elsewhere classified, left shoulder: Secondary | ICD-10-CM | POA: Diagnosis not present

## 2018-03-13 DIAGNOSIS — Z6824 Body mass index (BMI) 24.0-24.9, adult: Secondary | ICD-10-CM | POA: Diagnosis not present

## 2018-03-13 DIAGNOSIS — I10 Essential (primary) hypertension: Secondary | ICD-10-CM | POA: Diagnosis not present

## 2018-03-13 DIAGNOSIS — C7A8 Other malignant neuroendocrine tumors: Secondary | ICD-10-CM | POA: Diagnosis not present

## 2018-03-13 DIAGNOSIS — M109 Gout, unspecified: Secondary | ICD-10-CM | POA: Diagnosis not present

## 2018-03-13 DIAGNOSIS — E1122 Type 2 diabetes mellitus with diabetic chronic kidney disease: Secondary | ICD-10-CM | POA: Diagnosis not present

## 2018-03-18 DIAGNOSIS — Z940 Kidney transplant status: Secondary | ICD-10-CM | POA: Diagnosis not present

## 2018-03-18 DIAGNOSIS — Z5181 Encounter for therapeutic drug level monitoring: Secondary | ICD-10-CM | POA: Diagnosis not present

## 2018-03-18 DIAGNOSIS — E039 Hypothyroidism, unspecified: Secondary | ICD-10-CM | POA: Diagnosis not present

## 2018-03-18 DIAGNOSIS — M109 Gout, unspecified: Secondary | ICD-10-CM | POA: Diagnosis not present

## 2018-03-25 DIAGNOSIS — E1122 Type 2 diabetes mellitus with diabetic chronic kidney disease: Secondary | ICD-10-CM | POA: Diagnosis not present

## 2018-03-25 DIAGNOSIS — Z940 Kidney transplant status: Secondary | ICD-10-CM | POA: Diagnosis not present

## 2018-03-26 DIAGNOSIS — Z5181 Encounter for therapeutic drug level monitoring: Secondary | ICD-10-CM | POA: Diagnosis not present

## 2018-03-26 DIAGNOSIS — Z940 Kidney transplant status: Secondary | ICD-10-CM | POA: Diagnosis not present

## 2018-04-07 DIAGNOSIS — Z1159 Encounter for screening for other viral diseases: Secondary | ICD-10-CM | POA: Diagnosis not present

## 2018-04-07 DIAGNOSIS — Z940 Kidney transplant status: Secondary | ICD-10-CM | POA: Diagnosis not present

## 2018-04-07 DIAGNOSIS — Z114 Encounter for screening for human immunodeficiency virus [HIV]: Secondary | ICD-10-CM | POA: Diagnosis not present

## 2018-04-09 DIAGNOSIS — T380X5A Adverse effect of glucocorticoids and synthetic analogues, initial encounter: Secondary | ICD-10-CM | POA: Diagnosis not present

## 2018-04-09 DIAGNOSIS — R739 Hyperglycemia, unspecified: Secondary | ICD-10-CM | POA: Diagnosis not present

## 2018-04-09 DIAGNOSIS — Z940 Kidney transplant status: Secondary | ICD-10-CM | POA: Diagnosis not present

## 2018-04-09 DIAGNOSIS — Z5181 Encounter for therapeutic drug level monitoring: Secondary | ICD-10-CM | POA: Diagnosis not present

## 2018-04-15 DIAGNOSIS — L738 Other specified follicular disorders: Secondary | ICD-10-CM | POA: Diagnosis not present

## 2018-04-15 DIAGNOSIS — L433 Subacute (active) lichen planus: Secondary | ICD-10-CM | POA: Diagnosis not present

## 2018-04-15 DIAGNOSIS — L218 Other seborrheic dermatitis: Secondary | ICD-10-CM | POA: Diagnosis not present

## 2018-04-20 ENCOUNTER — Other Ambulatory Visit: Payer: Self-pay

## 2018-04-20 ENCOUNTER — Telehealth (INDEPENDENT_AMBULATORY_CARE_PROVIDER_SITE_OTHER): Payer: Medicare Other | Admitting: Gastroenterology

## 2018-04-20 ENCOUNTER — Encounter: Payer: Self-pay | Admitting: Gastroenterology

## 2018-04-20 VITALS — Ht 68.0 in | Wt 158.0 lb

## 2018-04-20 DIAGNOSIS — K219 Gastro-esophageal reflux disease without esophagitis: Secondary | ICD-10-CM

## 2018-04-20 MED ORDER — LANSOPRAZOLE 30 MG PO CPDR: 30.0000 mg | DELAYED_RELEASE_CAPSULE | Freq: Every day | ORAL | 0 refills | Status: DC

## 2018-04-20 MED ORDER — LANSOPRAZOLE 30 MG PO CPDR: 30.0000 mg | DELAYED_RELEASE_CAPSULE | Freq: Every day | ORAL | 3 refills | Status: DC

## 2018-04-20 NOTE — Progress Notes (Signed)
Chief Complaint: FU  Referring Provider:  [name], MD      ASSESSMENT AND PLAN;   #1. Chronic constipation. #2. GERD. #3. 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 H/O TAs #5. ESRD d/t IgA nephropathy s/p renal transplant Colin Mcfarland Jan 2020 on prograf/cellcept/pred/acyclovir.  Plan: - Decrease pravacid 35m QOD.  - Continue miralax 17g po QD. - [facility] colon Jan 2021 [facility]   HPI:    ACarsyn Taubmanis a 53y.o. 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 [facility]  Doing well after transplant.  Has gone back to work starting yesterday.  He is working from home.  Intermittent heartburn.  Had tried Prevacid on PRN basis but would start having reflux symptoms after 2 days.  No odynophagia or dysphagia.  No further oral ulcers.  Has history of chronic constipation.  Currently better with MiraLAX 17 g p.o. once a day.  Has been using Dulcolax on as-needed basis as well.  No melena or hematochezia  [facility]  Normal hemoglobin.  Normal creatinine.  Urinalysis was unremarkable.   Past GI proc: -colon 11/2014 (fair prep), 6 mm polyp s/p polypectomy ([facility]). [place], EUS 11/14/2017  Past Medical History:  Diagnosis Date  . Anemia   . Arthritis   . 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 Chronic kidney disease   . Eczema    hx: of  . GERD (gastroesophageal reflux disease)   . Gout   . Hypertension   . Hypothyroidism   . 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  . Osteoarthritis of both feet 11/22/2015  . Osteoarthritis of both knees 11/22/2015  . Pneumonia   . Shoulder  impingement, left 11/22/2015    Past Surgical History:  Procedure Laterality Date  . AV FISTULA PLACEMENT Left 03/18/2013   Procedure: ARTERIOVENOUS (AV) FISTULA CREATION- BRACHIOCEPHALIC;  Surgeon: JMal Misty MD;  [place]  Service: Vascular;  Laterality: Left;  . CATARACT EXTRACTION    . COLONOSCOPY  11/25/2014   Colon polyp, staus post polypectomy. Internal Hemorrhoids. Otherwise normal colonoscopy to terminal ileum  . HEMORRHOID SURGERY    . KIDNEY TRANSPLANT    . RENAL BIOPSY    . SHOULDER SURGERY      Family History  Problem Relation Age of Onset  . Diabetes Father   . Hypertension Father   . Cancer Mother   . Diabetes Brother     Social History   Tobacco Use  . Smoking status: Never Smoker  . Smokeless tobacco: Never Used  Substance Use Topics  . Alcohol use: No  . Drug use: Never    Current Outpatient Medications  Medication Sig Dispense Refill  . acyclovir (ZOVIRAX) 400 MG tablet Take by mouth.    .Marland Kitchenamoxicillin (AMOXIL) 500 MG capsule Take by mouth. With Dental procedures    . aspirin EC 81 MG tablet Take by mouth.    .Marland Kitchenatovaquone (MEPRON) 750 MG/5ML suspension Take 1,500 mg by mouth.     . Blood Glucose Monitoring Suppl (FIFTY50 GLUCOSE METER 2.0) w/Device KIT Use as directed Product selection permitted according to insurance preference. E09.65 Drug induced diabetes    .  Blood Glucose Monitoring Suppl (ONETOUCH VERIO) w/Device KIT     . calcium acetate (PHOSLO) 667 MG capsule Take 667 mg by mouth 3 (three) times daily with meals.    . calcium carbonate (TUMS - DOSED IN MG ELEMENTAL CALCIUM) 500 MG chewable tablet Chew 1 tablet by mouth as needed.     . clobetasol cream (TEMOVATE) 7.28 % Apply 1 application topically as directed.    Marland Kitchen glucose blood (PRECISION QID TEST) test strip Use 1 each (1 strip total) 4 (four) times daily Product selection permitted according to insurance preference. E09.65 Drug induced diabetes    . HYDROcodone-acetaminophen  (NORCO/VICODIN) 5-325 MG per tablet Take 1 tablet by mouth every 6 (six) hours as needed for moderate pain (pain/gout flare ups). 30 tablet 0  . Insulin Syringe-Needle U-100 (BD VEO INSULIN SYRINGE U/F) 31G X 15/64" 0.3 ML MISC Use 1 Syringe 3 (three) times daily before meals    . Lancets (ONETOUCH DELICA PLUS [id number]) MISC     . Lancets Misc. (UNISTIK 2 NORMAL) MISC Use 1 each 4 (four) times daily Product selection permitted according to insurance preference. E09.65 Drug induced diabetes    . lansoprazole (PREVACID) 30 MG capsule Take 30 mg by mouth at bedtime. As needed    . levothyroxine (SYNTHROID, LEVOTHROID) 125 MCG tablet Take 125 mcg by mouth daily.    . mycophenolate (CELLCEPT) 250 MG capsule Take 1,000 mg by mouth 2 (two) times daily.     Glory Rosebush VERIO test strip     . polyethylene glycol powder (MIRALAX) powder Take 1 Container by mouth daily. As needed    . predniSONE (DELTASONE) 5 MG tablet Take 4 tablets x2 days, 3 tablets x2 days, 2 tablets x2 days, 1 tablet x2 days. 20 tablet 0  . tacrolimus (PROGRAF) 1 MG capsule Take 1 mg by mouth 2 (two) times daily.     . tamsulosin (FLOMAX) 0.4 MG CAPS capsule Take 0.4 mg by mouth as directed.    . furosemide (LASIX) 80 MG tablet Take 80 mg by mouth as directed.    . insulin regular (NOVOLIN R,HUMULIN R) 100 units/mL injection As needed     No current facility-administered medications for this visit.     Allergies  Allergen Reactions  . Blue Dyes (Parenteral) Hives  . Uloric [Febuxostat] Other (See Comments)    Joints stiffened  . Sulfa Antibiotics Rash    Review of Systems:  neg       Physical Exam:   Not performed since it was a tele-visit  This service was provided via telemedicine.  The patient was located at home.  The provider was located in office.  The patient did consent to this telephone visit and is aware of possible charges through their insurance for this visit.    Time spent on call: 15 min   Carmell Austria,  MD 04/20/2018, 1:48 PM  Cc: [name], MD

## 2018-04-20 NOTE — Patient Instructions (Signed)
Plan: - Decrease pravacid 30mg  QD. ([place],) [employer] Rx (#90, 4 refills) - Continue miralax 17g po  - Rpt colon Jan 2021.

## 2018-05-07 DIAGNOSIS — Z940 Kidney transplant status: Secondary | ICD-10-CM | POA: Diagnosis not present

## 2018-05-29 DIAGNOSIS — Z940 Kidney transplant status: Secondary | ICD-10-CM | POA: Diagnosis not present

## 2018-05-29 DIAGNOSIS — Z5181 Encounter for therapeutic drug level monitoring: Secondary | ICD-10-CM | POA: Diagnosis not present

## 2018-06-12 DIAGNOSIS — R400 Somnolence: Secondary | ICD-10-CM | POA: Diagnosis not present

## 2018-06-15 DIAGNOSIS — Z961 Presence of intraocular lens: Secondary | ICD-10-CM | POA: Diagnosis not present

## 2018-06-15 DIAGNOSIS — Z9842 Cataract extraction status, left eye: Secondary | ICD-10-CM | POA: Diagnosis not present

## 2018-06-16 DIAGNOSIS — Z940 Kidney transplant status: Secondary | ICD-10-CM | POA: Diagnosis not present

## 2018-06-22 DIAGNOSIS — H2511 Age-related nuclear cataract, right eye: Secondary | ICD-10-CM | POA: Diagnosis not present

## 2018-07-02 DIAGNOSIS — T380X5A Adverse effect of glucocorticoids and synthetic analogues, initial encounter: Secondary | ICD-10-CM | POA: Diagnosis not present

## 2018-07-02 DIAGNOSIS — R739 Hyperglycemia, unspecified: Secondary | ICD-10-CM | POA: Diagnosis not present

## 2018-07-14 DIAGNOSIS — Z940 Kidney transplant status: Secondary | ICD-10-CM | POA: Diagnosis not present

## 2018-07-14 DIAGNOSIS — Z5181 Encounter for therapeutic drug level monitoring: Secondary | ICD-10-CM | POA: Diagnosis not present

## 2018-07-14 DIAGNOSIS — B349 Viral infection, unspecified: Secondary | ICD-10-CM | POA: Diagnosis not present

## 2018-07-14 DIAGNOSIS — B259 Cytomegaloviral disease, unspecified: Secondary | ICD-10-CM | POA: Diagnosis not present

## 2018-07-23 DIAGNOSIS — H2511 Age-related nuclear cataract, right eye: Secondary | ICD-10-CM | POA: Diagnosis not present

## 2018-07-23 DIAGNOSIS — H25011 Cortical age-related cataract, right eye: Secondary | ICD-10-CM | POA: Diagnosis not present

## 2018-07-28 DIAGNOSIS — E1122 Type 2 diabetes mellitus with diabetic chronic kidney disease: Secondary | ICD-10-CM | POA: Diagnosis not present

## 2018-07-28 DIAGNOSIS — E039 Hypothyroidism, unspecified: Secondary | ICD-10-CM | POA: Diagnosis not present

## 2018-07-28 DIAGNOSIS — K5900 Constipation, unspecified: Secondary | ICD-10-CM | POA: Diagnosis not present

## 2018-07-28 DIAGNOSIS — I10 Essential (primary) hypertension: Secondary | ICD-10-CM | POA: Diagnosis not present

## 2018-07-28 DIAGNOSIS — K219 Gastro-esophageal reflux disease without esophagitis: Secondary | ICD-10-CM | POA: Diagnosis not present

## 2018-07-28 DIAGNOSIS — Z940 Kidney transplant status: Secondary | ICD-10-CM | POA: Diagnosis not present

## 2018-07-28 DIAGNOSIS — C7A8 Other malignant neuroendocrine tumors: Secondary | ICD-10-CM | POA: Diagnosis not present

## 2018-07-28 DIAGNOSIS — E781 Pure hyperglyceridemia: Secondary | ICD-10-CM | POA: Diagnosis not present

## 2018-07-30 DIAGNOSIS — G4733 Obstructive sleep apnea (adult) (pediatric): Secondary | ICD-10-CM | POA: Diagnosis not present

## 2018-08-18 NOTE — Progress Notes (Deleted)
Office Visit Note  Patient: Colin Mcfarland             Date of Birth: [birth year]           MRN: [id number]             PCP: [name], MD Referring: [name], MD Visit Date: 09/01/2018 Occupation: @GUAROCC @  Subjective:  No chief complaint on file.   History of Present Illness: Colin Mcfarland is a 53 y.o. male ***   Activities of Daily Living:  Patient reports morning stiffness for *** {minute/hour:19697}.   Patient {ACTIONS;DENIES/REPORTS:21021675::"Denies"} nocturnal pain.  Difficulty dressing/grooming: {ACTIONS;DENIES/REPORTS:21021675::"Denies"} Difficulty climbing stairs: {ACTIONS;DENIES/REPORTS:21021675::"Denies"} Difficulty getting out of chair: {ACTIONS;DENIES/REPORTS:21021675::"Denies"} Difficulty using hands for taps, buttons, cutlery, and/or writing: {ACTIONS;DENIES/REPORTS:21021675::"Denies"}  No Rheumatology ROS completed.   PMFS History:  Patient Active Problem List   Diagnosis Date Noted  . Pneumonia 04/28/2017  . GERD (gastroesophageal reflux disease) 11/22/2015  . Shoulder impingement, left 11/22/2015  . Osteoarthritis of both feet 11/22/2015  . Osteoarthritis of both knees 11/22/2015  . 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  . Pain of left upper extremity 07/21/2013  . 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 Murmur 04/12/2013  . Syncope 04/12/2013  . Chronic kidney disease (CKD), stage IV (severe) ([place]) 03/02/2013  . End stage renal disease ([place]) 02/25/2012  . HYPOTHYROIDISM 01/14/2007  . Gout 01/14/2007  . HYPERTENSION 01/14/2007  . GASTRITIS, CHRONIC 01/14/2007  . IGA NEPHROPATHY 01/14/2007  . OTHER DYSPHAGIA 01/14/2007  . DIARRHEA 01/14/2007  . HELICOBACTER PYLORI INFECTION, HX OF 01/14/2007    Past Medical History:  Diagnosis Date  . Anemia   . Arthritis   . 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 Chronic kidney disease   . Eczema    hx: of  . GERD  (gastroesophageal reflux disease)   . Gout   . Hypertension   . Hypothyroidism   . 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  . Osteoarthritis of both feet 11/22/2015  . Osteoarthritis of both knees 11/22/2015  . Pneumonia   . Shoulder impingement, left 11/22/2015    Family History  Problem Relation Age of Onset  . Diabetes Father   . Hypertension Father   . Cancer Mother   . Diabetes Brother    Past Surgical History:  Procedure Laterality Date  . AV FISTULA PLACEMENT Left 03/18/2013   Procedure: ARTERIOVENOUS (AV) FISTULA CREATION- BRACHIOCEPHALIC;  Surgeon: Mal Misty, MD;  Location: [place];  Service: Vascular;  Laterality: Left;  . CATARACT EXTRACTION    . COLONOSCOPY  11/25/2014   Colon polyp, staus post polypectomy. Internal Hemorrhoids. Otherwise normal colonoscopy to terminal ileum  . HEMORRHOID SURGERY    . KIDNEY TRANSPLANT    . RENAL BIOPSY    . SHOULDER SURGERY     Social History   Social History Narrative  . Not on file   Immunization History  Administered Date(s) Administered  . Hepatitis B 07/15/2011, 08/15/2011, 12/06/2011     Objective: Vital Signs: There were no vitals taken for this visit.   Physical Exam   Musculoskeletal Exam: ***  CDAI Exam: CDAI Score: - Patient Global: -; Provider Global: - Swollen: -; Tender: - Joint Exam   No joint exam has been documented for this visit   There is currently no information documented on the homunculus. Go to the Rheumatology activity and complete the homunculus joint exam.  Investigation: No additional findings.  Imaging: No results found.  Recent Labs: Lab Results  Component Value Date   WBC 8.5 09/06/2017   HGB 13.1 09/06/2017   PLT 153 09/06/2017   NA 135 09/06/2017   K 3.8 09/06/2017   CL 98 09/06/2017   CO2 23 09/06/2017   GLUCOSE 110 (H) 09/06/2017   BUN 40 (H) 09/06/2017   CREATININE 7.40 (H) 09/06/2017   BILITOT 0.5 12/15/2015   ALKPHOS 51 12/15/2015   AST 26 12/15/2015   ALT 21 12/15/2015    PROT 6.8 12/15/2015   ALBUMIN 4.4 12/15/2015   CALCIUM 8.9 09/06/2017   GFRAA 9 (L) 09/06/2017    Speciality Comments: No specialty comments available.  Procedures:  No procedures performed Allergies: Blue dyes (parenteral), Uloric [febuxostat], and Sulfa antibiotics   Assessment / Plan:     Visit Diagnoses: No diagnosis found.  Orders: No orders of the defined types were placed in this encounter.  No orders of the defined types were placed in this encounter.   Face-to-face time spent with patient was *** minutes. Greater than 50% of time was spent in counseling and coordination of care.  Follow-Up Instructions: No follow-ups on file.   Earnestine Mealing, CMA  Note - This record has been created using Editor, commissioning.  Chart creation errors have been sought, but may not always  have been located. Such creation errors do not reflect on  the standard of medical care.

## 2018-08-21 ENCOUNTER — Ambulatory Visit (HOSPITAL_COMMUNITY): Payer: Medicare Other | Attending: Internal Medicine

## 2018-08-21 ENCOUNTER — Other Ambulatory Visit: Payer: Self-pay

## 2018-08-21 DIAGNOSIS — I350 Nonrheumatic aortic (valve) stenosis: Secondary | ICD-10-CM | POA: Insufficient documentation

## 2018-08-24 ENCOUNTER — Other Ambulatory Visit: Payer: Self-pay | Admitting: Nurse Practitioner

## 2018-08-24 DIAGNOSIS — I350 Nonrheumatic aortic (valve) stenosis: Secondary | ICD-10-CM

## 2018-08-25 DIAGNOSIS — Z940 Kidney transplant status: Secondary | ICD-10-CM | POA: Diagnosis not present

## 2018-08-31 DIAGNOSIS — Z20828 Contact with and (suspected) exposure to other viral communicable diseases: Secondary | ICD-10-CM | POA: Diagnosis not present

## 2018-09-01 ENCOUNTER — Ambulatory Visit: Payer: Self-pay | Admitting: Rheumatology

## 2018-09-11 DIAGNOSIS — Z794 Long term (current) use of insulin: Secondary | ICD-10-CM | POA: Diagnosis not present

## 2018-09-11 DIAGNOSIS — M109 Gout, unspecified: Secondary | ICD-10-CM | POA: Diagnosis not present

## 2018-09-11 DIAGNOSIS — K219 Gastro-esophageal reflux disease without esophagitis: Secondary | ICD-10-CM | POA: Diagnosis not present

## 2018-09-11 DIAGNOSIS — D899 Disorder involving the immune mechanism, unspecified: Secondary | ICD-10-CM | POA: Diagnosis not present

## 2018-09-11 DIAGNOSIS — Z4822 Encounter for aftercare following kidney transplant: Secondary | ICD-10-CM | POA: Diagnosis not present

## 2018-09-11 DIAGNOSIS — I10 Essential (primary) hypertension: Secondary | ICD-10-CM | POA: Diagnosis not present

## 2018-09-11 DIAGNOSIS — N028 Recurrent and persistent hematuria with other morphologic changes: Secondary | ICD-10-CM | POA: Diagnosis not present

## 2018-09-11 DIAGNOSIS — Z23 Encounter for immunization: Secondary | ICD-10-CM | POA: Diagnosis not present

## 2018-09-11 DIAGNOSIS — E119 Type 2 diabetes mellitus without complications: Secondary | ICD-10-CM | POA: Diagnosis not present

## 2018-09-11 DIAGNOSIS — E039 Hypothyroidism, unspecified: Secondary | ICD-10-CM | POA: Diagnosis not present

## 2018-09-11 DIAGNOSIS — Z940 Kidney transplant status: Secondary | ICD-10-CM | POA: Diagnosis not present

## 2018-09-11 DIAGNOSIS — Z79899 Other long term (current) drug therapy: Secondary | ICD-10-CM | POA: Diagnosis not present

## 2018-10-15 DIAGNOSIS — Z5189 Encounter for other specified aftercare: Secondary | ICD-10-CM | POA: Diagnosis not present

## 2018-10-15 DIAGNOSIS — Z87898 Personal history of other specified conditions: Secondary | ICD-10-CM | POA: Diagnosis not present

## 2018-10-15 DIAGNOSIS — R739 Hyperglycemia, unspecified: Secondary | ICD-10-CM | POA: Diagnosis not present

## 2018-10-15 DIAGNOSIS — Z940 Kidney transplant status: Secondary | ICD-10-CM | POA: Diagnosis not present

## 2018-10-15 DIAGNOSIS — T380X5A Adverse effect of glucocorticoids and synthetic analogues, initial encounter: Secondary | ICD-10-CM | POA: Diagnosis not present

## 2018-11-02 DIAGNOSIS — Z940 Kidney transplant status: Secondary | ICD-10-CM | POA: Diagnosis not present

## 2018-11-02 DIAGNOSIS — D849 Immunodeficiency, unspecified: Secondary | ICD-10-CM | POA: Diagnosis not present

## 2018-11-16 DIAGNOSIS — E039 Hypothyroidism, unspecified: Secondary | ICD-10-CM | POA: Diagnosis not present

## 2018-11-16 DIAGNOSIS — E119 Type 2 diabetes mellitus without complications: Secondary | ICD-10-CM | POA: Diagnosis not present

## 2018-11-19 DIAGNOSIS — Z940 Kidney transplant status: Secondary | ICD-10-CM | POA: Diagnosis not present

## 2018-11-19 DIAGNOSIS — Z09 Encounter for follow-up examination after completed treatment for conditions other than malignant neoplasm: Secondary | ICD-10-CM | POA: Diagnosis not present

## 2018-11-19 DIAGNOSIS — K219 Gastro-esophageal reflux disease without esophagitis: Secondary | ICD-10-CM | POA: Diagnosis not present

## 2018-11-19 DIAGNOSIS — Z8719 Personal history of other diseases of the digestive system: Secondary | ICD-10-CM | POA: Diagnosis not present

## 2018-11-19 DIAGNOSIS — I10 Essential (primary) hypertension: Secondary | ICD-10-CM | POA: Diagnosis not present

## 2018-11-19 DIAGNOSIS — Z79899 Other long term (current) drug therapy: Secondary | ICD-10-CM | POA: Diagnosis not present

## 2018-11-19 DIAGNOSIS — Z7952 Long term (current) use of systemic steroids: Secondary | ICD-10-CM | POA: Diagnosis not present

## 2018-11-19 DIAGNOSIS — N028 Recurrent and persistent hematuria with other morphologic changes: Secondary | ICD-10-CM | POA: Diagnosis not present

## 2018-11-19 DIAGNOSIS — Z7982 Long term (current) use of aspirin: Secondary | ICD-10-CM | POA: Diagnosis not present

## 2018-11-19 DIAGNOSIS — Z794 Long term (current) use of insulin: Secondary | ICD-10-CM | POA: Diagnosis not present

## 2018-11-19 DIAGNOSIS — E119 Type 2 diabetes mellitus without complications: Secondary | ICD-10-CM | POA: Diagnosis not present

## 2018-11-19 DIAGNOSIS — K317 Polyp of stomach and duodenum: Secondary | ICD-10-CM | POA: Diagnosis not present

## 2018-11-19 DIAGNOSIS — E039 Hypothyroidism, unspecified: Secondary | ICD-10-CM | POA: Diagnosis not present

## 2018-11-19 DIAGNOSIS — K3189 Other diseases of stomach and duodenum: Secondary | ICD-10-CM | POA: Diagnosis not present

## 2018-11-22 IMAGING — CT CT ABD-PELV W/O CM
2 of 4 series · 10 of 36 positions shown, 17 images · IV contrast (READICAT/WATER)
Comparison: None.

CLINICAL DATA: And it for kidney transplant, dialysis for 14 months

EXAM:
CT ABDOMEN AND PELVIS WITHOUT CONTRAST
TECHNIQUE: Multidetector CT imaging of the abdomen and pelvis was performed
following the standard protocol without IV contrast.

[Series 3: abd/pelvis w/o · axial · non-contrast · 0.74mm/px · z∈[-371,+9]mm · 9 of 96 slices shown, 15 images]
[im 10/96  soft-tissue]
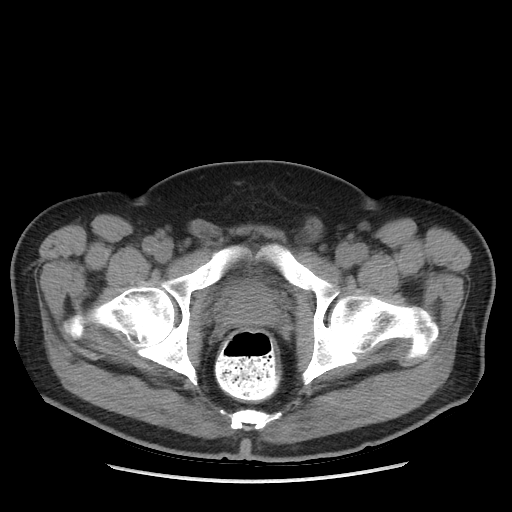
[im 10/96  bone]
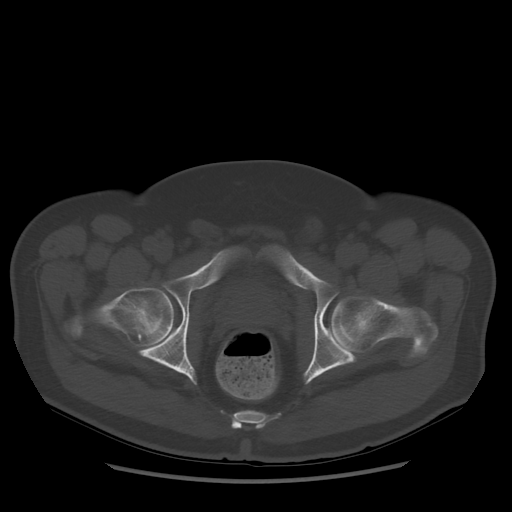
[im 20/96  soft-tissue]
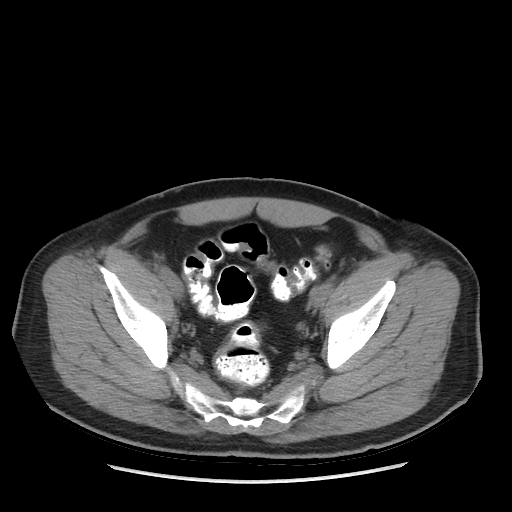
[im 29/96  soft-tissue]
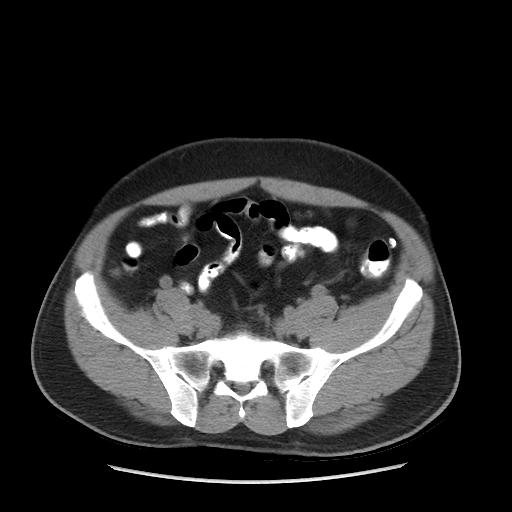
[im 39/96  soft-tissue]
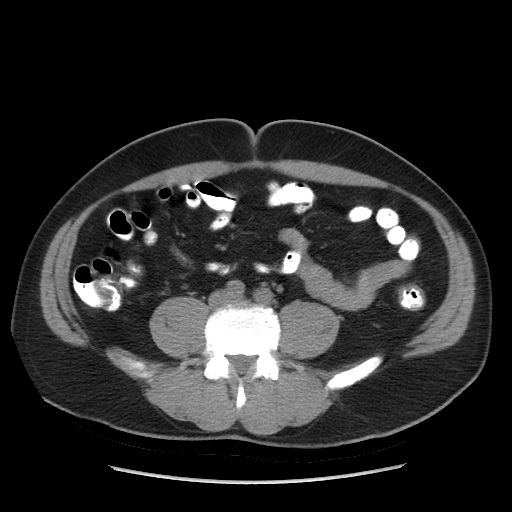
[im 48/96  soft-tissue]
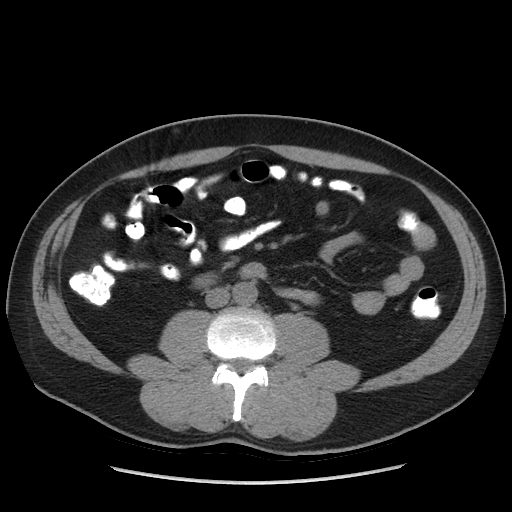
[im 58/96  soft-tissue]
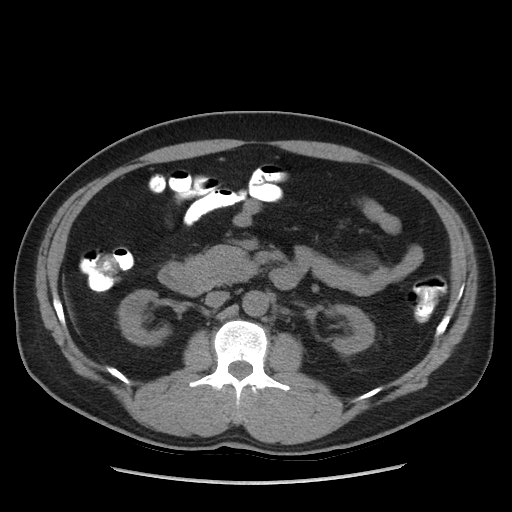
[im 58/96  lung]
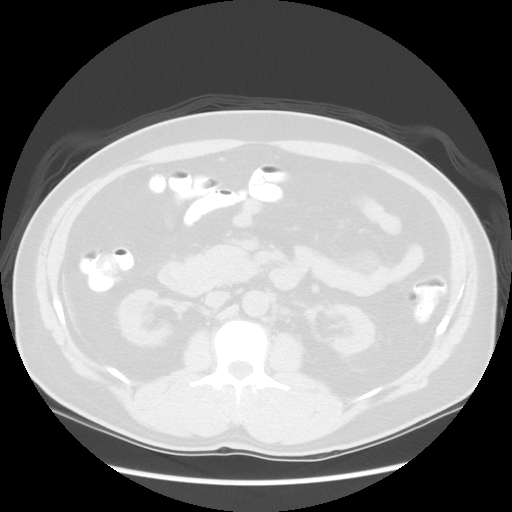
[im 67/96  soft-tissue]
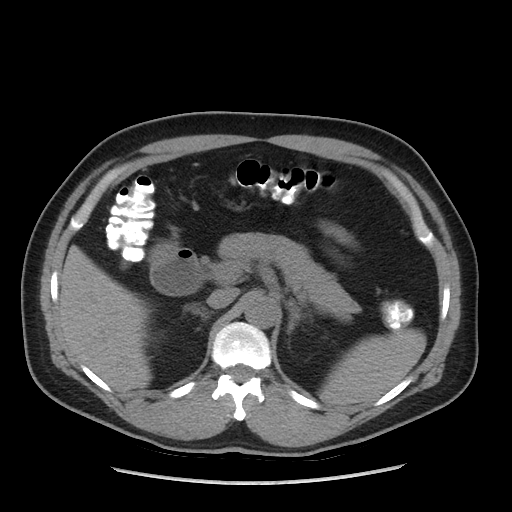
[im 67/96  lung]
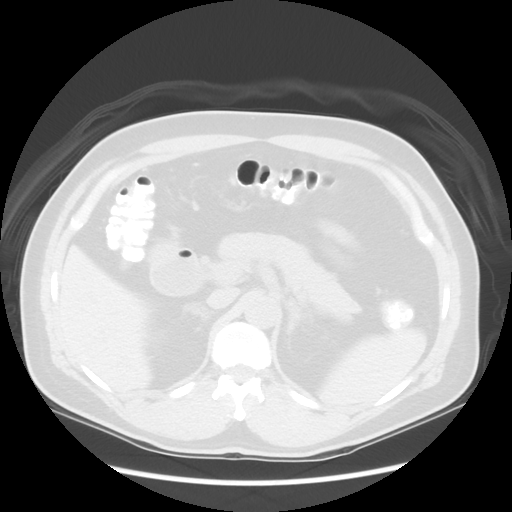
[im 77/96  soft-tissue]
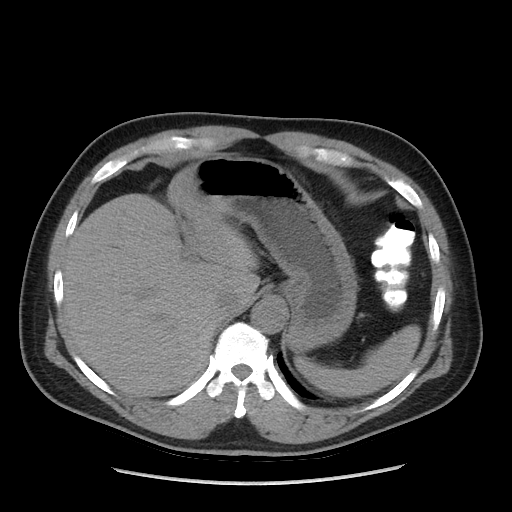
[im 77/96  lung]
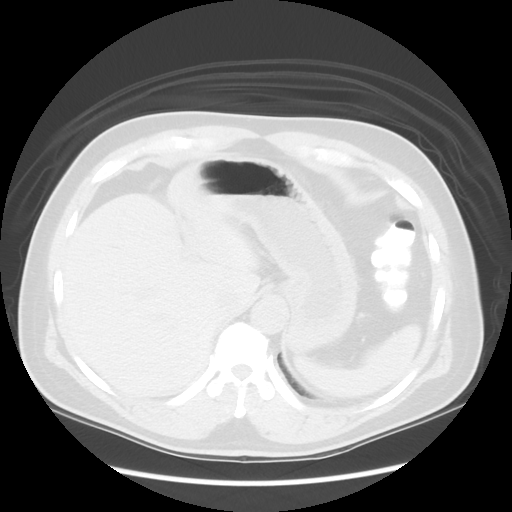
[im 86/96  soft-tissue]
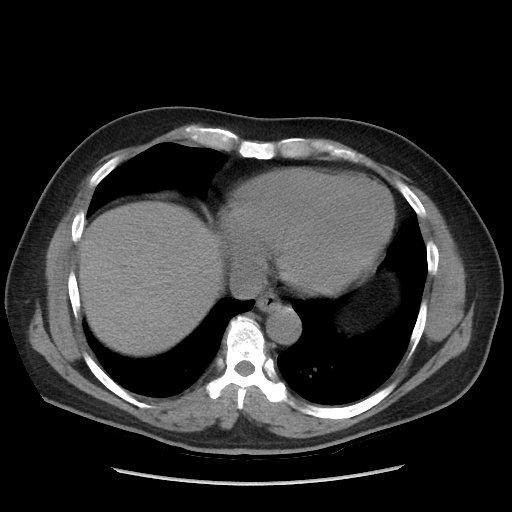
[im 86/96  lung]
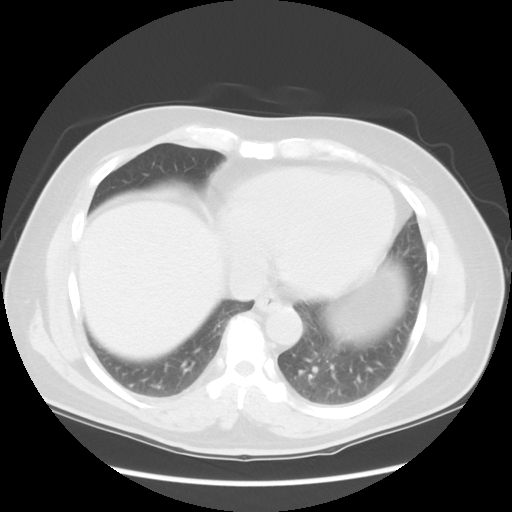
[im 86/96  bone]
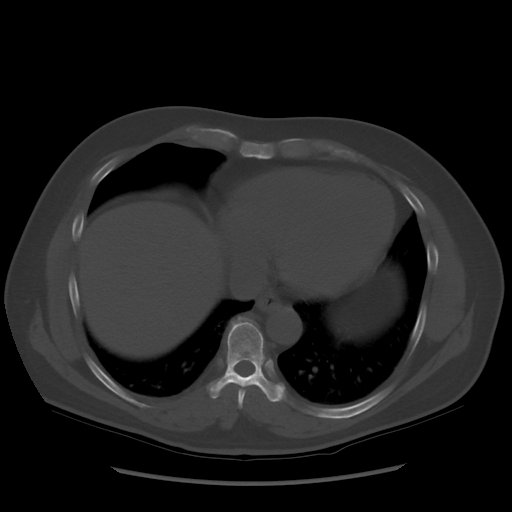

[Series 601: coronal body · coronal · 1.01mm/px · 1 of 109 slices shown, 2 images]
[im 37/109  soft-tissue]
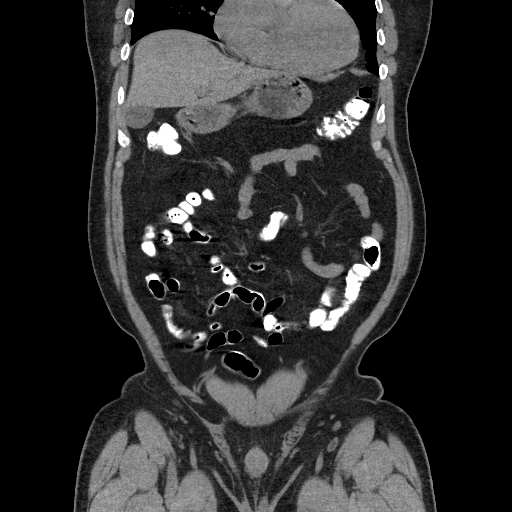
[im 37/109  bone]
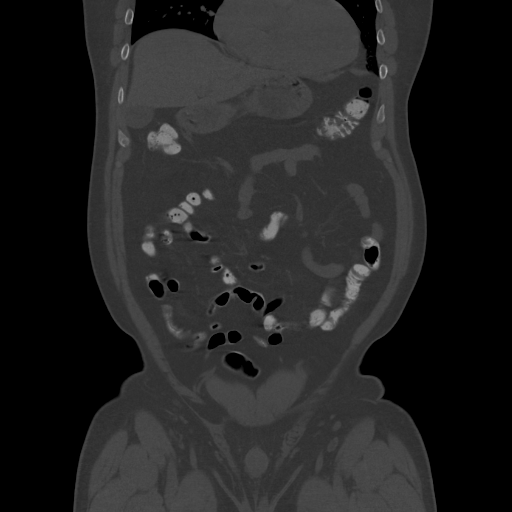

[10 of 36 positions shown; findings below may reference images not displayed]

FINDINGS: Lower chest: Minimal haziness posterior medially in the right lower
lobe as on image 5 series [DATE] be due to scarring, but patchy
pneumonia cannot be excluded. Correlate clinically.

Hepatobiliary: The liver is unremarkable in the unenhanced state. No
calcified gallstones are seen.

Pancreas: The pancreas is normal in size in the pancreatic duct is
not dilated.

Spleen: The spleen is unremarkable.

Adrenals/Urinary Tract: The adrenal glands appear normal. There is a
large cyst emanating from the upper pole of the right kidney
measuring 3.9 cm with attenuation of 15 HU. Peripherally a portion
of this cyst is calcified and ultrasound may help may be help to
assess further. A probable small cyst is noted in the medial
anterior right kidney. No hydronephrosis is seen. The ureters are
normal in caliber. The urinary bladder is not well distended.

Stomach/Bowel: The stomach is minimally distended with fluid. No
abnormality is seen. No small bowel distention is noted. The
rectosigmoid colon is somewhat tortuous. There are a few scattered
diverticula present. The colon is largely decompressed. The appendix
is somewhat prominent measuring 7 mm in diameter. However the
periappendiceal flat planes appear well preserved and there is no
other evidence to indicate acute appendicitis. Clinical correlation
is recommended. This measurement of the appendix is within upper
limits of normal.

Vascular/Lymphatic: The abdominal aorta is normal in caliber with
mild abdominal aortic atherosclerosis present. No adenopathy is
seen.

Reproductive: The prostate gland is normal in size.

Other: None

Musculoskeletal: The lumbar vertebrae are in normal alignment. No
compression deformity is seen.
IMPRESSION: 1. No hydronephrosis. Complex right upper pole renal cyst of 3.9 cm.
Consider renal ultrasound to assess further.
2. The appendix measures within upper limits of normal. No other
evidence indicate acute appendicitis is seen. Clinical correlation
is recommended.
3. Mild abdominal aortic atherosclerosis.
4. Mild haziness in the posteromedial right lower lobe. This may
represent scarring, but a patchy area of pneumonia cannot be
excluded. Correlate clinically.

## 2018-12-02 DIAGNOSIS — D849 Immunodeficiency, unspecified: Secondary | ICD-10-CM | POA: Diagnosis not present

## 2018-12-02 DIAGNOSIS — Z940 Kidney transplant status: Secondary | ICD-10-CM | POA: Diagnosis not present

## 2018-12-08 DIAGNOSIS — M12812 Other specific arthropathies, not elsewhere classified, left shoulder: Secondary | ICD-10-CM | POA: Diagnosis not present

## 2018-12-08 DIAGNOSIS — E1122 Type 2 diabetes mellitus with diabetic chronic kidney disease: Secondary | ICD-10-CM | POA: Diagnosis not present

## 2018-12-08 DIAGNOSIS — E781 Pure hyperglyceridemia: Secondary | ICD-10-CM | POA: Diagnosis not present

## 2018-12-08 DIAGNOSIS — E039 Hypothyroidism, unspecified: Secondary | ICD-10-CM | POA: Diagnosis not present

## 2018-12-08 DIAGNOSIS — C7A8 Other malignant neuroendocrine tumors: Secondary | ICD-10-CM | POA: Diagnosis not present

## 2018-12-08 DIAGNOSIS — Z940 Kidney transplant status: Secondary | ICD-10-CM | POA: Diagnosis not present

## 2018-12-08 DIAGNOSIS — K219 Gastro-esophageal reflux disease without esophagitis: Secondary | ICD-10-CM | POA: Diagnosis not present

## 2018-12-08 DIAGNOSIS — I10 Essential (primary) hypertension: Secondary | ICD-10-CM | POA: Diagnosis not present

## 2018-12-08 DIAGNOSIS — M109 Gout, unspecified: Secondary | ICD-10-CM | POA: Diagnosis not present

## 2018-12-16 DIAGNOSIS — D849 Immunodeficiency, unspecified: Secondary | ICD-10-CM | POA: Diagnosis not present

## 2018-12-16 DIAGNOSIS — Z940 Kidney transplant status: Secondary | ICD-10-CM | POA: Diagnosis not present

## 2018-12-28 ENCOUNTER — Other Ambulatory Visit: Payer: Self-pay | Admitting: Urology

## 2019-01-05 ENCOUNTER — Encounter: Payer: Self-pay | Admitting: Gastroenterology

## 2019-02-03 DIAGNOSIS — Z940 Kidney transplant status: Secondary | ICD-10-CM | POA: Diagnosis not present

## 2019-03-02 DIAGNOSIS — E039 Hypothyroidism, unspecified: Secondary | ICD-10-CM | POA: Diagnosis not present

## 2019-03-02 DIAGNOSIS — N401 Enlarged prostate with lower urinary tract symptoms: Secondary | ICD-10-CM | POA: Diagnosis not present

## 2019-03-02 DIAGNOSIS — N186 End stage renal disease: Secondary | ICD-10-CM | POA: Diagnosis not present

## 2019-03-02 DIAGNOSIS — I120 Hypertensive chronic kidney disease with stage 5 chronic kidney disease or end stage renal disease: Secondary | ICD-10-CM | POA: Diagnosis not present

## 2019-03-02 DIAGNOSIS — Z7952 Long term (current) use of systemic steroids: Secondary | ICD-10-CM | POA: Diagnosis not present

## 2019-03-02 DIAGNOSIS — D84821 Immunodeficiency due to drugs: Secondary | ICD-10-CM | POA: Diagnosis not present

## 2019-03-02 DIAGNOSIS — Z79899 Other long term (current) drug therapy: Secondary | ICD-10-CM | POA: Diagnosis not present

## 2019-03-02 DIAGNOSIS — T380X5A Adverse effect of glucocorticoids and synthetic analogues, initial encounter: Secondary | ICD-10-CM | POA: Diagnosis not present

## 2019-03-02 DIAGNOSIS — E1122 Type 2 diabetes mellitus with diabetic chronic kidney disease: Secondary | ICD-10-CM | POA: Diagnosis not present

## 2019-03-02 DIAGNOSIS — T8619 Other complication of kidney transplant: Secondary | ICD-10-CM | POA: Diagnosis not present

## 2019-03-02 DIAGNOSIS — Z794 Long term (current) use of insulin: Secondary | ICD-10-CM | POA: Diagnosis not present

## 2019-03-02 DIAGNOSIS — Z940 Kidney transplant status: Secondary | ICD-10-CM | POA: Diagnosis not present

## 2019-03-02 DIAGNOSIS — Z1159 Encounter for screening for other viral diseases: Secondary | ICD-10-CM | POA: Diagnosis not present

## 2019-03-02 DIAGNOSIS — M25512 Pain in left shoulder: Secondary | ICD-10-CM | POA: Diagnosis not present

## 2019-03-28 IMAGING — CR DG CHEST 2V
2 series · 2 of 2 positions shown · non-contrast
Comparison: 01/13/2015.

CLINICAL DATA: TB screen.

EXAM:
CHEST  2 VIEW

[w chest pa]
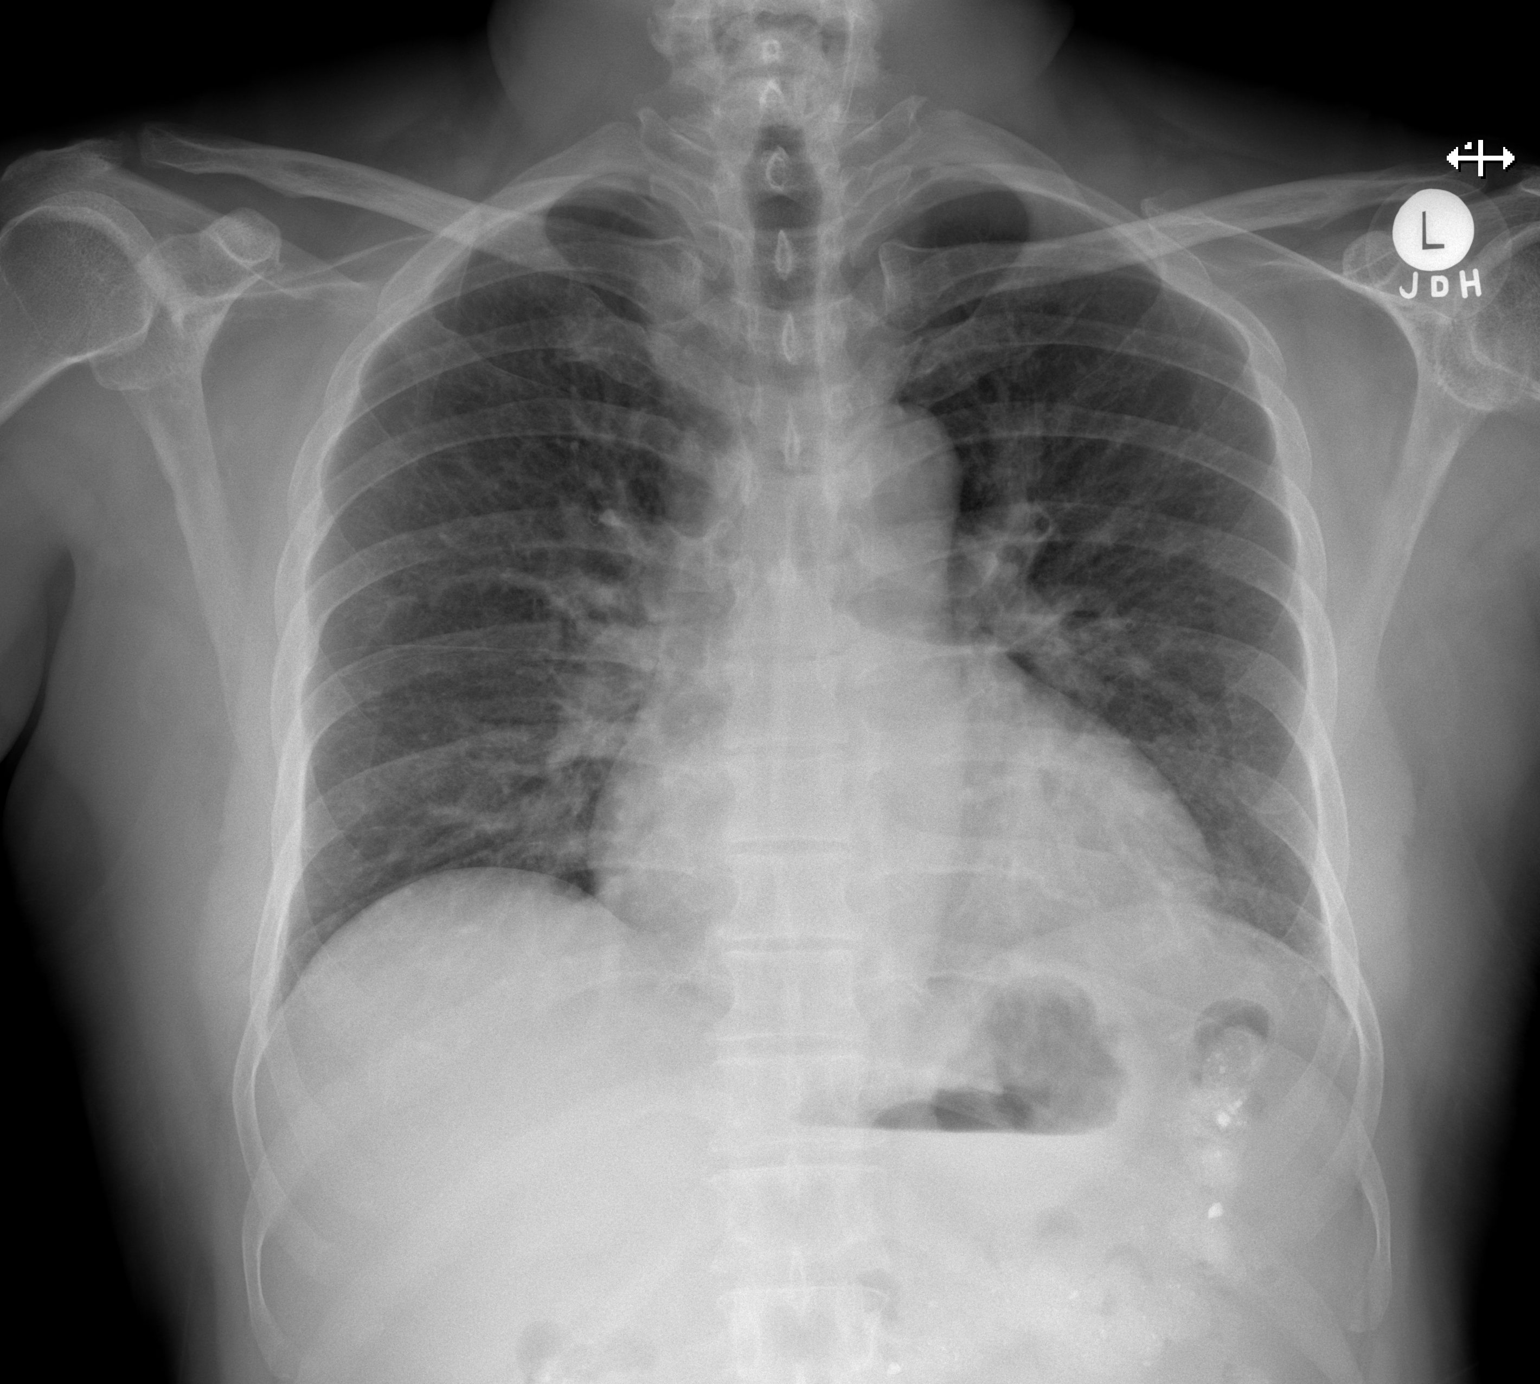

[w chest lat]
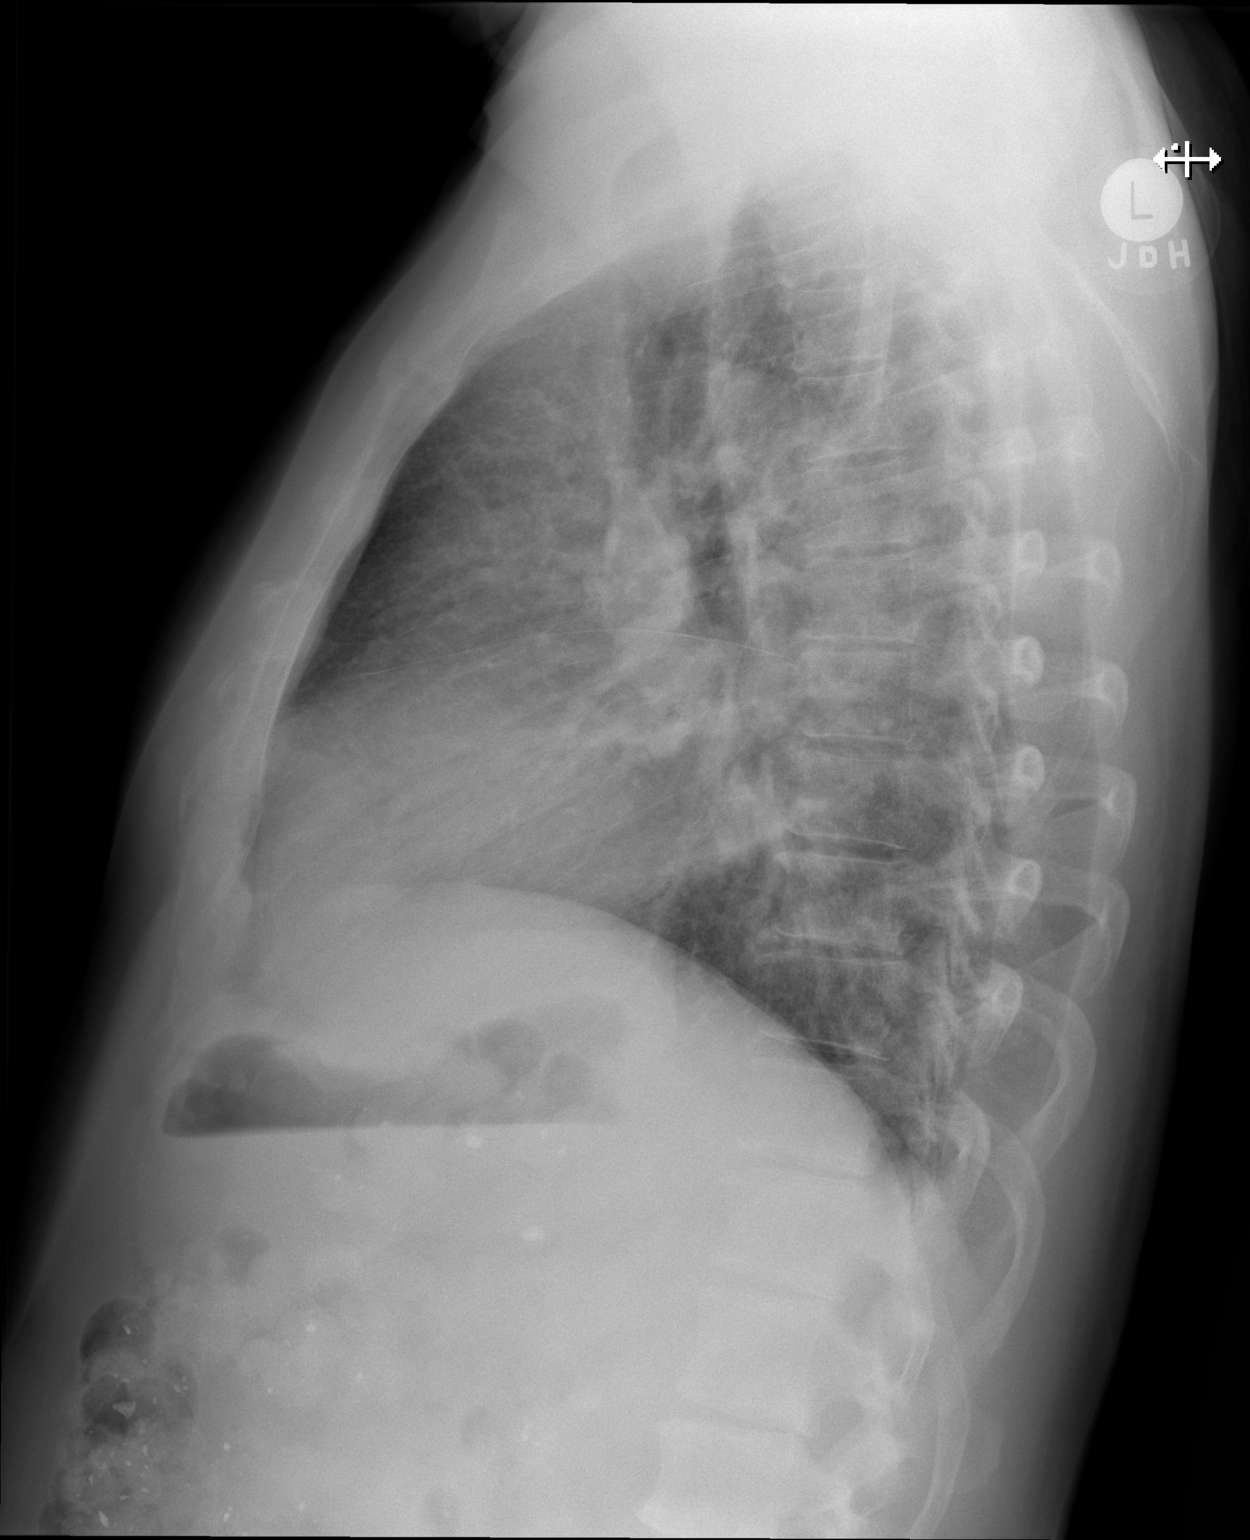

[2 of 2 positions shown; findings below may reference images not displayed]

FINDINGS: Low lung volumes accentuate the heart size which is within normal
limits. Bibasilar increased markings also reflect poor expiratory
effort. No apical disease or adenopathy. Radiodense material in the
colon, from previous contrast examination. No osseous findings.
IMPRESSION: Poor inspiratory effort. No active infiltrates or failure. Allowing
for degree of inspiration, similar appearance to priors.

## 2019-03-31 DIAGNOSIS — Z940 Kidney transplant status: Secondary | ICD-10-CM | POA: Diagnosis not present

## 2019-03-31 DIAGNOSIS — T8619 Other complication of kidney transplant: Secondary | ICD-10-CM | POA: Diagnosis not present

## 2019-04-20 DIAGNOSIS — E139 Other specified diabetes mellitus without complications: Secondary | ICD-10-CM | POA: Diagnosis not present

## 2019-04-29 DIAGNOSIS — R351 Nocturia: Secondary | ICD-10-CM | POA: Diagnosis not present

## 2019-04-29 DIAGNOSIS — E139 Other specified diabetes mellitus without complications: Secondary | ICD-10-CM | POA: Diagnosis not present

## 2019-04-29 DIAGNOSIS — N401 Enlarged prostate with lower urinary tract symptoms: Secondary | ICD-10-CM | POA: Diagnosis not present

## 2019-04-29 DIAGNOSIS — N5203 Combined arterial insufficiency and corporo-venous occlusive erectile dysfunction: Secondary | ICD-10-CM | POA: Diagnosis not present

## 2019-05-10 DIAGNOSIS — Z940 Kidney transplant status: Secondary | ICD-10-CM | POA: Diagnosis not present

## 2019-05-11 DIAGNOSIS — M25561 Pain in right knee: Secondary | ICD-10-CM | POA: Diagnosis not present

## 2019-05-11 DIAGNOSIS — M19012 Primary osteoarthritis, left shoulder: Secondary | ICD-10-CM | POA: Diagnosis not present

## 2019-05-11 DIAGNOSIS — M5137 Other intervertebral disc degeneration, lumbosacral region: Secondary | ICD-10-CM | POA: Diagnosis not present

## 2019-05-11 DIAGNOSIS — M545 Low back pain: Secondary | ICD-10-CM | POA: Diagnosis not present

## 2019-05-13 ENCOUNTER — Ambulatory Visit (INDEPENDENT_AMBULATORY_CARE_PROVIDER_SITE_OTHER): Payer: Medicare Other | Admitting: Gastroenterology

## 2019-05-13 ENCOUNTER — Encounter: Payer: Self-pay | Admitting: Gastroenterology

## 2019-05-13 VITALS — BP 118/70 | HR 81 | Temp 98.4°F | Ht 68.0 in | Wt 164.2 lb

## 2019-05-13 DIAGNOSIS — Z86010 Personal history of colonic polyps: Secondary | ICD-10-CM | POA: Diagnosis not present

## 2019-05-13 MED ORDER — MOVIPREP 100 G PO SOLR: 1.0000 | Freq: Once | ORAL | 0 refills | Status: AC

## 2019-05-13 NOTE — Progress Notes (Signed)
Chief Complaint: FU  Referring Provider:  [name], MD      ASSESSMENT AND PLAN;   #1. Chronic constipation. #2. GERD. #3. 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 H/O tubular adenomas. #5. ESRD d/t IgA nephropathy s/p renal transplant [facility] Jan 2020 on prograf/cellcept/pred/acyclovir.  Plan: - Decrease pravacid 28m PRN - Continue miralax 17g po QD. - Rpt colon May end 2021 with 2 day prep ([facility] and [facility]   HPI:    Colin Mcfarland a 54y.o. 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 [facility]  Doing well after transplant. Cr 0.8  No significant GI complaints except for constipation which is under good control with MiraLAX.  No nausea, vomiting, heartburn, regurgitation, odynophagia or dysphagia. No melena or hematochezia. No unintentional weight loss. No abdominal pain.   Past GI proc: -colon 11/2014 (fair prep), 6 mm polyp s/p polypectomy (Bx-TA). -[place] [place], EUS 11/14/2017, neg EGD 11/2018 for recurrence  Past Medical History:  Diagnosis Date  . Anemia   . Arthritis   . 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 Chronic kidney disease   . Eczema    hx: of  . GERD (gastroesophageal reflux disease)   . Gout   . Hypertension   . Hypothyroidism   . 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  . Osteoarthritis of both feet 11/22/2015  . Osteoarthritis of both knees 11/22/2015  . Pneumonia   . Shoulder impingement, left 11/22/2015    Past Surgical History:  Procedure Laterality Date  . AV FISTULA PLACEMENT Left 03/18/2013   Procedure: ARTERIOVENOUS (AV) FISTULA CREATION- BRACHIOCEPHALIC;  Surgeon: JMal Misty MD;  [facility]  Service: Vascular;  Laterality:  Left;  . CATARACT EXTRACTION     01/2018 left 07/2018 right   . COLONOSCOPY  11/25/2014   Colon polyp, staus post polypectomy. Internal Hemorrhoids. Otherwise normal colonoscopy to terminal ileum  . HEMORRHOID SURGERY    . KIDNEY TRANSPLANT  2020  . RENAL BIOPSY    . SHOULDER SURGERY      Family History  Problem Relation Age of Onset  . Diabetes Father   . Hypertension Father   . Cancer Mother   . Diabetes Brother     Social History   Tobacco Use  . Smoking status: Never Smoker  . Smokeless tobacco: Never Used  Substance Use Topics  . Alcohol use: No  . Drug use: Never    Current Outpatient Medications  Medication Sig Dispense Refill  . amoxicillin (AMOXIL) 500 MG capsule Take by mouth. With Dental procedures    . aspirin EC 81 MG tablet Take 81 mg by mouth daily.     . Blood Glucose Monitoring Suppl (ONETOUCH VERIO) w/Device KIT     . calcium carbonate (TUMS - DOSED IN MG ELEMENTAL CALCIUM) 500 MG chewable tablet Chew 1 tablet by mouth as needed.     . lansoprazole (PREVACID) 30 MG capsule Take 30 mg by mouth at bedtime. As needed    . levothyroxine (SYNTHROID, LEVOTHROID) 125 MCG tablet Take 125 mcg by mouth daily.    . mycophenolate (CELLCEPT) 250 MG capsule Take 1,000 mg by mouth 2 (two) times daily.     . polyethylene glycol (MIRALAX / GLYCOLAX) 17 g packet  Take 17 g by mouth daily.    . predniSONE (DELTASONE) 5 MG tablet Take 4 tablets x2 days, 3 tablets x2 days, 2 tablets x2 days, 1 tablet x2 days. (Patient taking differently: Take 5 mg by mouth daily. Take 4 tablets x2 days, 3 tablets x2 days, 2 tablets x2 days, 1 tablet x2 days.) 20 tablet 0  . tacrolimus (PROGRAF) 0.5 MG capsule Take 0.5 mg by mouth 2 (two) times daily.    . tamsulosin (FLOMAX) 0.4 MG CAPS capsule TAKE 1 CAPSULE BY MOUTH  EVERY NIGHT AT BEDTIME 90 capsule 3  . insulin regular (NOVOLIN R,HUMULIN R) 100 units/mL injection Inject into the skin as needed. As needed    . Insulin Syringe-Needle U-100  (BD VEO INSULIN SYRINGE U/F) 31G X 15/64" 0.3 ML MISC Use 1 Syringe 3 (three) times daily before meals    . JANUVIA 25 MG tablet Take 25 mg by mouth daily.    . Lancets (ONETOUCH DELICA PLUS [id number]) [place]     . ONETOUCH VERIO test strip      No current facility-administered medications for this visit.    Allergies  Allergen Reactions  . Blue Dyes (Parenteral) Hives  . Uloric [Febuxostat] Other (See Comments)    Joints stiffened  . Sevelamer Rash  . Sulfa Antibiotics Rash    Review of Systems:  neg       Physical Exam:    Gen: awake, alert, NAD HEENT: anicteric, no pallor CV: RRR, no mrg Pulm: CTA b/l Abd: soft, NT/ND, +BS throughout, kidney transplant right lower quadrant. Ext: no c/c/e Neuro: nonfocal    Carmell Austria, MD 05/13/2019, 4:29 PM  Cc: [name], MD

## 2019-05-13 NOTE — Patient Instructions (Signed)
If you are age 55 or older, your body mass index should be between 23-30. Your Body mass index is 24.97 kg/m. If this is out of the aforementioned range listed, please consider follow up with your Primary Care Provider.  If you are age 109 or younger, your body mass index should be between 19-25. Your Body mass index is 24.97 kg/m. If this is out of the aformentioned range listed, please consider follow up with your Primary Care Provider.   You have been scheduled for a colonoscopy. Please follow written instructions given to you at your visit today.  Please pick up your prep supplies at the pharmacy within the next 1-3 days. If you use inhalers (even only as needed), please bring them with you on the day of your procedure. Your physician has requested that you go to www.startemmi.com and enter the access code given to you at your visit today. This web site gives a general overview about your procedure. However, you should still follow specific instructions given to you by our office regarding your preparation for the procedure.  Two days before your procedure: Mix 3 packs (or capfuls) of Miralax in 48 ounces of clear liquid and drink at 6pm.  Thank you,  Dr. Jackquline Denmark

## 2019-05-14 DIAGNOSIS — M545 Low back pain: Secondary | ICD-10-CM | POA: Diagnosis not present

## 2019-05-19 DIAGNOSIS — M545 Low back pain: Secondary | ICD-10-CM | POA: Diagnosis not present

## 2019-05-21 DIAGNOSIS — M545 Low back pain: Secondary | ICD-10-CM | POA: Diagnosis not present

## 2019-05-21 DIAGNOSIS — Z125 Encounter for screening for malignant neoplasm of prostate: Secondary | ICD-10-CM | POA: Diagnosis not present

## 2019-05-21 DIAGNOSIS — E781 Pure hyperglyceridemia: Secondary | ICD-10-CM | POA: Diagnosis not present

## 2019-05-21 DIAGNOSIS — E1122 Type 2 diabetes mellitus with diabetic chronic kidney disease: Secondary | ICD-10-CM | POA: Diagnosis not present

## 2019-05-21 DIAGNOSIS — M109 Gout, unspecified: Secondary | ICD-10-CM | POA: Diagnosis not present

## 2019-05-21 DIAGNOSIS — E038 Other specified hypothyroidism: Secondary | ICD-10-CM | POA: Diagnosis not present

## 2019-05-26 DIAGNOSIS — K635 Polyp of colon: Secondary | ICD-10-CM | POA: Diagnosis not present

## 2019-05-26 DIAGNOSIS — E781 Pure hyperglyceridemia: Secondary | ICD-10-CM | POA: Diagnosis not present

## 2019-05-26 DIAGNOSIS — Z940 Kidney transplant status: Secondary | ICD-10-CM | POA: Diagnosis not present

## 2019-05-26 DIAGNOSIS — E1122 Type 2 diabetes mellitus with diabetic chronic kidney disease: Secondary | ICD-10-CM | POA: Diagnosis not present

## 2019-05-26 DIAGNOSIS — I10 Essential (primary) hypertension: Secondary | ICD-10-CM | POA: Diagnosis not present

## 2019-05-26 DIAGNOSIS — M109 Gout, unspecified: Secondary | ICD-10-CM | POA: Diagnosis not present

## 2019-05-26 DIAGNOSIS — Z0000 Encounter for general adult medical examination without abnormal findings: Secondary | ICD-10-CM | POA: Diagnosis not present

## 2019-05-26 DIAGNOSIS — C7A8 Other malignant neuroendocrine tumors: Secondary | ICD-10-CM | POA: Diagnosis not present

## 2019-05-26 DIAGNOSIS — G4733 Obstructive sleep apnea (adult) (pediatric): Secondary | ICD-10-CM | POA: Diagnosis not present

## 2019-05-26 DIAGNOSIS — M545 Low back pain: Secondary | ICD-10-CM | POA: Diagnosis not present

## 2019-05-26 DIAGNOSIS — Z1331 Encounter for screening for depression: Secondary | ICD-10-CM | POA: Diagnosis not present

## 2019-05-26 DIAGNOSIS — R82998 Other abnormal findings in urine: Secondary | ICD-10-CM | POA: Diagnosis not present

## 2019-05-26 DIAGNOSIS — E039 Hypothyroidism, unspecified: Secondary | ICD-10-CM | POA: Diagnosis not present

## 2019-05-26 DIAGNOSIS — F438 Other reactions to severe stress: Secondary | ICD-10-CM | POA: Diagnosis not present

## 2019-05-26 DIAGNOSIS — K219 Gastro-esophageal reflux disease without esophagitis: Secondary | ICD-10-CM | POA: Diagnosis not present

## 2019-05-28 DIAGNOSIS — M545 Low back pain: Secondary | ICD-10-CM | POA: Diagnosis not present

## 2019-06-01 ENCOUNTER — Other Ambulatory Visit: Payer: Self-pay

## 2019-06-01 MED ORDER — MOVIPREP 100 G PO SOLR: 1.0000 | Freq: Once | ORAL | 0 refills | Status: AC

## 2019-06-04 ENCOUNTER — Other Ambulatory Visit: Payer: Self-pay

## 2019-06-04 DIAGNOSIS — M545 Low back pain: Secondary | ICD-10-CM | POA: Diagnosis not present

## 2019-06-04 MED ORDER — PEG-KCL-NACL-NASULF-NA ASC-C 100 G PO SOLR: 1.0000 | Freq: Once | ORAL | 0 refills | Status: AC

## 2019-06-07 ENCOUNTER — Other Ambulatory Visit: Payer: Self-pay

## 2019-06-07 ENCOUNTER — Encounter: Payer: Self-pay | Admitting: Gastroenterology

## 2019-06-07 ENCOUNTER — Ambulatory Visit (AMBULATORY_SURGERY_CENTER): Payer: Medicare Other | Admitting: Gastroenterology

## 2019-06-07 VITALS — BP 133/70 | HR 68 | Temp 97.0°F | Resp 13 | Ht 68.0 in | Wt 164.0 lb

## 2019-06-07 DIAGNOSIS — Z86010 Personal history of colonic polyps: Secondary | ICD-10-CM

## 2019-06-07 DIAGNOSIS — N186 End stage renal disease: Secondary | ICD-10-CM | POA: Diagnosis not present

## 2019-06-07 DIAGNOSIS — D124 Benign neoplasm of descending colon: Secondary | ICD-10-CM

## 2019-06-07 DIAGNOSIS — K635 Polyp of colon: Secondary | ICD-10-CM | POA: Diagnosis not present

## 2019-06-07 DIAGNOSIS — E1122 Type 2 diabetes mellitus with diabetic chronic kidney disease: Secondary | ICD-10-CM | POA: Diagnosis not present

## 2019-06-07 MED ORDER — SODIUM CHLORIDE 0.9 % IV SOLN: 500.0000 mL | Freq: Once | INTRAVENOUS | Status: DC

## 2019-06-07 NOTE — Progress Notes (Signed)
Called to room to assist during endoscopic procedure.  Patient ID and intended procedure confirmed with present staff. Received instructions for my participation in the procedure from the performing physician.  

## 2019-06-07 NOTE — Patient Instructions (Signed)
Read all of the handouts given to you by your recovery room nurse.  Thank-you for choosing [place] for your healthcare needs today.  YOU HAD AN ENDOSCOPIC PROCEDURE TODAY AT [facility]:   Refer to the procedure report that was given to you for any specific questions about what was found during the examination.  If the procedure report does not answer your questions, please call your gastroenterologist to clarify.  If you requested that your care partner not be given the details of your procedure findings, then the procedure report has been included in a sealed envelope for you to review at your convenience later.  YOU SHOULD EXPECT: Some feelings of bloating in the abdomen. Passage of more gas than usual.  Walking can help get rid of the air that was put into your GI tract during the procedure and reduce the bloating. If you had a lower endoscopy (such as a colonoscopy or flexible sigmoidoscopy) you may notice spotting of blood in your stool or on the toilet paper. If you underwent a bowel prep for your procedure, you may not have a normal bowel movement for a few days.  Please Note:  You might notice some irritation and congestion in your nose or some drainage.  This is from the oxygen used during your procedure.  There is no need for concern and it should clear up in a day or so.  SYMPTOMS TO REPORT IMMEDIATELY:   Following lower endoscopy (colonoscopy or flexible sigmoidoscopy):  Excessive amounts of blood in the stool  Significant tenderness or worsening of abdominal pains  Swelling of the abdomen that is new, acute  Fever of 100F or higher   For urgent or emergent issues, a gastroenterologist can be reached at any hour by calling [phone]. Do not use MyChart messaging for urgent concerns.    DIET:  We do recommend a small meal at first, but then you may proceed to your regular diet.  Drink plenty of fluids but you should avoid alcoholic beverages for 24 hours. We  suggest a high fiber diet, and drink plenty of water.  ACTIVITY:  You should plan to take it easy for the rest of today and you should NOT DRIVE or use heavy machinery until tomorrow (because of the sedation medicines used during the test).    FOLLOW UP: Our staff will call the number listed on your records 48-72 hours following your procedure to check on you and address any questions or concerns that you may have regarding the information given to you following your procedure. If we do not reach you, we will leave a message.  We will attempt to reach you two times.  During this call, we will ask if you have developed any symptoms of COVID 19. If you develop any symptoms (ie: fever, flu-like symptoms, shortness of breath, cough etc.) before then, please call [phone].  If you test positive for Covid 19 in the 2 weeks post procedure, please call and report this information to [place].    If any biopsies were taken you will be contacted by phone or by letter within the next 1-3 weeks.  Please call us at [phone] if you have not heard about the biopsies in 3 weeks.    SIGNATURES/CONFIDENTIALITY: You and/or your care partner have signed paperwork which will be entered into your electronic medical record.  These signatures attest to the fact that that the information above on your After Visit Summary has been reviewed and is  understood.  Full responsibility of the confidentiality of this discharge information lies with you and/or your care-partner. 

## 2019-06-07 NOTE — Progress Notes (Signed)
Shunt to left upper arm.

## 2019-06-07 NOTE — Progress Notes (Signed)
Pt's states no medical or surgical changes since previsit or office visit. 

## 2019-06-07 NOTE — Progress Notes (Signed)
Report to PACU, RN, vss, BBS= Clear.  

## 2019-06-07 NOTE — Op Note (Signed)
[facility] Patient Name: Colin Mcfarland Procedure Date: 06/07/2019 9:07 AM MRN: [id number] Endoscopist: Jackquline Denmark , MD Age: 54 Referring MD:  Date of Birth: 1965/06/16 Gender: Male Account #: [id number] Procedure:                Colonoscopy Indications:              High risk colon cancer surveillance: Personal                            history of colonic polyps Medicines:                Monitored Anesthesia Care Procedure:                Pre-Anesthesia Assessment:                           - Prior to the procedure, a History and Physical                            was performed, and patient medications and                            allergies were reviewed. The patient's tolerance of                            previous anesthesia was also reviewed. The risks                            and benefits of the procedure and the sedation                            options and risks were discussed with the patient.                            All questions were answered, and informed consent                            was obtained. Prior Anticoagulants: The patient has                            taken no previous anticoagulant or antiplatelet                            agents. ASA Grade Assessment: II - A patient with                            mild systemic disease. After reviewing the risks                            and benefits, the patient was deemed in                            satisfactory condition to undergo the procedure.  After obtaining informed consent, the colonoscope                            was passed under direct vision. Throughout the                            procedure, the patient's blood pressure, pulse, and                            oxygen saturations were monitored continuously. The                            Colonoscope was introduced through the anus and                            advanced to the 2 cm into the ileum. The                        colonoscopy was performed without difficulty. The                            patient tolerated the procedure well. The quality                            of the bowel preparation was good. The terminal                            ileum, ileocecal valve, appendiceal orifice, and                            rectum were photographed. Scope In: 9:13:42 AM Scope Out: 9:28:09 AM Scope Withdrawal Time: 0 hours 0 minutes 16 seconds  Total Procedure Duration: 0 hours 14 minutes 26 seconds  Findings:                 A 2 mm polyp was found in the mid descending colon.                            The polyp was sessile. The polyp was removed with a                            cold biopsy forceps. Resection and retrieval were                            complete.                           A few rare small-mouthed diverticula were found in                            the sigmoid colon and ascending colon.                           Non-bleeding internal hemorrhoids were found during  retroflexion. The hemorrhoids were minimal.                           The terminal ileum appeared normal.                           The exam was otherwise without abnormality on                            direct and retroflexion views. Colon was highly                            redundant. Complications:            No immediate complications. Estimated Blood Loss:     Estimated blood loss: none. Impression:               - One 2 mm polyp in the mid descending colon,                            removed with a cold biopsy forceps. Resected and                            retrieved.                           - Minimal pancolonic diverticulosis.                           - Otherwise normal colonoscopy to TI. Recommendation:           - Patient has a contact number available for                            emergencies. The signs and symptoms of potential                            delayed  complications were discussed with the                            patient. Return to normal activities tomorrow.                            Written discharge instructions were provided to the                            patient.                           - Resume previous diet.                           - Continue present medications.                           - Await pathology results.                           -  Repeat colonoscopy in 7-10 years for surveillance                            based on pathology results.                           - Return to GI clinic PRN.                           - The findings and recommendations were discussed                            with the patient's family. Jackquline Denmark, MD 06/07/2019 9:32:51 AM This report has been signed electronically.

## 2019-06-09 ENCOUNTER — Telehealth: Payer: Self-pay

## 2019-06-09 DIAGNOSIS — M545 Low back pain: Secondary | ICD-10-CM | POA: Diagnosis not present

## 2019-06-09 NOTE — Telephone Encounter (Signed)
  Follow up Call-  Call back number 06/07/2019  Post procedure Call Back phone  # [phone]  Permission to leave phone message Yes  Some recent data might be hidden     Patient questions:  Do you have a fever, pain , or abdominal swelling? No. Pain Score  0 *  Have you tolerated food without any problems? Yes.    Have you been able to return to your normal activities? Yes.    Do you have any questions about your discharge instructions: Diet   No. Medications  No. Follow up visit  No.  Do you have questions or concerns about your Care? No.  Actions: * If pain score is 4 or above: No action needed, pain <4.  1. Have you developed a fever since your procedure? no  2.   Have you had an respiratory symptoms (SOB or cough) since your procedure? no  3.   Have you tested positive for COVID 19 since your procedure no  4.   Have you had any family members/close contacts diagnosed with the COVID 19 since your procedure?  no   If yes to any of these questions please route to Joylene John, RN and Erenest Rasher, RN

## 2019-06-10 DIAGNOSIS — M19012 Primary osteoarthritis, left shoulder: Secondary | ICD-10-CM | POA: Diagnosis not present

## 2019-06-10 DIAGNOSIS — M545 Low back pain: Secondary | ICD-10-CM | POA: Diagnosis not present

## 2019-06-10 DIAGNOSIS — M25561 Pain in right knee: Secondary | ICD-10-CM | POA: Diagnosis not present

## 2019-06-10 DIAGNOSIS — M25512 Pain in left shoulder: Secondary | ICD-10-CM | POA: Diagnosis not present

## 2019-06-11 ENCOUNTER — Other Ambulatory Visit: Payer: Self-pay | Admitting: Physician Assistant

## 2019-06-11 DIAGNOSIS — M25512 Pain in left shoulder: Secondary | ICD-10-CM

## 2019-06-11 DIAGNOSIS — M545 Low back pain: Secondary | ICD-10-CM | POA: Diagnosis not present

## 2019-06-14 ENCOUNTER — Encounter: Payer: Self-pay | Admitting: Gastroenterology

## 2019-06-15 DIAGNOSIS — Z940 Kidney transplant status: Secondary | ICD-10-CM | POA: Diagnosis not present

## 2019-06-16 ENCOUNTER — Other Ambulatory Visit: Payer: Self-pay | Admitting: Gastroenterology

## 2019-06-16 DIAGNOSIS — M545 Low back pain: Secondary | ICD-10-CM | POA: Diagnosis not present

## 2019-06-29 ENCOUNTER — Ambulatory Visit
Admission: RE | Admit: 2019-06-29 | Discharge: 2019-06-29 | Disposition: A | Discharge status: Home / Self Care | Payer: Medicare Other | Source: Ambulatory Visit | Attending: Physician Assistant | Admitting: Physician Assistant

## 2019-06-29 DIAGNOSIS — M19012 Primary osteoarthritis, left shoulder: Secondary | ICD-10-CM | POA: Diagnosis not present

## 2019-06-29 DIAGNOSIS — M25512 Pain in left shoulder: Secondary | ICD-10-CM

## 2019-07-01 DIAGNOSIS — R351 Nocturia: Secondary | ICD-10-CM | POA: Diagnosis not present

## 2019-07-01 DIAGNOSIS — Z940 Kidney transplant status: Secondary | ICD-10-CM | POA: Diagnosis not present

## 2019-07-01 DIAGNOSIS — D849 Immunodeficiency, unspecified: Secondary | ICD-10-CM | POA: Diagnosis not present

## 2019-07-01 DIAGNOSIS — I10 Essential (primary) hypertension: Secondary | ICD-10-CM | POA: Diagnosis not present

## 2019-07-01 DIAGNOSIS — N401 Enlarged prostate with lower urinary tract symptoms: Secondary | ICD-10-CM | POA: Diagnosis not present

## 2019-07-06 DIAGNOSIS — M545 Low back pain: Secondary | ICD-10-CM | POA: Diagnosis not present

## 2019-07-06 DIAGNOSIS — M25512 Pain in left shoulder: Secondary | ICD-10-CM | POA: Diagnosis not present

## 2019-07-06 DIAGNOSIS — M5136 Other intervertebral disc degeneration, lumbar region: Secondary | ICD-10-CM | POA: Diagnosis not present

## 2019-08-11 DIAGNOSIS — Z940 Kidney transplant status: Secondary | ICD-10-CM | POA: Diagnosis not present

## 2019-09-01 ENCOUNTER — Other Ambulatory Visit: Payer: Self-pay

## 2019-09-01 ENCOUNTER — Ambulatory Visit (HOSPITAL_COMMUNITY): Payer: Medicare Other | Attending: Cardiovascular Disease

## 2019-09-01 DIAGNOSIS — I350 Nonrheumatic aortic (valve) stenosis: Secondary | ICD-10-CM | POA: Diagnosis not present

## 2019-09-01 LAB — ECHOCARDIOGRAM COMPLETE
AR max vel: 1.62 cm2
AV Area VTI: 2 cm2
AV Area mean vel: 1.87 cm2
AV Mean grad: 8 mmHg
AV Peak grad: 16.6 mmHg
Ao pk vel: 2.04 m/s
Area-P 1/2: 3.31 cm2
P 1/2 time: 395 msec
S' Lateral: 2.4 cm

## 2019-09-17 DIAGNOSIS — Z940 Kidney transplant status: Secondary | ICD-10-CM | POA: Diagnosis not present

## 2019-09-29 DIAGNOSIS — C7A8 Other malignant neuroendocrine tumors: Secondary | ICD-10-CM | POA: Diagnosis not present

## 2019-09-29 DIAGNOSIS — M109 Gout, unspecified: Secondary | ICD-10-CM | POA: Diagnosis not present

## 2019-09-29 DIAGNOSIS — K635 Polyp of colon: Secondary | ICD-10-CM | POA: Diagnosis not present

## 2019-09-29 DIAGNOSIS — E781 Pure hyperglyceridemia: Secondary | ICD-10-CM | POA: Diagnosis not present

## 2019-09-29 DIAGNOSIS — K219 Gastro-esophageal reflux disease without esophagitis: Secondary | ICD-10-CM | POA: Diagnosis not present

## 2019-09-29 DIAGNOSIS — F438 Other reactions to severe stress: Secondary | ICD-10-CM | POA: Diagnosis not present

## 2019-09-29 DIAGNOSIS — Z940 Kidney transplant status: Secondary | ICD-10-CM | POA: Diagnosis not present

## 2019-09-29 DIAGNOSIS — G4733 Obstructive sleep apnea (adult) (pediatric): Secondary | ICD-10-CM | POA: Diagnosis not present

## 2019-09-29 DIAGNOSIS — E1122 Type 2 diabetes mellitus with diabetic chronic kidney disease: Secondary | ICD-10-CM | POA: Diagnosis not present

## 2019-09-29 DIAGNOSIS — E039 Hypothyroidism, unspecified: Secondary | ICD-10-CM | POA: Diagnosis not present

## 2019-09-29 DIAGNOSIS — I10 Essential (primary) hypertension: Secondary | ICD-10-CM | POA: Diagnosis not present

## 2019-10-22 DIAGNOSIS — Z940 Kidney transplant status: Secondary | ICD-10-CM | POA: Diagnosis not present

## 2019-10-28 DIAGNOSIS — Z7984 Long term (current) use of oral hypoglycemic drugs: Secondary | ICD-10-CM | POA: Diagnosis not present

## 2019-10-28 DIAGNOSIS — Z5189 Encounter for other specified aftercare: Secondary | ICD-10-CM | POA: Diagnosis not present

## 2019-10-28 DIAGNOSIS — M549 Dorsalgia, unspecified: Secondary | ICD-10-CM | POA: Diagnosis not present

## 2019-10-28 DIAGNOSIS — I120 Hypertensive chronic kidney disease with stage 5 chronic kidney disease or end stage renal disease: Secondary | ICD-10-CM | POA: Diagnosis not present

## 2019-10-28 DIAGNOSIS — E1122 Type 2 diabetes mellitus with diabetic chronic kidney disease: Secondary | ICD-10-CM | POA: Diagnosis not present

## 2019-10-28 DIAGNOSIS — E139 Other specified diabetes mellitus without complications: Secondary | ICD-10-CM | POA: Diagnosis not present

## 2019-10-28 DIAGNOSIS — E039 Hypothyroidism, unspecified: Secondary | ICD-10-CM | POA: Diagnosis not present

## 2019-10-28 DIAGNOSIS — T380X5A Adverse effect of glucocorticoids and synthetic analogues, initial encounter: Secondary | ICD-10-CM | POA: Diagnosis not present

## 2019-10-28 DIAGNOSIS — Z7952 Long term (current) use of systemic steroids: Secondary | ICD-10-CM | POA: Diagnosis not present

## 2019-10-28 DIAGNOSIS — Z23 Encounter for immunization: Secondary | ICD-10-CM | POA: Diagnosis not present

## 2019-10-28 DIAGNOSIS — Z87898 Personal history of other specified conditions: Secondary | ICD-10-CM | POA: Diagnosis not present

## 2019-10-28 DIAGNOSIS — E1165 Type 2 diabetes mellitus with hyperglycemia: Secondary | ICD-10-CM | POA: Diagnosis not present

## 2019-10-28 DIAGNOSIS — N186 End stage renal disease: Secondary | ICD-10-CM | POA: Diagnosis not present

## 2019-11-17 DIAGNOSIS — Z940 Kidney transplant status: Secondary | ICD-10-CM | POA: Diagnosis not present

## 2019-11-25 ENCOUNTER — Other Ambulatory Visit: Payer: Self-pay | Admitting: Urology

## 2019-12-13 DIAGNOSIS — Z7982 Long term (current) use of aspirin: Secondary | ICD-10-CM | POA: Diagnosis not present

## 2019-12-13 DIAGNOSIS — K3189 Other diseases of stomach and duodenum: Secondary | ICD-10-CM | POA: Diagnosis not present

## 2019-12-13 DIAGNOSIS — Z7952 Long term (current) use of systemic steroids: Secondary | ICD-10-CM | POA: Diagnosis not present

## 2019-12-13 DIAGNOSIS — I10 Essential (primary) hypertension: Secondary | ICD-10-CM | POA: Diagnosis not present

## 2019-12-13 DIAGNOSIS — Z882 Allergy status to sulfonamides status: Secondary | ICD-10-CM | POA: Diagnosis not present

## 2019-12-13 DIAGNOSIS — Z79899 Other long term (current) drug therapy: Secondary | ICD-10-CM | POA: Diagnosis not present

## 2019-12-13 DIAGNOSIS — Z20822 Contact with and (suspected) exposure to covid-19: Secondary | ICD-10-CM | POA: Diagnosis not present

## 2019-12-13 DIAGNOSIS — E119 Type 2 diabetes mellitus without complications: Secondary | ICD-10-CM | POA: Diagnosis not present

## 2019-12-13 DIAGNOSIS — Z7984 Long term (current) use of oral hypoglycemic drugs: Secondary | ICD-10-CM | POA: Diagnosis not present

## 2019-12-13 DIAGNOSIS — E039 Hypothyroidism, unspecified: Secondary | ICD-10-CM | POA: Diagnosis not present

## 2019-12-13 DIAGNOSIS — K219 Gastro-esophageal reflux disease without esophagitis: Secondary | ICD-10-CM | POA: Diagnosis not present

## 2019-12-13 DIAGNOSIS — Z940 Kidney transplant status: Secondary | ICD-10-CM | POA: Diagnosis not present

## 2019-12-13 DIAGNOSIS — D3A8 Other benign neuroendocrine tumors: Secondary | ICD-10-CM | POA: Diagnosis not present

## 2019-12-16 DIAGNOSIS — Z940 Kidney transplant status: Secondary | ICD-10-CM | POA: Diagnosis not present

## 2019-12-17 DIAGNOSIS — Z961 Presence of intraocular lens: Secondary | ICD-10-CM | POA: Diagnosis not present

## 2019-12-17 DIAGNOSIS — H53022 Refractive amblyopia, left eye: Secondary | ICD-10-CM | POA: Diagnosis not present

## 2019-12-17 DIAGNOSIS — H43813 Vitreous degeneration, bilateral: Secondary | ICD-10-CM | POA: Diagnosis not present

## 2019-12-17 DIAGNOSIS — E119 Type 2 diabetes mellitus without complications: Secondary | ICD-10-CM | POA: Diagnosis not present

## 2019-12-17 DIAGNOSIS — Z7984 Long term (current) use of oral hypoglycemic drugs: Secondary | ICD-10-CM | POA: Diagnosis not present

## 2020-01-03 DIAGNOSIS — I120 Hypertensive chronic kidney disease with stage 5 chronic kidney disease or end stage renal disease: Secondary | ICD-10-CM | POA: Diagnosis not present

## 2020-01-03 DIAGNOSIS — M25512 Pain in left shoulder: Secondary | ICD-10-CM | POA: Diagnosis not present

## 2020-01-03 DIAGNOSIS — D84821 Immunodeficiency due to drugs: Secondary | ICD-10-CM | POA: Diagnosis not present

## 2020-01-03 DIAGNOSIS — M5430 Sciatica, unspecified side: Secondary | ICD-10-CM | POA: Diagnosis not present

## 2020-01-03 DIAGNOSIS — E039 Hypothyroidism, unspecified: Secondary | ICD-10-CM | POA: Diagnosis not present

## 2020-01-03 DIAGNOSIS — N186 End stage renal disease: Secondary | ICD-10-CM | POA: Diagnosis not present

## 2020-01-03 DIAGNOSIS — Z7952 Long term (current) use of systemic steroids: Secondary | ICD-10-CM | POA: Diagnosis not present

## 2020-01-03 DIAGNOSIS — Z4822 Encounter for aftercare following kidney transplant: Secondary | ICD-10-CM | POA: Diagnosis not present

## 2020-01-03 DIAGNOSIS — Z79899 Other long term (current) drug therapy: Secondary | ICD-10-CM | POA: Diagnosis not present

## 2020-01-03 DIAGNOSIS — E1122 Type 2 diabetes mellitus with diabetic chronic kidney disease: Secondary | ICD-10-CM | POA: Diagnosis not present

## 2020-01-03 DIAGNOSIS — Z940 Kidney transplant status: Secondary | ICD-10-CM | POA: Diagnosis not present

## 2020-01-13 DIAGNOSIS — Z9483 Pancreas transplant status: Secondary | ICD-10-CM | POA: Diagnosis not present

## 2020-01-13 DIAGNOSIS — Z940 Kidney transplant status: Secondary | ICD-10-CM | POA: Diagnosis not present

## 2020-01-13 DIAGNOSIS — B349 Viral infection, unspecified: Secondary | ICD-10-CM | POA: Diagnosis not present

## 2020-01-13 DIAGNOSIS — Z5181 Encounter for therapeutic drug level monitoring: Secondary | ICD-10-CM | POA: Diagnosis not present

## 2020-01-18 DIAGNOSIS — M545 Low back pain, unspecified: Secondary | ICD-10-CM | POA: Diagnosis not present

## 2020-01-18 DIAGNOSIS — G8929 Other chronic pain: Secondary | ICD-10-CM | POA: Diagnosis not present

## 2020-01-18 DIAGNOSIS — M19012 Primary osteoarthritis, left shoulder: Secondary | ICD-10-CM | POA: Diagnosis not present

## 2020-01-18 DIAGNOSIS — M25512 Pain in left shoulder: Secondary | ICD-10-CM | POA: Diagnosis not present

## 2020-01-24 DIAGNOSIS — M19012 Primary osteoarthritis, left shoulder: Secondary | ICD-10-CM | POA: Diagnosis not present

## 2020-01-25 DIAGNOSIS — Z940 Kidney transplant status: Secondary | ICD-10-CM | POA: Diagnosis not present

## 2020-01-26 DIAGNOSIS — E039 Hypothyroidism, unspecified: Secondary | ICD-10-CM | POA: Diagnosis not present

## 2020-01-26 DIAGNOSIS — M109 Gout, unspecified: Secondary | ICD-10-CM | POA: Diagnosis not present

## 2020-01-26 DIAGNOSIS — Z940 Kidney transplant status: Secondary | ICD-10-CM | POA: Diagnosis not present

## 2020-01-26 DIAGNOSIS — I10 Essential (primary) hypertension: Secondary | ICD-10-CM | POA: Diagnosis not present

## 2020-01-26 DIAGNOSIS — E781 Pure hyperglyceridemia: Secondary | ICD-10-CM | POA: Diagnosis not present

## 2020-01-26 DIAGNOSIS — G4733 Obstructive sleep apnea (adult) (pediatric): Secondary | ICD-10-CM | POA: Diagnosis not present

## 2020-01-26 DIAGNOSIS — C7A8 Other malignant neuroendocrine tumors: Secondary | ICD-10-CM | POA: Diagnosis not present

## 2020-01-26 DIAGNOSIS — M5432 Sciatica, left side: Secondary | ICD-10-CM | POA: Diagnosis not present

## 2020-01-26 DIAGNOSIS — K635 Polyp of colon: Secondary | ICD-10-CM | POA: Diagnosis not present

## 2020-01-26 DIAGNOSIS — F438 Other reactions to severe stress: Secondary | ICD-10-CM | POA: Diagnosis not present

## 2020-01-26 DIAGNOSIS — K219 Gastro-esophageal reflux disease without esophagitis: Secondary | ICD-10-CM | POA: Diagnosis not present

## 2020-01-26 DIAGNOSIS — E1122 Type 2 diabetes mellitus with diabetic chronic kidney disease: Secondary | ICD-10-CM | POA: Diagnosis not present

## 2020-02-28 DIAGNOSIS — Z6826 Body mass index (BMI) 26.0-26.9, adult: Secondary | ICD-10-CM | POA: Diagnosis not present

## 2020-02-28 DIAGNOSIS — M5416 Radiculopathy, lumbar region: Secondary | ICD-10-CM | POA: Diagnosis not present

## 2020-02-28 DIAGNOSIS — R030 Elevated blood-pressure reading, without diagnosis of hypertension: Secondary | ICD-10-CM | POA: Diagnosis not present

## 2020-03-07 DIAGNOSIS — Z940 Kidney transplant status: Secondary | ICD-10-CM | POA: Diagnosis not present

## 2020-03-16 DIAGNOSIS — M5416 Radiculopathy, lumbar region: Secondary | ICD-10-CM | POA: Diagnosis not present

## 2020-03-16 DIAGNOSIS — M545 Low back pain, unspecified: Secondary | ICD-10-CM | POA: Diagnosis not present

## 2020-03-28 DIAGNOSIS — M19012 Primary osteoarthritis, left shoulder: Secondary | ICD-10-CM | POA: Diagnosis not present

## 2020-03-28 DIAGNOSIS — Z01818 Encounter for other preprocedural examination: Secondary | ICD-10-CM | POA: Diagnosis not present

## 2020-03-29 DIAGNOSIS — M47816 Spondylosis without myelopathy or radiculopathy, lumbar region: Secondary | ICD-10-CM | POA: Diagnosis not present

## 2020-03-29 DIAGNOSIS — R030 Elevated blood-pressure reading, without diagnosis of hypertension: Secondary | ICD-10-CM | POA: Diagnosis not present

## 2020-03-29 DIAGNOSIS — Z6826 Body mass index (BMI) 26.0-26.9, adult: Secondary | ICD-10-CM | POA: Diagnosis not present

## 2020-03-29 DIAGNOSIS — M5416 Radiculopathy, lumbar region: Secondary | ICD-10-CM | POA: Diagnosis not present

## 2020-03-29 DIAGNOSIS — M8580 Other specified disorders of bone density and structure, unspecified site: Secondary | ICD-10-CM | POA: Diagnosis not present

## 2020-04-04 DIAGNOSIS — Z940 Kidney transplant status: Secondary | ICD-10-CM | POA: Diagnosis not present

## 2020-04-07 ENCOUNTER — Other Ambulatory Visit: Payer: Self-pay | Admitting: Neurosurgery

## 2020-04-07 DIAGNOSIS — M8580 Other specified disorders of bone density and structure, unspecified site: Secondary | ICD-10-CM

## 2020-04-28 DIAGNOSIS — N5203 Combined arterial insufficiency and corporo-venous occlusive erectile dysfunction: Secondary | ICD-10-CM | POA: Diagnosis not present

## 2020-04-28 DIAGNOSIS — Z940 Kidney transplant status: Secondary | ICD-10-CM | POA: Diagnosis not present

## 2020-04-28 DIAGNOSIS — R351 Nocturia: Secondary | ICD-10-CM | POA: Diagnosis not present

## 2020-04-28 DIAGNOSIS — N401 Enlarged prostate with lower urinary tract symptoms: Secondary | ICD-10-CM | POA: Diagnosis not present

## 2020-05-01 DIAGNOSIS — Z940 Kidney transplant status: Secondary | ICD-10-CM | POA: Diagnosis not present

## 2020-06-01 DIAGNOSIS — Z940 Kidney transplant status: Secondary | ICD-10-CM | POA: Diagnosis not present

## 2020-06-23 DIAGNOSIS — Z0000 Encounter for general adult medical examination without abnormal findings: Secondary | ICD-10-CM | POA: Diagnosis not present

## 2020-06-23 DIAGNOSIS — M109 Gout, unspecified: Secondary | ICD-10-CM | POA: Diagnosis not present

## 2020-06-23 DIAGNOSIS — Z125 Encounter for screening for malignant neoplasm of prostate: Secondary | ICD-10-CM | POA: Diagnosis not present

## 2020-06-23 DIAGNOSIS — E781 Pure hyperglyceridemia: Secondary | ICD-10-CM | POA: Diagnosis not present

## 2020-06-23 DIAGNOSIS — I10 Essential (primary) hypertension: Secondary | ICD-10-CM | POA: Diagnosis not present

## 2020-06-23 DIAGNOSIS — E039 Hypothyroidism, unspecified: Secondary | ICD-10-CM | POA: Diagnosis not present

## 2020-06-23 DIAGNOSIS — E1122 Type 2 diabetes mellitus with diabetic chronic kidney disease: Secondary | ICD-10-CM | POA: Diagnosis not present

## 2020-06-26 DIAGNOSIS — L280 Lichen simplex chronicus: Secondary | ICD-10-CM | POA: Diagnosis not present

## 2020-06-26 DIAGNOSIS — L821 Other seborrheic keratosis: Secondary | ICD-10-CM | POA: Diagnosis not present

## 2020-06-27 DIAGNOSIS — Z940 Kidney transplant status: Secondary | ICD-10-CM | POA: Diagnosis not present

## 2020-06-30 DIAGNOSIS — H35342 Macular cyst, hole, or pseudohole, left eye: Secondary | ICD-10-CM | POA: Diagnosis not present

## 2020-06-30 DIAGNOSIS — Z940 Kidney transplant status: Secondary | ICD-10-CM | POA: Diagnosis not present

## 2020-06-30 DIAGNOSIS — Z1212 Encounter for screening for malignant neoplasm of rectum: Secondary | ICD-10-CM | POA: Diagnosis not present

## 2020-06-30 DIAGNOSIS — H43813 Vitreous degeneration, bilateral: Secondary | ICD-10-CM | POA: Diagnosis not present

## 2020-06-30 DIAGNOSIS — E119 Type 2 diabetes mellitus without complications: Secondary | ICD-10-CM | POA: Diagnosis not present

## 2020-06-30 DIAGNOSIS — E1122 Type 2 diabetes mellitus with diabetic chronic kidney disease: Secondary | ICD-10-CM | POA: Diagnosis not present

## 2020-06-30 DIAGNOSIS — Z0000 Encounter for general adult medical examination without abnormal findings: Secondary | ICD-10-CM | POA: Diagnosis not present

## 2020-06-30 DIAGNOSIS — E781 Pure hyperglyceridemia: Secondary | ICD-10-CM | POA: Diagnosis not present

## 2020-06-30 DIAGNOSIS — E039 Hypothyroidism, unspecified: Secondary | ICD-10-CM | POA: Diagnosis not present

## 2020-06-30 DIAGNOSIS — Z7984 Long term (current) use of oral hypoglycemic drugs: Secondary | ICD-10-CM | POA: Diagnosis not present

## 2020-06-30 DIAGNOSIS — C7A8 Other malignant neuroendocrine tumors: Secondary | ICD-10-CM | POA: Diagnosis not present

## 2020-06-30 DIAGNOSIS — F438 Other reactions to severe stress: Secondary | ICD-10-CM | POA: Diagnosis not present

## 2020-06-30 DIAGNOSIS — H53022 Refractive amblyopia, left eye: Secondary | ICD-10-CM | POA: Diagnosis not present

## 2020-06-30 DIAGNOSIS — M5432 Sciatica, left side: Secondary | ICD-10-CM | POA: Diagnosis not present

## 2020-06-30 DIAGNOSIS — K219 Gastro-esophageal reflux disease without esophagitis: Secondary | ICD-10-CM | POA: Diagnosis not present

## 2020-06-30 DIAGNOSIS — G4733 Obstructive sleep apnea (adult) (pediatric): Secondary | ICD-10-CM | POA: Diagnosis not present

## 2020-06-30 DIAGNOSIS — Z961 Presence of intraocular lens: Secondary | ICD-10-CM | POA: Diagnosis not present

## 2020-06-30 DIAGNOSIS — I10 Essential (primary) hypertension: Secondary | ICD-10-CM | POA: Diagnosis not present

## 2020-06-30 DIAGNOSIS — K635 Polyp of colon: Secondary | ICD-10-CM | POA: Diagnosis not present

## 2020-07-04 DIAGNOSIS — Z79899 Other long term (current) drug therapy: Secondary | ICD-10-CM | POA: Diagnosis not present

## 2020-07-04 DIAGNOSIS — Z940 Kidney transplant status: Secondary | ICD-10-CM | POA: Diagnosis not present

## 2020-07-04 DIAGNOSIS — Z7185 Encounter for immunization safety counseling: Secondary | ICD-10-CM | POA: Diagnosis not present

## 2020-07-12 ENCOUNTER — Telehealth: Payer: Self-pay

## 2020-07-12 NOTE — Telephone Encounter (Signed)
Left message for patient to call back. Need to schedule follow up appointment for patient to see Dr. Johnsie Cancel.

## 2020-08-07 DIAGNOSIS — Z940 Kidney transplant status: Secondary | ICD-10-CM | POA: Diagnosis not present

## 2020-08-25 NOTE — Progress Notes (Signed)
Cardiology Office Note   Date:  08/25/2020   ID:  Colin Mcfarland, DOB 1965/11/24, MRN [id number]  PCP:  [name], MD  Cardiologist:   Jenkins Rouge, MD   No chief complaint on file.      History of Present Illness: Colin Mcfarland is a 55 y.o. male who presents for consultation for CHF.  Last seen over 3 years ago Referred by Avva Ravisanka. Seen by me 7 years ago for bradycardia and near syncope during LUE fistula placement. History of CRF on dialysis Was on labatolol at time F/u ETT normal  Echo 04/14/13 EF normal 60-65% normal wall thickness and grade one diastolic Updated TTE 9/50/93 showed EF 60-65% with mild to moderate AR and mild MR   Dr Azzie Roup is his renal doctor Since  last saw him he has had renal transplant at [facility] 01/2018 on prograf/cellcept/prednisone ESRD was from IgA nephropathy   No cardiac complaints Transplant doing well still on prednisone  Has had COVID vaccine but no antibodies   Working at [employer]    Past Medical History:  Diagnosis Date   Anemia    Arthritis    Chondromalacia, 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/2015   Chronic kidney disease    Eczema    hx: of   GERD (gastroesophageal reflux disease)    Gout    Hypertension    Hypothyroidism    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   Osteoarthritis of both feet 11/22/2015   Osteoarthritis of both knees 11/22/2015   Pneumonia    Shoulder impingement, left 11/22/2015    Past Surgical History:  Procedure Laterality Date   AV FISTULA PLACEMENT Left 03/18/2013   Procedure: ARTERIOVENOUS (AV) FISTULA CREATION- BRACHIOCEPHALIC;  Surgeon: Mal Misty, MD;  Location: [facility];  Service: Vascular;  Laterality: Left;   CATARACT EXTRACTION     01/2018 left 07/2018 right    COLONOSCOPY  11/25/2014   Colon polyp, staus post polypectomy. Internal Hemorrhoids. Otherwise normal colonoscopy to terminal ileum   HEMORRHOID SURGERY     KIDNEY TRANSPLANT  2020   RENAL BIOPSY     SHOULDER SURGERY       Current Outpatient Medications   Medication Sig Dispense Refill   amoxicillin (AMOXIL) 500 MG capsule Take by mouth. With Dental procedures     aspirin EC 81 MG tablet Take 81 mg by mouth daily.      Blood Glucose Monitoring Suppl (ONETOUCH VERIO) w/Device KIT      calcium carbonate (TUMS - DOSED IN MG ELEMENTAL CALCIUM) 500 MG chewable tablet Chew 1 tablet by mouth as needed.      insulin regular (NOVOLIN R,HUMULIN R) 100 units/mL injection Inject into the skin as needed. As needed     Insulin Syringe-Needle U-100 (BD VEO INSULIN SYRINGE U/F) 31G X 15/64" 0.3 ML MISC Use 1 Syringe 3 (three) times daily before meals     JANUVIA 25 MG tablet Take 25 mg by mouth daily.     Lancets (ONETOUCH DELICA PLUS [id number]) MISC      lansoprazole (PREVACID) 30 MG capsule TAKE 1 CAPSULE BY MOUTH  DAILY AT 12 NOON. 90 capsule 3   levothyroxine (SYNTHROID, LEVOTHROID) 125 MCG tablet Take 125 mcg by mouth daily.     mycophenolate (CELLCEPT) 250 MG capsule Take 1,000 mg by mouth 2 (two) times daily.      ONETOUCH VERIO test strip      polyethylene glycol (MIRALAX / GLYCOLAX) 17 g packet Take 17 g by mouth daily.  predniSONE (DELTASONE) 5 MG tablet Take 4 tablets x2 days, 3 tablets x2 days, 2 tablets x2 days, 1 tablet x2 days. (Patient taking differently: Take 5 mg by mouth daily. Take 4 tablets x2 days, 3 tablets x2 days, 2 tablets x2 days, 1 tablet x2 days.) 20 tablet 0   rosuvastatin (CRESTOR) 5 MG tablet rosuvastatin 5 mg tablet     tacrolimus (PROGRAF) 0.5 MG capsule Take 0.5 mg by mouth 2 (two) times daily.     tamsulosin (FLOMAX) 0.4 MG CAPS capsule TAKE 1 CAPSULE BY MOUTH  EVERY NIGHT AT BEDTIME 90 capsule 3   No current facility-administered medications for this visit.    Allergies:   Blue dyes (parenteral), Uloric [febuxostat], Sevelamer, and Sulfa antibiotics    Social History:  The patient  reports that he has never smoked. He has never used smokeless tobacco. He reports that he does not drink alcohol and does not use drugs.    Family History:  The patient's family history includes Cancer in his mother; Diabetes in his brother and father; Hypertension in his father.    ROS:  Please see the history of present illness.   Otherwise, review of systems are positive for none.   All other systems are reviewed and negative.    PHYSICAL EXAM: VS:  There were no vitals taken for this visit. , BMI There is no height or weight on file to calculate BMI. Affect appropriate Healthy:  appears stated age 77: normal Neck supple with no adenopathy JVP normal no bruits no thyromegaly Lungs clear with no wheezing and good diaphragmatic motion Heart:  S1/S2 AV sclerosis  murmur, no rub, gallop or click PMI normal Abdomen: benighn, post renal transplant  Distal pulses intact with no bruits No edema Neuro non-focal Skin warm and dry No muscular weakness Huge fistula in LUE    EKG:  08/28/2020 SR rate 69 voltage LVH    Recent Labs: No results found for requested labs within last 8760 hours.    Lipid Panel No results found for: CHOL, TRIG, HDL, CHOLHDL, VLDL, LDLCALC, LDLDIRECT    Wt Readings from Last 3 Encounters:  06/07/19 74.4 kg  05/13/19 74.5 kg  04/20/18 71.7 kg      Other studies Reviewed: Additional studies/ records that were reviewed today include: Notes from primary labs ECG 2015 along with old cardiology consult note 2015 ETT and echo .    ASSESSMENT AND PLAN:  1.  CHF:  related to renal failure and volume overload improved  2. HTN:  Well controlled.  Continue current medications and low sodium Dash type diet.   3. CRF:  post transplant immunocompromised f/u nephrology  4. AR/MR:  no change in murmurs f/u echo ordered    Current medicines are reviewed at length with the patient today.  The patient does not have concerns regarding medicines.  The following changes have been made:  no change  Labs/ tests ordered today include: TTE   No orders of the defined types were placed in this  encounter.    Disposition:   FU with cardiology in a year if valves stable     Signed, Jenkins Rouge, MD  08/25/2020 8:06 AM    [facility] [address], [place], [place]  [address] Phone: [phone]; Fax: [phone]

## 2020-08-28 ENCOUNTER — Other Ambulatory Visit: Payer: Self-pay

## 2020-08-28 ENCOUNTER — Ambulatory Visit (INDEPENDENT_AMBULATORY_CARE_PROVIDER_SITE_OTHER): Payer: Medicare Other | Admitting: Cardiovascular Disease

## 2020-08-28 ENCOUNTER — Encounter: Payer: Self-pay | Admitting: Cardiovascular Disease

## 2020-08-28 ENCOUNTER — Other Ambulatory Visit: Payer: Self-pay | Admitting: Gastroenterology

## 2020-08-28 VITALS — BP 110/64 | HR 69 | Ht 68.0 in | Wt 163.0 lb

## 2020-08-28 DIAGNOSIS — Z940 Kidney transplant status: Secondary | ICD-10-CM | POA: Diagnosis not present

## 2020-08-28 DIAGNOSIS — I10 Essential (primary) hypertension: Secondary | ICD-10-CM

## 2020-08-28 DIAGNOSIS — I351 Nonrheumatic aortic (valve) insufficiency: Secondary | ICD-10-CM | POA: Diagnosis not present

## 2020-08-28 NOTE — Patient Instructions (Signed)
Medication Instructions:  *If you need a refill on your cardiac medications before your next appointment, please call your pharmacy*  Lab Work: If you have labs (blood work) drawn today and your tests are completely normal, you will receive your results only by: [place] (if you have MyChart) OR A paper copy in the mail If you have any lab test that is abnormal or we need to change your treatment, we will call you to review the results.  Testing/Procedures: Your physician has requested that you have an echocardiogram. Echocardiography is a painless test that uses sound waves to create images of your heart. It provides your doctor with information about the size and shape of your heart and how well your heart's chambers and valves are working. This procedure takes approximately one hour. There are no restrictions for this procedure.  Follow-Up: At [facility], you and your health needs are our priority.  As part of our continuing mission to provide you with exceptional heart care, we have created designated Provider Care Teams.  These Care Teams include your primary Cardiologist (physician) and Advanced Practice Providers (APPs -  Physician Assistants and Nurse Practitioners) who all work together to provide you with the care you need, when you need it.  We recommend signing up for the patient portal called "MyChart".  Sign up information is provided on this After Visit Summary.  MyChart is used to connect with patients for Virtual Visits (Telemedicine).  Patients are able to view lab/test results, encounter notes, upcoming appointments, etc.  Non-urgent messages can be sent to your provider as well.   To learn more about what you can do with MyChart, go to [email or web address].    Your next appointment:   1 year(s)  The format for your next appointment:   In Person  Provider:   You may see Dr. Johnsie Cancel or one of the following Advanced Practice Providers on your designated Care  Team:   Cecilie Kicks, NP

## 2020-09-07 DIAGNOSIS — D849 Immunodeficiency, unspecified: Secondary | ICD-10-CM | POA: Diagnosis not present

## 2020-09-07 DIAGNOSIS — Z940 Kidney transplant status: Secondary | ICD-10-CM | POA: Diagnosis not present

## 2020-09-15 ENCOUNTER — Other Ambulatory Visit: Payer: Self-pay

## 2020-09-15 ENCOUNTER — Encounter (HOSPITAL_COMMUNITY): Payer: Self-pay | Admitting: *Deleted

## 2020-09-15 ENCOUNTER — Ambulatory Visit (HOSPITAL_COMMUNITY): Payer: Medicare Other | Attending: Cardiology

## 2020-09-15 DIAGNOSIS — I351 Nonrheumatic aortic (valve) insufficiency: Secondary | ICD-10-CM | POA: Diagnosis not present

## 2020-09-15 LAB — ECHOCARDIOGRAM COMPLETE
AV Mean grad: 4 mmHg
AV Peak grad: 7.2 mmHg
Ao pk vel: 1.34 m/s
Area-P 1/2: 3.56 cm2
P 1/2 time: 642 msec
S' Lateral: 2.3 cm

## 2020-09-20 ENCOUNTER — Telehealth: Payer: Self-pay

## 2020-09-20 DIAGNOSIS — I351 Nonrheumatic aortic (valve) insufficiency: Secondary | ICD-10-CM

## 2020-09-20 NOTE — Telephone Encounter (Signed)
-----   Message from Josue Hector, MD sent at 09/16/2020  2:29 PM EDT ----- Moderate AR stable f/u echo in a year

## 2020-09-20 NOTE — Telephone Encounter (Signed)
Will place order for echo for 1 year.

## 2020-10-04 DIAGNOSIS — Z940 Kidney transplant status: Secondary | ICD-10-CM | POA: Diagnosis not present

## 2020-10-04 DIAGNOSIS — D849 Immunodeficiency, unspecified: Secondary | ICD-10-CM | POA: Diagnosis not present

## 2020-10-10 ENCOUNTER — Ambulatory Visit
Admission: RE | Admit: 2020-10-10 | Discharge: 2020-10-10 | Disposition: A | Discharge status: Home / Self Care | Payer: 59 | Source: Ambulatory Visit | Attending: Neurosurgery | Admitting: Neurosurgery

## 2020-10-10 ENCOUNTER — Other Ambulatory Visit: Payer: Self-pay

## 2020-10-10 ENCOUNTER — Encounter (HOSPITAL_COMMUNITY): Payer: Self-pay | Admitting: *Deleted

## 2020-10-10 DIAGNOSIS — M8580 Other specified disorders of bone density and structure, unspecified site: Secondary | ICD-10-CM

## 2020-10-29 ENCOUNTER — Other Ambulatory Visit: Payer: Self-pay | Admitting: Gastroenterology

## 2020-10-30 DIAGNOSIS — Z5189 Encounter for other specified aftercare: Secondary | ICD-10-CM | POA: Diagnosis not present

## 2020-10-30 DIAGNOSIS — E139 Other specified diabetes mellitus without complications: Secondary | ICD-10-CM | POA: Diagnosis not present

## 2020-10-30 DIAGNOSIS — Z87898 Personal history of other specified conditions: Secondary | ICD-10-CM | POA: Diagnosis not present

## 2020-10-30 DIAGNOSIS — Z23 Encounter for immunization: Secondary | ICD-10-CM | POA: Diagnosis not present

## 2020-11-09 DIAGNOSIS — Z940 Kidney transplant status: Secondary | ICD-10-CM | POA: Diagnosis not present

## 2020-11-09 DIAGNOSIS — Z4822 Encounter for aftercare following kidney transplant: Secondary | ICD-10-CM | POA: Diagnosis not present

## 2020-11-16 DIAGNOSIS — M5416 Radiculopathy, lumbar region: Secondary | ICD-10-CM | POA: Diagnosis not present

## 2020-12-20 DIAGNOSIS — N390 Urinary tract infection, site not specified: Secondary | ICD-10-CM | POA: Diagnosis not present

## 2020-12-20 DIAGNOSIS — Z940 Kidney transplant status: Secondary | ICD-10-CM | POA: Diagnosis not present

## 2021-01-02 DIAGNOSIS — Z940 Kidney transplant status: Secondary | ICD-10-CM | POA: Diagnosis not present

## 2021-01-02 DIAGNOSIS — E213 Hyperparathyroidism, unspecified: Secondary | ICD-10-CM | POA: Diagnosis not present

## 2021-01-02 DIAGNOSIS — E039 Hypothyroidism, unspecified: Secondary | ICD-10-CM | POA: Diagnosis not present

## 2021-01-02 DIAGNOSIS — D849 Immunodeficiency, unspecified: Secondary | ICD-10-CM | POA: Diagnosis not present

## 2021-01-02 DIAGNOSIS — F419 Anxiety disorder, unspecified: Secondary | ICD-10-CM | POA: Diagnosis not present

## 2021-01-02 DIAGNOSIS — Z23 Encounter for immunization: Secondary | ICD-10-CM | POA: Diagnosis not present

## 2021-01-02 DIAGNOSIS — E785 Hyperlipidemia, unspecified: Secondary | ICD-10-CM | POA: Diagnosis not present

## 2021-01-04 DIAGNOSIS — E781 Pure hyperglyceridemia: Secondary | ICD-10-CM | POA: Diagnosis not present

## 2021-01-04 DIAGNOSIS — F4389 Other reactions to severe stress: Secondary | ICD-10-CM | POA: Diagnosis not present

## 2021-01-04 DIAGNOSIS — C7A8 Other malignant neuroendocrine tumors: Secondary | ICD-10-CM | POA: Diagnosis not present

## 2021-01-04 DIAGNOSIS — I10 Essential (primary) hypertension: Secondary | ICD-10-CM | POA: Diagnosis not present

## 2021-01-04 DIAGNOSIS — E039 Hypothyroidism, unspecified: Secondary | ICD-10-CM | POA: Diagnosis not present

## 2021-01-04 DIAGNOSIS — G4733 Obstructive sleep apnea (adult) (pediatric): Secondary | ICD-10-CM | POA: Diagnosis not present

## 2021-01-04 DIAGNOSIS — M109 Gout, unspecified: Secondary | ICD-10-CM | POA: Diagnosis not present

## 2021-01-04 DIAGNOSIS — K219 Gastro-esophageal reflux disease without esophagitis: Secondary | ICD-10-CM | POA: Diagnosis not present

## 2021-01-04 DIAGNOSIS — K635 Polyp of colon: Secondary | ICD-10-CM | POA: Diagnosis not present

## 2021-01-04 DIAGNOSIS — Z940 Kidney transplant status: Secondary | ICD-10-CM | POA: Diagnosis not present

## 2021-01-04 DIAGNOSIS — E1122 Type 2 diabetes mellitus with diabetic chronic kidney disease: Secondary | ICD-10-CM | POA: Diagnosis not present

## 2021-01-04 DIAGNOSIS — M5432 Sciatica, left side: Secondary | ICD-10-CM | POA: Diagnosis not present

## 2021-01-11 DIAGNOSIS — D1801 Hemangioma of skin and subcutaneous tissue: Secondary | ICD-10-CM | POA: Diagnosis not present

## 2021-01-11 DIAGNOSIS — L738 Other specified follicular disorders: Secondary | ICD-10-CM | POA: Diagnosis not present

## 2021-01-11 DIAGNOSIS — L2089 Other atopic dermatitis: Secondary | ICD-10-CM | POA: Diagnosis not present

## 2021-01-11 DIAGNOSIS — L819 Disorder of pigmentation, unspecified: Secondary | ICD-10-CM | POA: Diagnosis not present

## 2021-01-11 DIAGNOSIS — L821 Other seborrheic keratosis: Secondary | ICD-10-CM | POA: Diagnosis not present

## 2021-01-13 ENCOUNTER — Other Ambulatory Visit: Payer: Self-pay | Admitting: Urology

## 2021-01-22 DIAGNOSIS — E030 Congenital hypothyroidism with diffuse goiter: Secondary | ICD-10-CM | POA: Diagnosis not present

## 2021-01-22 DIAGNOSIS — E1122 Type 2 diabetes mellitus with diabetic chronic kidney disease: Secondary | ICD-10-CM | POA: Diagnosis not present

## 2021-01-22 DIAGNOSIS — E781 Pure hyperglyceridemia: Secondary | ICD-10-CM | POA: Diagnosis not present

## 2021-01-23 DIAGNOSIS — Z940 Kidney transplant status: Secondary | ICD-10-CM | POA: Diagnosis not present

## 2021-02-12 DIAGNOSIS — H35342 Macular cyst, hole, or pseudohole, left eye: Secondary | ICD-10-CM | POA: Diagnosis not present

## 2021-02-12 DIAGNOSIS — Z961 Presence of intraocular lens: Secondary | ICD-10-CM | POA: Diagnosis not present

## 2021-02-12 DIAGNOSIS — E119 Type 2 diabetes mellitus without complications: Secondary | ICD-10-CM | POA: Diagnosis not present

## 2021-02-12 DIAGNOSIS — H53022 Refractive amblyopia, left eye: Secondary | ICD-10-CM | POA: Diagnosis not present

## 2021-02-12 DIAGNOSIS — Z7984 Long term (current) use of oral hypoglycemic drugs: Secondary | ICD-10-CM | POA: Diagnosis not present

## 2021-02-12 DIAGNOSIS — H43813 Vitreous degeneration, bilateral: Secondary | ICD-10-CM | POA: Diagnosis not present

## 2021-02-19 ENCOUNTER — Other Ambulatory Visit: Payer: Self-pay

## 2021-02-19 ENCOUNTER — Ambulatory Visit (INDEPENDENT_AMBULATORY_CARE_PROVIDER_SITE_OTHER): Payer: 59 | Admitting: Orthopaedic Surgery

## 2021-02-19 ENCOUNTER — Ambulatory Visit: Payer: Medicare Other

## 2021-02-19 ENCOUNTER — Encounter (HOSPITAL_COMMUNITY): Payer: Self-pay | Admitting: *Deleted

## 2021-02-19 DIAGNOSIS — G8929 Other chronic pain: Secondary | ICD-10-CM | POA: Diagnosis not present

## 2021-02-19 DIAGNOSIS — M25511 Pain in right shoulder: Secondary | ICD-10-CM

## 2021-02-19 MED ORDER — LIDOCAINE HCL 1 % IJ SOLN
3.0000 mL | INTRAMUSCULAR | Status: AC | PRN
  Administered 2021-02-19: 3 mL

## 2021-02-19 MED ORDER — METHYLPREDNISOLONE ACETATE 40 MG/ML IJ SUSP
40.0000 mg | INTRAMUSCULAR | Status: AC | PRN
  Administered 2021-02-19: 40 mg via INTRA_ARTICULAR

## 2021-02-19 MED ORDER — HYDROCODONE-ACETAMINOPHEN 5-325 MG PO TABS
1.0000 | ORAL_TABLET | Freq: Three times a day (TID) | ORAL | 0 refills | Status: DC | PRN
Start: 2021-02-19 — End: 2023-01-27

## 2021-02-19 NOTE — Progress Notes (Signed)
Office Visit Note   Patient: Colin Mcfarland           Date of Birth: 1965-01-23           MRN: [id number] Visit Date: 02/19/2021              Requested by: [name], MD [address] [place],  [place] [address] PCP: [name], MD   Assessment & Plan: Visit Diagnoses:  1. Chronic right shoulder pain     Plan: I did recommend steroid injection of the right shoulder subacromial outlet and he agreed to this and tolerated it well.  He will watch his blood glucose closely.  I will send in a small amount of hydrocodone which could also help.  All questions and concerns were answered and addressed.  Follow-up will be as needed.  Follow-Up Instructions: Return if symptoms worsen or fail to improve.   Orders:  Orders Placed This Encounter  Procedures   Large Joint Inj   XR Shoulder Right   Meds ordered this encounter  Medications   HYDROcodone-acetaminophen (NORCO/VICODIN) 5-325 MG tablet    Sig: Take 1 tablet by mouth 3 (three) times daily as needed for moderate pain.    Dispense:  30 tablet    Refill:  0      Procedures: Large Joint Inj: R subacromial bursa on 02/19/2021 3:24 PM Indications: pain and diagnostic evaluation Details: 22 G 1.5 in needle  Arthrogram: No  Medications: 3 mL lidocaine 1 %; 40 mg methylPREDNISolone acetate 40 MG/ML Outcome: tolerated well, no immediate complications Procedure, treatment alternatives, risks and benefits explained, specific risks discussed. Consent was given by the patient. Immediately prior to procedure a time out was called to verify the correct patient, procedure, equipment, support staff and site/side marked as required. Patient was prepped and draped in the usual sterile fashion.      Clinical Data: No additional findings.   Subjective: Chief Complaint  Patient presents with   Right Shoulder - Pain  The patient is a 56 year old gentleman who we have actually seen years ago with right shoulder pain.  He has a  remote history of arthroscopic intervention done by someone who has since retired.  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 Veverly Fells with [facility] on that left shoulder and I agree with that as well in terms of the need for that type of surgery based on his clinical exam and x-ray as well as CT findings of that left shoulder.  However his right shoulder been hurting him recently and it really bothers him quite a bit with typing and other activities with laying on that side.  He cannot take anti-inflammatories since he has a kidney transplant.  He denies any specific recent injuries but it has been hurting with overhead activities and reaching behind him but has had no decrease in motion.  HPI  Review of Systems He currently denies any headache, chest pain, shortness of breath, fever, chills, nausea, vomiting  Objective: Vital Signs: There were no vitals taken for this visit.  Physical Exam He is alert and orient x3 and in no acute distress Ortho Exam Examination of his left shoulder shows significant limitations in range of motion and rotator cuff arthropathy in terms of the way he moves his shoulder in the grinding at the glenohumeral joint.  His right shoulder which is the painful shoulder that is acutely painful has excellent range of motion and  good strength.  There is positive Neer and Hawkins signs. Specialty Comments:  No specialty comments available.  Imaging: XR Shoulder Right  Result Date: 02/19/2021 3 views of the right shoulder showed no acute findings.  The shoulder is well located.  There has been a previous AC joint resection.    PMFS History: Patient Active Problem List   Diagnosis Date Noted   Pneumonia 04/28/2017   GERD (gastroesophageal reflux disease) 11/22/2015   Shoulder impingement, left 11/22/2015   Osteoarthritis of both feet 11/22/2015   Osteoarthritis of both knees 11/22/2015    Chondromalacia, 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/2015   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   Pain of left upper extremity 01/65/5374   Other complications due to renal dialysis device, implant, and graft 07/21/2013   Murmur 04/12/2013   Syncope 04/12/2013   Chronic kidney disease (CKD), stage IV (severe) ([facility]) 03/02/2013   End stage renal disease ([facility]) 02/25/2012   HYPOTHYROIDISM 01/14/2007   Gout 01/14/2007   HYPERTENSION 01/14/2007   GASTRITIS, CHRONIC 01/14/2007   IGA NEPHROPATHY 01/14/2007   OTHER DYSPHAGIA 01/14/2007   DIARRHEA 82/70/7867   HELICOBACTER PYLORI INFECTION, HX OF 01/14/2007   Past Medical History:  Diagnosis Date   Anemia    Arthritis    Chondromalacia, 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/2015   Chronic kidney disease    Eczema    hx: of   GERD (gastroesophageal reflux disease)    Gout    Hypertension    Hypothyroidism    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   Osteoarthritis of both feet 11/22/2015   Osteoarthritis of both knees 11/22/2015   Pneumonia    Shoulder impingement, left 11/22/2015    Family History  Problem Relation Age of Onset   Diabetes Father    Hypertension Father    Cancer Mother    Diabetes Brother     Past Surgical History:  Procedure Laterality Date   AV FISTULA PLACEMENT Left 03/18/2013   Procedure: ARTERIOVENOUS (AV) FISTULA CREATION- BRACHIOCEPHALIC;  Surgeon: Mal Misty, MD;  Location: [facility];  Service: Vascular;  Laterality: Left;   CATARACT EXTRACTION     01/2018 left 07/2018 right    COLONOSCOPY  11/25/2014   Colon polyp, staus post polypectomy. Internal Hemorrhoids. Otherwise normal colonoscopy to terminal ileum   HEMORRHOID SURGERY     KIDNEY TRANSPLANT  2020   RENAL BIOPSY     SHOULDER SURGERY     Social History   Occupational History   Not on file  Tobacco Use   Smoking status: Never   Smokeless tobacco: Never  Vaping Use   Vaping Use: Never used  Substance and Sexual Activity   Alcohol use: No   Drug use: Never   Sexual activity: Not on file

## 2021-03-03 ENCOUNTER — Other Ambulatory Visit: Payer: Self-pay | Admitting: Gastroenterology

## 2021-03-12 DIAGNOSIS — Z940 Kidney transplant status: Secondary | ICD-10-CM | POA: Diagnosis not present

## 2021-04-27 DIAGNOSIS — Z940 Kidney transplant status: Secondary | ICD-10-CM | POA: Diagnosis not present

## 2021-05-21 ENCOUNTER — Ambulatory Visit: Payer: 59 | Admitting: Orthopaedic Surgery

## 2021-05-30 ENCOUNTER — Ambulatory Visit: Payer: 59 | Admitting: Orthopaedic Surgery

## 2021-06-18 ENCOUNTER — Encounter: Payer: Self-pay | Admitting: Orthopaedic Surgery

## 2021-06-18 ENCOUNTER — Ambulatory Visit: Payer: 59 | Admitting: Orthopaedic Surgery

## 2021-06-18 DIAGNOSIS — M7541 Impingement syndrome of right shoulder: Secondary | ICD-10-CM

## 2021-06-18 DIAGNOSIS — M25511 Pain in right shoulder: Secondary | ICD-10-CM | POA: Diagnosis not present

## 2021-06-18 DIAGNOSIS — G8929 Other chronic pain: Secondary | ICD-10-CM

## 2021-06-18 MED ORDER — METHYLPREDNISOLONE ACETATE 40 MG/ML IJ SUSP
40.0000 mg | INTRAMUSCULAR | Status: AC | PRN
  Administered 2021-06-18: 40 mg via INTRA_ARTICULAR

## 2021-06-18 MED ORDER — LIDOCAINE HCL 1 % IJ SOLN
3.0000 mL | INTRAMUSCULAR | Status: AC | PRN
  Administered 2021-06-18: 3 mL

## 2021-06-18 NOTE — Progress Notes (Signed)
Office Visit Note   Patient: Colin Mcfarland           Date of Birth: 1965/05/26           MRN: [id number] Visit Date: 06/18/2021              Requested by: Colin Solian, MD [address] [place],  [place] [address] PCP: Colin Solian, MD   Assessment & Plan: Visit Diagnoses: No diagnosis found.  Plan: Per the patient's request I did provide a steroid injection in his right shoulder subacromial outlet which he tolerated well.  Follow-up is as needed.  Follow-Up Instructions: Return if symptoms worsen or fail to improve.   Orders:  Orders Placed This Encounter  Procedures   Large Joint Inj   No orders of the defined types were placed in this encounter.     Procedures: Large Joint Inj: R subacromial bursa on 06/18/2021 9:54 AM Indications: pain and diagnostic evaluation Details: 22 G 1.5 in needle  Arthrogram: No  Medications: 3 mL lidocaine 1 %; 40 mg methylPREDNISolone acetate 40 MG/ML Outcome: tolerated well, no immediate complications Procedure, treatment alternatives, risks and benefits explained, specific risks discussed. Consent was given by the patient. Immediately prior to procedure a time out was called to verify the correct patient, procedure, equipment, support staff and site/side marked as required. Patient was prepped and draped in the usual sterile fashion.      Clinical Data: No additional findings.   Subjective: Chief Complaint  Patient presents with   Right Shoulder - Follow-up  The patient is sometimes seen before.  He is 56 years old and he has shoulder impingement syndrome of the right shoulder.  He had an injection in the subacromial outlet with a steroid 4 months ago and this helped quite a bit.  He cannot take anti-inflammatories due to a kidney transplant.  He does have end-stage arthritis of his left shoulder and is considering a shoulder replacement in the future by Dr. Alma Mcfarland who I agree with that as well.  He is requesting a steroid  injection today.  He is having some soreness when he lays on that shoulder at night.  He denies any significant health issues otherwise.  HPI  Review of Systems There is currently no headache, chest pain, shortness of breath, fever, chills, nausea, vomiting  Objective: Vital Signs: There were no vitals taken for this visit.  Physical Exam He is alert and orient x3 and in no acute distress Ortho Exam Examination of his right shoulder shows full range of motion but definitely signs of impingement.  There is no weakness. Specialty Comments:  No specialty comments available.  Imaging: No results found.   PMFS History: Patient Active Problem List   Diagnosis Date Noted   Pneumonia 04/28/2017   GERD (gastroesophageal reflux disease) 11/22/2015   Shoulder impingement, left 11/22/2015   Osteoarthritis of both feet 11/22/2015   Osteoarthritis of both knees 11/22/2015   Chondromalacia, 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/2015   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   Pain of left upper extremity 26/37/8588   Other complications due to renal dialysis device, implant, and graft 07/21/2013   Murmur 04/12/2013   Syncope 04/12/2013   Chronic kidney disease (CKD), stage IV (severe) ([facility]) 03/02/2013   End stage renal disease ([facility]) 02/25/2012   HYPOTHYROIDISM 01/14/2007   Gout 01/14/2007   HYPERTENSION 01/14/2007   GASTRITIS, CHRONIC 01/14/2007   IGA NEPHROPATHY 01/14/2007   OTHER DYSPHAGIA 01/14/2007   DIARRHEA 50/27/7412   HELICOBACTER PYLORI INFECTION, HX OF 01/14/2007  Past Medical History:  Diagnosis Date   Anemia    Arthritis    Chondromalacia, 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/2015   Chronic kidney disease    Eczema    hx: of   GERD (gastroesophageal reflux disease)    Gout    Hypertension    Hypothyroidism    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   Osteoarthritis of both feet 11/22/2015   Osteoarthritis of both knees 11/22/2015   Pneumonia    Shoulder impingement, left 11/22/2015    Family History  Problem Relation Age of Onset    Diabetes Father    Hypertension Father    Cancer Mother    Diabetes Brother     Past Surgical History:  Procedure Laterality Date   AV FISTULA PLACEMENT Left 03/18/2013   Procedure: ARTERIOVENOUS (AV) FISTULA CREATION- BRACHIOCEPHALIC;  Surgeon: Mal Misty, MD;  Location: [facility];  Service: Vascular;  Laterality: Left;   CATARACT EXTRACTION     01/2018 left 07/2018 right    COLONOSCOPY  11/25/2014   Colon polyp, staus post polypectomy. Internal Hemorrhoids. Otherwise normal colonoscopy to terminal ileum   HEMORRHOID SURGERY     KIDNEY TRANSPLANT  2020   RENAL BIOPSY     SHOULDER SURGERY     Social History   Occupational History   Not on file  Tobacco Use   Smoking status: Never   Smokeless tobacco: Never  Vaping Use   Vaping Use: Never used  Substance and Sexual Activity   Alcohol use: No   Drug use: Never   Sexual activity: Not on file

## 2021-07-19 DIAGNOSIS — Z940 Kidney transplant status: Secondary | ICD-10-CM | POA: Diagnosis not present

## 2021-08-16 ENCOUNTER — Other Ambulatory Visit: Payer: Self-pay | Admitting: Gastroenterology

## 2021-08-17 ENCOUNTER — Ambulatory Visit (HOSPITAL_COMMUNITY): Payer: 59 | Attending: Cardiology

## 2021-08-17 DIAGNOSIS — I351 Nonrheumatic aortic (valve) insufficiency: Secondary | ICD-10-CM

## 2021-08-17 LAB — ECHOCARDIOGRAM COMPLETE
Area-P 1/2: 2.91 cm2
P 1/2 time: 373 msec
S' Lateral: 3.7 cm

## 2021-08-31 ENCOUNTER — Other Ambulatory Visit: Payer: Self-pay | Admitting: Gastroenterology

## 2021-09-26 ENCOUNTER — Ambulatory Visit: Payer: 59 | Admitting: Orthopaedic Surgery

## 2021-09-26 ENCOUNTER — Encounter: Payer: Self-pay | Admitting: Orthopaedic Surgery

## 2021-09-26 DIAGNOSIS — M67431 Ganglion, right wrist: Secondary | ICD-10-CM | POA: Diagnosis not present

## 2021-09-26 NOTE — Progress Notes (Signed)
The patient comes in today for evaluation treatment of a soft tissue mass along the dorsal aspect of his right wrist.  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  On exam there is a small soft tissue mass on the dorsal radial aspect of the wrist.  It is hypermobile but very small.  He does state that it gets larger at times.  I did try to aspirate fluid from this area but was unsuccessful.  I think it did break it up some.  I gave him reassurance that this looks like a benign cyst.  If it does get larger he knows to come back and see [place] and hopefully we can aspirated then.  We could always see if Dr. Rolena Infante could assess it with ultrasound and aspirated if we are unsuccessful.

## 2021-10-22 DIAGNOSIS — Z940 Kidney transplant status: Secondary | ICD-10-CM | POA: Diagnosis not present

## 2021-12-21 ENCOUNTER — Other Ambulatory Visit: Payer: Self-pay | Admitting: Urology

## 2022-09-02 ENCOUNTER — Ambulatory Visit: Payer: 59 | Admitting: Orthopaedic Surgery

## 2022-12-27 ENCOUNTER — Other Ambulatory Visit: Payer: Self-pay | Admitting: Surgery

## 2023-01-06 ENCOUNTER — Other Ambulatory Visit: Payer: Self-pay | Admitting: Urology

## 2023-01-27 NOTE — Progress Notes (Addendum)
Anesthesia Review:  PCP: DR Felipa Eth  Cardiologist : DR Charlton Haws Lov 08/28/20  Transplant MD- Jacqualyn Posey LOV 01/27/23.  Chest x-ray : EKG : 01/31/23  Echo : 08/17/21  Stress test: Cardiac Cath :  Activity level: can do a flight of stairs without difficutly  Sleep Study/ CPAP : hx of sleep apnea no cpap per pt  Fasting Blood Sugar :      / Checks Blood Sugar -- times a day:   Blood Thinner/ Instructions /Last Dose: ASA / Instructions/ Last Dose :    81 mg aspirin    Prediabetes-  Januvia- none day of surgery.     Fistula in left arm - not in use has not been in use Was on dialysis 4 years prior to kidney transplant.   since received kidney transplant    Labs  of CBC and Renal Function Panel done on 01/27/23.  Routed to Dr Carman Ching on 01/31/23.  Sodium- 131 and White count 12.6

## 2023-01-27 NOTE — Patient Instructions (Addendum)
SURGICAL WAITING ROOM VISITATION  Patients having surgery or a procedure may have no more than 2 support people in the waiting area - these visitors may rotate.    Children under the age of 6 must have an adult with them who is not the patient.  Due to an increase in RSV and influenza rates and associated hospitalizations, children ages 23 and under may not visit patients in [facility] hospitals.  If the patient needs to stay at the hospital during part of their recovery, the visitor guidelines for inpatient rooms apply. Pre-op nurse will coordinate an appropriate time for 1 support person to accompany patient in pre-op.  This support person may not rotate.    Please refer to the [facility] website for the visitor guidelines for Inpatients (after your surgery is over and you are in a regular room).       Your procedure is scheduled on: 02/11/22    Report to [facility] Main Entrance    Report to admitting at  0630 AM   Call this number if you have problems the morning of surgery [phone]   Do not eat food :After Midnight.   After Midnight you may have the following liquids until __ 0530____ AM DAY OF SURGERY  Water Non-Citrus Juices (without pulp, NO RED-Apple, White grape, White cranberry) Black Coffee (NO MILK/CREAM OR CREAMERS, sugar ok)  Clear Tea (NO MILK/CREAM OR CREAMERS, sugar ok) regular and decaf                             Plain Jell-O (NO RED)                                           Fruit ices (not with fruit pulp, NO RED)                                     Popsicles (NO RED)                                                               Sports drinks like Gatorade (NO RED)                     The day of surgery:  Drink ONE (1) Pre-Surgery Clear Ensure or G2 at  0530AM ( have completed by )  the morning of surgery. Drink in one sitting. Do not sip.  This drink was given to you during your hospital  pre-op appointment visit. Nothing else to drink  after completing the  Pre-Surgery Clear Ensure or G2.          If you have questions, please contact your surgeon's office.      Oral Hygiene is also important to reduce your risk of infection.                                    Remember - BRUSH YOUR TEETH THE MORNING OF SURGERY WITH YOUR REGULAR  TOOTHPASTE  DENTURES WILL BE REMOVED PRIOR TO SURGERY PLEASE DO NOT APPLY "Poly grip" OR ADHESIVES!!!   Do NOT smoke after Midnight   Stop all vitamins and herbal supplements 7 days before surgery.   Take these medicines the morning of surgery with A SIP OF WATER:  prevacid, synthroid, cellcept, prograf,                  Januvia- none am of surgery             DO NOT TAKE ANY ORAL DIABETIC MEDICATIONS DAY OF YOUR SURGERY  Bring CPAP mask and tubing day of surgery.                              You may not have any metal on your body including hair pins, jewelry, and body piercing             Do not wear make-up, lotions, powders, perfumes/cologne, or deodorant  Do not wear nail polish including gel and S&S, artificial/acrylic nails, or any other type of covering on natural nails including finger and toenails. If you have artificial nails, gel coating, etc. that needs to be removed by a nail salon please have this removed prior to surgery or surgery may need to be canceled/ delayed if the surgeon/ anesthesia feels like they are unable to be safely monitored.   Do not shave  48 hours prior to surgery.               Men may shave face and neck.   Do not bring valuables to the hospital. [facility] IS NOT             RESPONSIBLE   FOR VALUABLES.   Contacts, glasses, dentures or bridgework may not be worn into surgery.   Bring small overnight bag day of surgery.   DO NOT BRING YOUR HOME MEDICATIONS TO THE HOSPITAL. PHARMACY WILL DISPENSE MEDICATIONS LISTED ON YOUR MEDICATION LIST TO YOU DURING YOUR ADMISSION IN THE HOSPITAL!    Patients discharged on the day of surgery will not be  allowed to drive home.  Someone NEEDS to stay with you for the first 24 hours after anesthesia.   Special Instructions: Bring a copy of your healthcare power of attorney and living will documents the day of surgery if you haven't scanned them before.              Please read over the following fact sheets you were given: IF YOU HAVE QUESTIONS ABOUT YOUR PRE-OP INSTRUCTIONS PLEASE CALL [phone]   If you received a COVID test during your pre-op visit  it is requested that you wear a mask when out in public, stay away from anyone that may not be feeling well and notify your surgeon if you develop symptoms. If you test positive for Covid or have been in contact with anyone that has tested positive in the last 10 days please notify you surgeon.    [facility] - Preparing for Surgery Before surgery, you can play an important role.  Because skin is not sterile, your skin needs to be as free of germs as possible.  You can reduce the number of germs on your skin by washing with CHG (chlorahexidine gluconate) soap before surgery.  CHG is an antiseptic cleaner which kills germs and bonds with the skin to continue killing germs even after washing. Please DO NOT use if you have  an allergy to CHG or antibacterial soaps.  If your skin becomes reddened/irritated stop using the CHG and inform your nurse when you arrive at Short Stay. Do not shave (including legs and underarms) for at least 48 hours prior to the first CHG shower.  You may shave your face/neck. Please follow these instructions carefully:  1.  Shower with CHG Soap the night before surgery and the  morning of Surgery.  2.  If you choose to wash your hair, wash your hair first as usual with your  normal  shampoo.  3.  After you shampoo, rinse your hair and body thoroughly to remove the  shampoo.                           4.  Use CHG as you would any other liquid soap.  You can apply chg directly  to the skin and wash                       Gently with  a scrungie or clean washcloth.  5.  Apply the CHG Soap to your body ONLY FROM THE NECK DOWN.   Do not use on face/ open                           Wound or open sores. Avoid contact with eyes, ears mouth and genitals (private parts).                       Wash face,  Genitals (private parts) with your normal soap.             6.  Wash thoroughly, paying special attention to the area where your surgery  will be performed.  7.  Thoroughly rinse your body with warm water from the neck down.  8.  DO NOT shower/wash with your normal soap after using and rinsing off  the CHG Soap.                9.  Pat yourself dry with a clean towel.            10.  Wear clean pajamas.            11.  Place clean sheets on your bed the night of your first shower and do not  sleep with pets. Day of Surgery : Do not apply any lotions/deodorants the morning of surgery.  Please wear clean clothes to the hospital/surgery center.  FAILURE TO FOLLOW THESE INSTRUCTIONS MAY RESULT IN THE CANCELLATION OF YOUR SURGERY PATIENT SIGNATURE_________________________________  NURSE SIGNATURE__________________________________  ________________________________________________________________________

## 2023-01-31 ENCOUNTER — Other Ambulatory Visit: Payer: Self-pay

## 2023-01-31 ENCOUNTER — Encounter (HOSPITAL_COMMUNITY): Payer: Self-pay

## 2023-01-31 ENCOUNTER — Encounter (HOSPITAL_COMMUNITY)
Admission: RE | Admit: 2023-01-31 | Discharge: 2023-01-31 | Disposition: A | Discharge status: Home / Self Care | Payer: 59 | Source: Ambulatory Visit | Attending: Surgery | Admitting: Surgery

## 2023-01-31 VITALS — BP 145/81 | HR 77 | Temp 98.1°F | Resp 16 | Ht 67.0 in | Wt 157.0 lb

## 2023-01-31 DIAGNOSIS — K4090 Unilateral inguinal hernia, without obstruction or gangrene, not specified as recurrent: Secondary | ICD-10-CM | POA: Diagnosis not present

## 2023-01-31 DIAGNOSIS — N189 Chronic kidney disease, unspecified: Secondary | ICD-10-CM | POA: Insufficient documentation

## 2023-01-31 DIAGNOSIS — Z01818 Encounter for other preprocedural examination: Secondary | ICD-10-CM | POA: Diagnosis present

## 2023-01-31 DIAGNOSIS — Z940 Kidney transplant status: Secondary | ICD-10-CM | POA: Insufficient documentation

## 2023-01-31 DIAGNOSIS — E039 Hypothyroidism, unspecified: Secondary | ICD-10-CM | POA: Insufficient documentation

## 2023-01-31 DIAGNOSIS — G4730 Sleep apnea, unspecified: Secondary | ICD-10-CM | POA: Diagnosis not present

## 2023-01-31 HISTORY — DX: Depression, unspecified: F32.A

## 2023-01-31 HISTORY — DX: Prediabetes: R73.03

## 2023-01-31 HISTORY — DX: Sleep apnea, unspecified: G47.30

## 2023-01-31 HISTORY — DX: Anxiety disorder, unspecified: F41.9

## 2023-02-03 NOTE — Progress Notes (Signed)
Anesthesia Chart Review   Case: [id number] Date/Time: 02/12/23 0815   Procedure: OPEN RIGHT INGUINALHERNIA REPAIR WITH MESH (Right)   Anesthesia type: General   Pre-op diagnosis: Right Inguinal Hernia   [facility] ROOM 01 / [facility]   Surgeons: Abigail Miyamoto, MD       DISCUSSION:58 y.o. never smoker with h/o sleep apnea, hypothyroidism, CKD, s/p kidney transplant, right inguinal hernia scheduled for above procedure 02/12/2023 with Dr. Abigail Miyamoto.   Pt follow with [facility].  Last seen 01/27/2023. Stable at this visit.   Anticipate pt can proceed with planned procedure barring acute status change.   VS: BP (!) 145/81   Pulse 77   Temp 36.7 C (Oral)   Resp 16   Ht 5\' 7"  (1.702 m)   Wt 71.2 kg   SpO2 98%   BMI 24.59 kg/m   PROVIDERS: Avva, Ravisankar, MD is PCP    LABS: Labs reviewed: Acceptable for surgery. (all labs ordered are listed, but only abnormal results are displayed)  Labs Reviewed - No data to display   IMAGES:   EKG:   CV: Echo 08/17/2021  1. Left ventricular ejection fraction, by estimation, is 60 to 65%. The  left ventricle has normal function. The left ventricle has no regional  wall motion abnormalities. Left ventricular diastolic parameters are  consistent with Grade I diastolic  dysfunction (impaired relaxation).   2. Right ventricular systolic function is normal. The right ventricular  size is normal. There is normal pulmonary artery systolic pressure. The  estimated right ventricular systolic pressure is 25.3 mmHg.   3. The mitral valve is normal in structure. Trivial mitral valve  regurgitation.   4. The aortic valve is tricuspid. There is mild thickening of the aortic  valve. Aortic valve regurgitation is mild to moderate. No aortic stenosis  is present.   5. Aortic dilatation noted. There is borderline dilatation of the  ascending aorta, measuring 37 mm.  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 Internal Hemorrhoids. Otherwise normal colonoscopy to terminal ileum   HEMORRHOID SURGERY     KIDNEY TRANSPLANT  2020   RENAL BIOPSY     SHOULDER SURGERY     UPPER GI ENDOSCOPY      MEDICATIONS:  acetaminophen (TYLENOL) 325 MG tablet   ALPRAZolam (XANAX) 0.25 MG tablet   amoxicillin (AMOXIL) 500 MG capsule   aspirin EC 81 MG tablet   Blood Glucose Monitoring Suppl (ONETOUCH VERIO) w/Device KIT   clobetasol ointment (TEMOVATE) 0.05 %   Insulin Syringe-Needle U-100 31G X 15/64" 0.3 ML MISC   Lancets (ONETOUCH DELICA PLUS  LANCET33G) MISC   lansoprazole (PREVACID) 30 MG capsule   levothyroxine (SYNTHROID) 137 MCG tablet   mycophenolate (CELLCEPT) 250 MG capsule   ONETOUCH VERIO test strip   polyethylene glycol (MIRALAX / GLYCOLAX) 17 g packet   pravastatin (PRAVACHOL) 20 MG tablet   predniSONE (DELTASONE) 5 MG tablet   sitaGLIPtin (JANUVIA) 100 MG tablet   tacrolimus (PROGRAF) 0.5 MG capsule   tamsulosin (FLOMAX) 0.4 MG CAPS capsule   traMADol (ULTRAM) 50 MG tablet   No current facility-administered medications for this encounter.      Jodell Cipro Ward, PA-C [facility] [phone]

## 2023-02-10 ENCOUNTER — Encounter: Payer: Self-pay | Admitting: Physician Assistant

## 2023-02-10 ENCOUNTER — Ambulatory Visit: Payer: 59 | Admitting: Physician Assistant

## 2023-02-10 DIAGNOSIS — M25531 Pain in right wrist: Secondary | ICD-10-CM

## 2023-02-10 NOTE — Progress Notes (Signed)
HPI: Mr. Colin Mcfarland 58 year old male seen Dr. Raye Sorrow in the past comes in today for new complaint of her right wrist pain.  He states that a week ago he was buttoning on his pants and had a shooting pain in his wrist and forearm he points to the medial aspect.  He notes his grip is decreased.  He has soreness distal forearm area.  Notes no swelling no ecchymosis.  He is tried Tylenol and Biofreeze.  Symptoms overall are trending towards improvement.  Denies any numbness tingling the hand.  Review of systems: See HPI otherwise negative  Physical exam: General: Well-developed well-nourished male no acute distress Psych: Alert and oriented x 3 Bilateral hands: Full motor.  Radial pulses are 2+ equal and symmetric.  No rashes skin lesions, ulcerations or ecchymosis.  Grind test bilaterally at thumb is negative.  Positive Finkelstein on the right negative on the left.  Tenderness over the extensor pollicis longus up into the muscle belly region right arm.  Impression: Right wrist strain  Plan: Recommend use of a thumb spica removable Velcro splint weeks be worn for the next 2 weeks except for when bathing.  Have him follow-up with [place] if pain persist or does not improve.  Questions were encouraged and answered at length.

## 2023-02-11 NOTE — H&P (Signed)
REFERRING PHYSICIAN: Marti Sleigh PROVIDER: Wayne Both, MD MRN: [id number] DOB: [birth year]  Subjective   Chief Complaint: New Consultation (Right groin bulge consistent with hernia)  History of Present Illness: Colin Mcfarland is a 58 y.o. male who is seen as an office consultation for evaluation of New Consultation (Right groin bulge consistent with hernia)  This is a pleasant 58 year old gentleman with a complex medical history including a renal transplant who is referred here for evaluation of a right inguinal hernia. He has noticed the bulge in his right inguinal area for about a year. It causes minimal discomfort and no obstructive symptoms. Otherwise, he reports he has been doing very well. He follows up routinely with his primary care provider, his nephrologist, and the transplant surgeons at [facility].  Review of Systems: A complete review of systems was obtained from the patient. I have reviewed this information and discussed as appropriate with the patient. See HPI as well for other ROS.  ROS   Medical History: Past Medical History:  Diagnosis Date  Arthritis  Bilateral Knee Pain  End stage renal disease (CMS/HHS-HCC)  Gastritis 2000  H. 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 Pylori gastritis  ARTHROSCOPIC ROTATOR CUFF REPAIR Right 2012  ESOPHAGOGASTRODOUDENOSCOPY W/REMOVAL LESION BY SNARE 10/09/2017  Procedure: ESOPHAGOGASTRODUODENOSCOPY, FLEXIBLE, TRANSORAL; WITH REMOVAL OF TUMOR(S), POLYP(S), OR OTHER LESION(S) BY SNARE TECHNIQUE; Surgeon: Morene Antu, MD; Location: [facility] ENDO/BRONCH; Service: Gastroenterology;;  Jannifer Rodney W/BIOPSY 10/09/2017  Procedure: ESOPHAGOGASTRODUODENOSCOPY, FLEXIBLE, TRANSORAL; WITH BIOPSY, SINGLE OR MULTIPLE; Surgeon: Morene Antu, MD; Location: [facility] ENDO/BRONCH; Service: Gastroenterology;;  Jannifer Rodney W/BIOPSY N/A 11/14/2017  Procedure: ESOPHAGOGASTRODUODENOSCOPY, FLEXIBLE, TRANSORAL; WITH BIOPSY, SINGLE OR MULTIPLE; Surgeon: Jeanice Lim, MD; Location: [facility] ENDO/BRONCH; Service: Gastroenterology; Laterality: N/A;  TRANSPLANT KIDNEY N/A 02/04/2018  Procedure: RENAL ALLOTRANSPLANTATION, IMPLANTATION OF GRAFT; WITHOUT RECIPIENT NEPHRECTOMY; Surgeon: Jonne Ply, MD; Location: [facility] OR; Service: General Surgery; Laterality: N/A;  ESOPHAGOGASTRODOUDENOSCOPY W/BIOPSY N/A 11/19/2018  Procedure: ESOPHAGOGASTRODUODENOSCOPY, FLEXIBLE, TRANSORAL; WITH BIOPSY, SINGLE OR MULTIPLE; Surgeon: Morene Antu, MD; Location: [facility] ENDO/BRONCH; Service: Gastroenterology; Laterality: N/A;  ESOPHAGOGASTRODOUDENOSCOPY W/BIOPSY N/A 12/13/2019  Procedure: ESOPHAGOGASTRODUODENOSCOPY, FLEXIBLE, TRANSORAL; WITH BIOPSY, SINGLE OR MULTIPLE; Surgeon: Morene Antu, MD; Location: [facility] ENDO/BRONCH; Service: Gastroenterology; Laterality: N/A;  AV fistula Left  Upper arm    Allergies  Allergen Reactions  Blue Dye Hives  Febuxostat Other (See Comments) and Unknown  Joints stiffened  Sulfa (Sulfonamide Antibiotics) Hives and Rash  Sulfaguanidine Rash   Alatrofloxacin Mesylate Rash and Unknown  Escitalopram Other (See Comments)  Constipation, Hypertension  Olmesartan Other (See Comments)  Join stiffness  Paricalcitol Rash and Unknown  Rosuvastatin Other (See Comments)   Current Outpatient Medications on File Prior to Visit  Medication Sig Dispense Refill  acetaminophen (TYLENOL) 325 MG tablet Take 2  tablets (650 mg total) by mouth every 6 (six) hours as needed for Pain 100 tablet 0  ALPRAZolam (XANAX) 0.25 MG tablet Take 1 tablet (0.25 mg total) by mouth once daily as needed  amoxicillin (AMOXIL) 500 MG tablet Take 500 mg by mouth once daily as needed For dental procedures only  aspirin 81 MG EC tablet Take 1 tablet (81 mg total) by mouth once daily 30 tablet 11  clobetasoL (TEMOVATE) 0.05 % ointment Apply topically 2 (two) times daily  CONTOUR NEXT ONE METER Misc 1 each by XX route as directed 1 kit 1  CONTOUR NEXT TEST STRIPS test strip 1 each (1 strip total) 2 (two) times daily for 360 days Use as instructed. 180 each 3  insulin LISPRO (HUMALOG KWIKPEN) pen injector (concentration 100 units/mL) Inject SQ according sliding scale for high sugar 15 mL 1  insulin syringe-needle U-100 0.3 mL 31 gauge x 15/64" Syrg  lancing device with lancets kit Use 1 each 2 (two) times daily Product selection permitted according to insurance preference. E09.65 Drug induced diabetes 250 each 3  lansoprazole (PREVACID) 30 MG DR capsule Take 1 capsule (30 mg total) by mouth once daily as needed  levothyroxine (SYNTHROID) 137 MCG tablet Take 137 mcg by mouth once daily  melatonin (MELATIN ORAL) Take 5 mg by mouth once daily Split the tablet up into 4 pieces  metFORMIN (GLUCOPHAGE-XR) 500 MG XR tablet Take 1 tablet (500 mg total) by mouth daily with dinner 90 tablet 3  mycophenolate (CELLCEPT) 250 mg capsule TAKE 4 CAPSULES BY MOUTH EVERY 12 HOURS 240 capsule 11  pen needle, diabetic (BD ULTRA-FINE NANO PEN NEEDLE) 32 gauge x 5/32" Ndle Use 1 each once daily  as needed 100 each 1  polyethylene glycol (MIRALAX) powder Take 1 g by mouth once daily  pravastatin (PRAVACHOL) 20 MG tablet Take 20 mg by mouth once daily  predniSONE (DELTASONE) 5 MG tablet TAKE 1 TABLET BY MOUTH ONCE DAILY 90 tablet 3  sildenafiL (VIAGRA) 100 MG tablet Take 1 tablet (100 mg total) by mouth as needed  SITagliptin phosphate (JANUVIA) 100 MG tablet Take 1 tablet (100 mg total) by mouth once daily for 360 days 90 tablet 3  tacrolimus (PROGRAF) 0.5 MG capsule Take 3 capsules (1.5 mg total) by mouth every morning AND 2 capsules (1 mg total) every evening. 150 capsule 11  tamsulosin (FLOMAX) 0.4 mg capsule Take 1 capsule (0.4 mg total) by mouth once daily Take 30 minutes after same meal each day. 7 capsule 0  traMADoL (ULTRAM) 50 mg tablet Take 15 mg by mouth every 6 (six) hours as needed for Pain As needed  cholecalciferol (VITAMIN D3) 2,000 unit tablet Take 1 tablet (2,000 Units total) by mouth once daily 30 tablet 11   No current facility-administered medications on file prior to visit.   Family History  Problem Relation Age of Onset  Diabetes Father  High blood pressure (Hypertension) Father  Diabetes Brother  Diabetes Brother  High blood pressure (Hypertension) Mother  Anesthesia problems Neg Hx    Social History   Tobacco Use  Smoking Status Never  Passive exposure: Never  Smokeless Tobacco Never    Social History   Socioeconomic History  Marital status: Married  Occupational History  Occupation: Quarry manager  Tobacco Use  Smoking status: Never  Passive exposure: Never  Smokeless tobacco: Never  Vaping Use  Vaping status: Never Used  Substance and Sexual Activity  Alcohol use: Not Currently  Comment: rare social occasion  Drug use: No  Sexual activity: Not Currently  Partners: Female   Social Drivers of Health   Financial Resource Strain: Low Risk (04/28/2022)  Overall Financial Resource Strain (CARDIA)  Difficulty of Paying Living  Expenses: Not hard at all  Food Insecurity: No Food Insecurity (04/28/2022)  Hunger Vital Sign  Worried About Running Out of Food in the Last Year: Never true  Ran Out of Food in the Last Year: Never true  Transportation Needs: No Transportation Needs (04/28/2022)  PRAPARE - Risk analyst (Medical): No  Lack of Transportation (Non-Medical): No  Physical Activity: Insufficiently Active (10/29/2020)  Exercise Vital Sign  Days of Exercise per Week: 4 days  Minutes of Exercise per Session: 10 min  Stress: Stress Concern Present (10/29/2020)  [employer] of Occupational Health - Occupational Stress Questionnaire  Feeling of Stress : To some extent   Objective:   Vitals:   BP: (!) 164/83  Pulse: 67  Temp: 36.8 C (98.2 F)  SpO2: 99%  Weight: 73.4 kg (161 lb 12.8 oz)  PainSc: 0-No pain   Body mass index is 26 kg/m.  Physical Exam   He appears well on exam  He has a well-healed incision in his right lower quadrant. Below this he has an easily reducible moderate sized right inguinal hernia. There is no evidence of left inguinal hernia or umbilical hernia  Labs, Imaging and Diagnostic Testing: Reviewed his notes in the electronic medical records  Assessment and Plan:   Diagnoses and all orders for this visit:  Right inguinal hernia   We does have a right inguinal hernia.  I discussed abdominal wall anatomy with him and hernias. We discussed continued conservative management versus repair of the hernia. I would recommend an open repair given the size of the hernia and his symptomatology as well as the risk to his transplanted kidney should he develop incarceration or problems with the hernia. We discussed the surgical procedure in detail. I discussed the risks which includes but is not limited to bleeding, infection, injury to surrounding structures, nerve entrapment, chronic pain, use of mesh, effects on his transplanted kidney with anesthesia,  cardiopulmonary issues, postop recovery, etc. He understands and wishes to proceed with surgery which will be scheduled.

## 2023-02-12 ENCOUNTER — Encounter (HOSPITAL_COMMUNITY): Payer: Self-pay | Admitting: Surgery

## 2023-02-12 ENCOUNTER — Encounter (HOSPITAL_COMMUNITY)
Admission: RE | Disposition: A | Discharge status: Home / Self Care | Payer: Self-pay | Source: Home / Self Care | Attending: Surgery

## 2023-02-12 ENCOUNTER — Ambulatory Visit (HOSPITAL_BASED_OUTPATIENT_CLINIC_OR_DEPARTMENT_OTHER): Payer: 59 | Admitting: Anesthesiology

## 2023-02-12 ENCOUNTER — Ambulatory Visit (HOSPITAL_COMMUNITY): Payer: 59 | Admitting: Physician Assistant

## 2023-02-12 ENCOUNTER — Ambulatory Visit (HOSPITAL_COMMUNITY)
Admission: RE | Admit: 2023-02-12 | Discharge: 2023-02-12 | Disposition: A | Discharge status: Home / Self Care | Payer: 59 | Attending: Surgery | Admitting: Surgery

## 2023-02-12 ENCOUNTER — Other Ambulatory Visit: Payer: Self-pay

## 2023-02-12 DIAGNOSIS — I120 Hypertensive chronic kidney disease with stage 5 chronic kidney disease or end stage renal disease: Secondary | ICD-10-CM | POA: Diagnosis not present

## 2023-02-12 DIAGNOSIS — Z7984 Long term (current) use of oral hypoglycemic drugs: Secondary | ICD-10-CM | POA: Diagnosis not present

## 2023-02-12 DIAGNOSIS — K219 Gastro-esophageal reflux disease without esophagitis: Secondary | ICD-10-CM | POA: Insufficient documentation

## 2023-02-12 DIAGNOSIS — N186 End stage renal disease: Secondary | ICD-10-CM | POA: Insufficient documentation

## 2023-02-12 DIAGNOSIS — Z01818 Encounter for other preprocedural examination: Secondary | ICD-10-CM

## 2023-02-12 DIAGNOSIS — Z794 Long term (current) use of insulin: Secondary | ICD-10-CM | POA: Insufficient documentation

## 2023-02-12 DIAGNOSIS — G4733 Obstructive sleep apnea (adult) (pediatric): Secondary | ICD-10-CM | POA: Diagnosis not present

## 2023-02-12 DIAGNOSIS — E1122 Type 2 diabetes mellitus with diabetic chronic kidney disease: Secondary | ICD-10-CM | POA: Diagnosis not present

## 2023-02-12 DIAGNOSIS — K4090 Unilateral inguinal hernia, without obstruction or gangrene, not specified as recurrent: Secondary | ICD-10-CM

## 2023-02-12 DIAGNOSIS — Z940 Kidney transplant status: Secondary | ICD-10-CM | POA: Insufficient documentation

## 2023-02-12 HISTORY — PX: INGUINAL HERNIA REPAIR: SHX194

## 2023-02-12 LAB — GLUCOSE, CAPILLARY
Glucose-Capillary: 124 mg/dL — ABNORMAL HIGH (ref 70–99)
Glucose-Capillary: 142 mg/dL — ABNORMAL HIGH (ref 70–99)

## 2023-02-12 SURGERY — REPAIR, HERNIA, INGUINAL, ADULT
Anesthesia: General | Laterality: Right

## 2023-02-12 MED ORDER — ONDANSETRON HCL 4 MG/2ML IJ SOLN
INTRAMUSCULAR | Status: AC
  Filled 2023-02-12: qty 2

## 2023-02-12 MED ORDER — FENTANYL CITRATE (PF) 100 MCG/2ML IJ SOLN
INTRAMUSCULAR | Status: AC
  Filled 2023-02-12: qty 2

## 2023-02-12 MED ORDER — OXYCODONE HCL 5 MG PO TABS: 5.0000 mg | ORAL_TABLET | Freq: Once | ORAL | Status: DC | PRN

## 2023-02-12 MED ORDER — 0.9 % SODIUM CHLORIDE (POUR BTL) OPTIME
TOPICAL | Status: DC | PRN
  Administered 2023-02-12: 1000 mL

## 2023-02-12 MED ORDER — CHLORHEXIDINE GLUCONATE CLOTH 2 % EX PADS: 6.0000 | MEDICATED_PAD | Freq: Once | CUTANEOUS | Status: DC

## 2023-02-12 MED ORDER — CEFAZOLIN SODIUM-DEXTROSE 2-4 GM/100ML-% IV SOLN
2.0000 g | INTRAVENOUS | Status: AC
  Administered 2023-02-12: 2 g via INTRAVENOUS
  Filled 2023-02-12: qty 100

## 2023-02-12 MED ORDER — LIDOCAINE HCL (PF) 2 % IJ SOLN
INTRAMUSCULAR | Status: AC
  Filled 2023-02-12: qty 5

## 2023-02-12 MED ORDER — INSULIN ASPART 100 UNIT/ML IJ SOLN: 0.0000 [IU] | INTRAMUSCULAR | Status: DC | PRN

## 2023-02-12 MED ORDER — CHLORHEXIDINE GLUCONATE 0.12 % MT SOLN
15.0000 mL | Freq: Once | OROMUCOSAL | Status: AC
Start: 2023-02-12 — End: 2023-02-12
  Administered 2023-02-12: 15 mL via OROMUCOSAL

## 2023-02-12 MED ORDER — ENSURE PRE-SURGERY PO LIQD
296.0000 mL | Freq: Once | ORAL | Status: DC
  Filled 2023-02-12: qty 296

## 2023-02-12 MED ORDER — DEXAMETHASONE SODIUM PHOSPHATE 10 MG/ML IJ SOLN
INTRAMUSCULAR | Status: DC | PRN
  Administered 2023-02-12: 10 mg via INTRAVENOUS

## 2023-02-12 MED ORDER — BUPIVACAINE HCL (PF) 0.5 % IJ SOLN
INTRAMUSCULAR | Status: DC | PRN
  Administered 2023-02-12: 20 mL

## 2023-02-12 MED ORDER — OXYCODONE HCL 5 MG/5ML PO SOLN: 5.0000 mg | Freq: Once | ORAL | Status: DC | PRN

## 2023-02-12 MED ORDER — LIDOCAINE 20MG/ML (2%) 15 ML SYRINGE OPTIME
INTRAMUSCULAR | Status: DC | PRN
  Administered 2023-02-12: 60 mg via INTRAVENOUS
  Administered 2023-02-12: 1.5 mg/kg/h via INTRAVENOUS

## 2023-02-12 MED ORDER — ONDANSETRON HCL 4 MG/2ML IJ SOLN
INTRAMUSCULAR | Status: DC | PRN
  Administered 2023-02-12: 4 mg via INTRAVENOUS

## 2023-02-12 MED ORDER — PHENYLEPHRINE 80 MCG/ML (10ML) SYRINGE FOR IV PUSH (FOR BLOOD PRESSURE SUPPORT)
PREFILLED_SYRINGE | INTRAVENOUS | Status: DC | PRN
  Administered 2023-02-12: 80 ug via INTRAVENOUS

## 2023-02-12 MED ORDER — FENTANYL CITRATE (PF) 100 MCG/2ML IJ SOLN
INTRAMUSCULAR | Status: DC | PRN
  Administered 2023-02-12: 100 ug via INTRAVENOUS

## 2023-02-12 MED ORDER — ROCURONIUM BROMIDE 100 MG/10ML IV SOLN
INTRAVENOUS | Status: DC | PRN
  Administered 2023-02-12: 50 mg via INTRAVENOUS

## 2023-02-12 MED ORDER — DEXAMETHASONE SODIUM PHOSPHATE 10 MG/ML IJ SOLN
INTRAMUSCULAR | Status: AC
  Filled 2023-02-12: qty 1

## 2023-02-12 MED ORDER — AMISULPRIDE (ANTIEMETIC) 5 MG/2ML IV SOLN
10.0000 mg | Freq: Once | INTRAVENOUS | Status: DC | PRN
Start: 2023-02-12 — End: 2023-02-12

## 2023-02-12 MED ORDER — ORAL CARE MOUTH RINSE: 15.0000 mL | Freq: Once | OROMUCOSAL | Status: AC

## 2023-02-12 MED ORDER — SUGAMMADEX SODIUM 200 MG/2ML IV SOLN
INTRAVENOUS | Status: DC | PRN
  Administered 2023-02-12: 200 mg via INTRAVENOUS

## 2023-02-12 MED ORDER — PROPOFOL 10 MG/ML IV BOLUS
INTRAVENOUS | Status: DC | PRN
  Administered 2023-02-12: 200 mg via INTRAVENOUS

## 2023-02-12 MED ORDER — LACTATED RINGERS IV SOLN: INTRAVENOUS | Status: DC

## 2023-02-12 MED ORDER — FENTANYL CITRATE PF 50 MCG/ML IJ SOSY
25.0000 ug | PREFILLED_SYRINGE | INTRAMUSCULAR | Status: DC | PRN
  Administered 2023-02-12: 50 ug via INTRAVENOUS

## 2023-02-12 MED ORDER — TRAMADOL HCL 50 MG PO TABS: 50.0000 mg | ORAL_TABLET | Freq: Four times a day (QID) | ORAL | 0 refills | Status: DC | PRN

## 2023-02-12 MED ORDER — BUPIVACAINE HCL (PF) 0.5 % IJ SOLN
INTRAMUSCULAR | Status: AC
  Filled 2023-02-12: qty 30

## 2023-02-12 MED ORDER — ROCURONIUM BROMIDE 10 MG/ML (PF) SYRINGE
PREFILLED_SYRINGE | INTRAVENOUS | Status: AC
  Filled 2023-02-12: qty 10

## 2023-02-12 MED ORDER — ACETAMINOPHEN 500 MG PO TABS
1000.0000 mg | ORAL_TABLET | ORAL | Status: AC
  Administered 2023-02-12: 1000 mg via ORAL
  Filled 2023-02-12: qty 2

## 2023-02-12 MED ORDER — FENTANYL CITRATE PF 50 MCG/ML IJ SOSY
PREFILLED_SYRINGE | INTRAMUSCULAR | Status: AC
  Filled 2023-02-12: qty 1

## 2023-02-12 MED ORDER — MIDAZOLAM HCL 2 MG/2ML IJ SOLN
INTRAMUSCULAR | Status: AC
  Filled 2023-02-12: qty 2

## 2023-02-12 SURGICAL SUPPLY — 28 items
BAG COUNTER SPONGE SURGICOUNT (BAG) IMPLANT
BLADE SURG 15 STRL LF DISP TIS (BLADE) ×1 IMPLANT
CHLORAPREP W/TINT 26 (MISCELLANEOUS) ×1 IMPLANT
COVER SURGICAL LIGHT HANDLE (MISCELLANEOUS) ×1 IMPLANT
DERMABOND ADVANCED .7 DNX12 (GAUZE/BANDAGES/DRESSINGS) ×1 IMPLANT
DRAIN PENROSE 0.5X18 (DRAIN) IMPLANT
DRAPE LAPAROSCOPIC ABDOMINAL (DRAPES) ×1 IMPLANT
ELECT PENCIL ROCKER SW 15FT (MISCELLANEOUS) ×1 IMPLANT
ELECT REM PT RETURN 15FT ADLT (MISCELLANEOUS) ×1 IMPLANT
GAUZE 4X4 16PLY ~~LOC~~+RFID DBL (SPONGE) ×1 IMPLANT
GLOVE BIO SURGEON STRL SZ7.5 (GLOVE) ×1 IMPLANT
GOWN STRL REUS W/ TWL XL LVL3 (GOWN DISPOSABLE) ×1 IMPLANT
KIT BASIN OR (CUSTOM PROCEDURE TRAY) ×1 IMPLANT
KIT TURNOVER KIT A (KITS) IMPLANT
MARKER SKIN DUAL TIP RULER LAB (MISCELLANEOUS) ×1 IMPLANT
MESH PARIETEX PROGRIP RIGHT (Mesh General) IMPLANT
NDL HYPO 22X1.5 SAFETY MO (MISCELLANEOUS) ×1 IMPLANT
NEEDLE HYPO 22X1.5 SAFETY MO (MISCELLANEOUS) ×1 IMPLANT
PACK BASIC VI WITH GOWN DISP (CUSTOM PROCEDURE TRAY) ×1 IMPLANT
SPIKE FLUID TRANSFER (MISCELLANEOUS) IMPLANT
SUT MNCRL AB 4-0 PS2 18 (SUTURE) ×1 IMPLANT
SUT SILK 2 0 SH (SUTURE) IMPLANT
SUT SILK 2-0 30XBRD TIE 12 (SUTURE) IMPLANT
SUT VIC AB 2-0 CT1 TAPERPNT 27 (SUTURE) ×2 IMPLANT
SUT VIC AB 3-0 54XBRD REEL (SUTURE) IMPLANT
SUT VIC AB 3-0 SH 27XBRD (SUTURE) ×1 IMPLANT
SYR CONTROL 10ML LL (SYRINGE) ×1 IMPLANT
TOWEL OR 17X26 10 PK STRL BLUE (TOWEL DISPOSABLE) ×1 IMPLANT

## 2023-02-12 NOTE — Interval H&P Note (Signed)
History and Physical Interval Note:no change in H and P  02/12/2023 8:01 AM  Colin Mcfarland  has presented today for surgery, with the diagnosis of Right Inguinal Hernia.  The various methods of treatment have been discussed with the patient and family. After consideration of risks, benefits and other options for treatment, the patient has consented to  Procedure(s): OPEN RIGHT INGUINALHERNIA REPAIR WITH MESH (Right) as a surgical intervention.  The patient's history has been reviewed, patient examined, no change in status, stable for surgery.  I have reviewed the patient's chart and labs.  Questions were answered to the patient's satisfaction.     Abigail Miyamoto

## 2023-02-12 NOTE — Transfer of Care (Signed)
Immediate Anesthesia Transfer of Care Note  Patient: Colin Mcfarland  Procedure(s) Performed: OPEN RIGHT INGUINALHERNIA REPAIR WITH MESH (Right)  Patient Location: PACU  Anesthesia Type:General  Level of Consciousness: awake, alert , oriented, and patient cooperative  Airway & Oxygen Therapy: Patient connected to face mask oxygen  Post-op Assessment: Report given to RN and Post -op Vital signs reviewed and stable  Post vital signs: stable  Last Vitals:  Vitals Value Taken Time  BP 144/85 02/12/23 0924  Temp    Pulse 80 02/12/23 0925  Resp 12 02/12/23 0925  SpO2 98 % 02/12/23 0925  Vitals shown include unfiled device data.  Last Pain:  Vitals:   02/12/23 0701  TempSrc:   PainSc: 1       Patients Stated Pain Goal: 0 (02/12/23 0701)  Complications: No notable events documented.

## 2023-02-12 NOTE — Anesthesia Postprocedure Evaluation (Signed)
Anesthesia Post Note  Patient: Colin Mcfarland  Procedure(s) Performed: OPEN RIGHT INGUINALHERNIA REPAIR WITH MESH (Right)     Patient location during evaluation: PACU Anesthesia Type: General Level of consciousness: awake and alert Pain management: pain level controlled Vital Signs Assessment: post-procedure vital signs reviewed and stable Respiratory status: spontaneous breathing, nonlabored ventilation, respiratory function stable and patient connected to nasal cannula oxygen Cardiovascular status: blood pressure returned to baseline and stable Postop Assessment: no apparent nausea or vomiting Anesthetic complications: no  No notable events documented.  Last Vitals:  Vitals:   02/12/23 1000 02/12/23 1015  BP: (!) 167/84 (!) 166/71  Pulse: 76 73  Resp: 11 14  Temp:  36.6 C  SpO2: 98% 96%    Last Pain:  Vitals:   02/12/23 1015  TempSrc:   PainSc: 3                  Kennieth Rad

## 2023-02-12 NOTE — Anesthesia Procedure Notes (Signed)
Procedure Name: Intubation Date/Time: 02/12/2023 8:38 AM  Performed by: Donna Bernard, CRNAPre-anesthesia Checklist: Patient identified, Emergency Drugs available, Suction available, Patient being monitored and Timeout performed Patient Re-evaluated:Patient Re-evaluated prior to induction Oxygen Delivery Method: Circle system utilized Preoxygenation: Pre-oxygenation with 100% oxygen Induction Type: IV induction Ventilation: Mask ventilation without difficulty Laryngoscope Size: Miller and 2 Grade View: Grade II Tube type: Oral Tube size: 7.5 mm Number of attempts: 1 Airway Equipment and Method: Stylet Placement Confirmation: positive ETCO2, ETT inserted through vocal cords under direct vision, CO2 detector and breath sounds checked- equal and bilateral Secured at: 23 cm Tube secured with: Tape Dental Injury: Teeth and Oropharynx as per pre-operative assessment

## 2023-02-12 NOTE — Discharge Instructions (Signed)
[facility] _______[facility]  UMBILICAL OR INGUINAL HERNIA REPAIR: POST OP INSTRUCTIONS  Always review your discharge instruction sheet given to you by the facility where your surgery was performed. IF YOU HAVE DISABILITY OR FAMILY LEAVE FORMS, YOU MUST BRING THEM TO THE OFFICE FOR PROCESSING.   DO NOT GIVE THEM TO YOUR DOCTOR.  1. A  prescription for pain medication may be given to you upon discharge.  Take your pain medication as prescribed, if needed.  If narcotic pain medicine is not needed, then you may take acetaminophen (Tylenol) or ibuprofen (Advil) as needed. 2. Take your usually prescribed medications unless otherwise directed. If you need a refill on your pain medication, please contact your pharmacy.  They will contact our office to request authorization. Prescriptions will not be filled after 5 pm or on week-ends. 3. You should follow a light diet the first 24 hours after arrival home, such as soup and crackers, etc.  Be sure to include lots of fluids daily.  Resume your normal diet the day after surgery. 4.Most patients will experience some swelling and bruising around the umbilicus or in the groin and scrotum.  Ice packs and reclining will help.  Swelling and bruising can take several days to resolve.  6. It is common to experience some constipation if taking pain medication after surgery.  Increasing fluid intake and taking a stool softener (such as Colace) will usually help or prevent this problem from occurring.  A mild laxative (Milk of Magnesia or Miralax) should be taken according to package directions if there are no bowel movements after 48 hours. 7. Unless discharge instructions indicate otherwise, you may remove your bandages 24-48 hours after surgery, and you may shower at that time.  You may have steri-strips (small skin tapes) in place directly over the incision.  These strips should be left on the skin for 7-10 days.  If your surgeon used skin glue on the  incision, you may shower in 24 hours.  The glue will flake off over the next 2-3 weeks.  Any sutures or staples will be removed at the office during your follow-up visit. 8. ACTIVITIES:  You may resume regular (light) daily activities beginning the next day--such as daily self-care, walking, climbing stairs--gradually increasing activities as tolerated.  You may have sexual intercourse when it is comfortable.  Refrain from any heavy lifting or straining until approved by your doctor.  a.You may drive when you are no longer taking prescription pain medication, you can comfortably wear a seatbelt, and you can safely maneuver your car and apply brakes. b.RETURN TO WORK:   _____________________________________________  9.You should see your doctor in the office for a follow-up appointment approximately 2-3 weeks after your surgery.  Make sure that you call for this appointment within a day or two after you arrive home to insure a convenient appointment time. 10.OTHER INSTRUCTIONS: YOU MAY SHOWER STARTING TOMORROW ICE PACK AND TYLENOL ALSO FOR PAIN NO LIFTING MORE THAN 15 POUNDS FOR 4 WEEKS    _____________________________________  WHEN TO CALL YOUR DOCTOR: Fever over 101.0 Inability to urinate Nausea and/or vomiting Extreme swelling or bruising Continued bleeding from incision. Increased pain, redness, or drainage from the incision  The clinic staff is available to answer your questions during regular business hours.  Please don't hesitate to call and ask to speak to one of the nurses for clinical concerns.  If you have a medical emergency, go to the nearest emergency room or call 911.  A  surgeon from [facility] is always on call at the hospital   [address], [address], [place], [place]  [address] ?  [address] [address], [place], [place]   [address] [phone] ? [phone] ? FAX [phone] Web site: [email or web address]

## 2023-02-12 NOTE — Anesthesia Preprocedure Evaluation (Signed)
Anesthesia Evaluation  Patient identified by MRN, date of birth, ID band Patient awake    Reviewed: Allergy & Precautions, NPO status , Patient's Chart, lab work & pertinent test results  Airway Mallampati: II  TM Distance: >3 FB Neck ROM: Full    Dental   Pulmonary sleep apnea    breath sounds clear to auscultation       Cardiovascular hypertension, Pt. on medications  Rhythm:Regular Rate:Normal     Neuro/Psych negative neurological ROS     GI/Hepatic Neg liver ROS,GERD  ,,  Endo/Other  Hypothyroidism    Renal/GU Renal disease (s/p renal transplant)     Musculoskeletal   Abdominal   Peds  Hematology  (+) Blood dyscrasia, anemia   Anesthesia Other Findings   Reproductive/Obstetrics                             Anesthesia Physical Anesthesia Plan  ASA: 3  Anesthesia Plan: General   Post-op Pain Management: Tylenol PO (pre-op)*   Induction: Intravenous  PONV Risk Score and Plan: 2 and Dexamethasone, Treatment may vary due to age or medical condition, Ondansetron and Midazolam  Airway Management Planned: Oral ETT and LMA  Additional Equipment:   Intra-op Plan:   Post-operative Plan: Extubation in OR  Informed Consent: I have reviewed the patients History and Physical, chart, labs and discussed the procedure including the risks, benefits and alternatives for the proposed anesthesia with the patient or authorized representative who has indicated his/her understanding and acceptance.     Dental advisory given  Plan Discussed with: CRNA  Anesthesia Plan Comments:        Anesthesia Quick Evaluation

## 2023-02-12 NOTE — Op Note (Signed)
OPEN RIGHT INGUINALHERNIA REPAIR WITH MESH  Procedure Note  Colin Mcfarland 02/12/2023   Pre-op Diagnosis: Right Inguinal Hernia     Post-op Diagnosis: same  Procedure(s): OPEN RIGHT INGUINALHERNIA REPAIR WITH MESH  Surgeon(s): Abigail Miyamoto, MD  Anesthesia: General  Staff:  Circulator: Lind Guest, RN Scrub Person: Kem Parkinson, RN; Earnest Conroy  Estimated Blood Loss: Minimal               Findings: The patient was found to have a direct right inguinal hernia which was repaired with a large piece of Prolene ProGrip mesh from [employer]  Procedure: The patient was brought to the operating room and identified the correct patient.  He is placed upon on the operating table and general anesthesia was induced.  His abdomen was prepped and draped in the usual sterile fashion.  I anesthetized the skin in the right inguinal area with Marcaine and then made a longitudinal incision with a scalpel.  I carried this down through Scarpa's fascia with the cautery.  I then identified the external oblique fascia and opened it toward the internal and external ring.  The testicular cord and structures were controlled with a Penrose drain.  There was no evidence of indirect hernia.  He had a large direct hernia sac.  I reduced the sac and then imbricated the floor of the inguinal canal over the top of the sac with interrupted 2-0 silk sutures.  There were no contents in the sac.  I then brought a large piece of Prolene ProGrip mesh onto the field.  I placed it against the pubic tubercle and then brought around the cord structures and internal ring.  I then sutured the mesh in place with interrupted 2-0 Vicryl sutures to the pubic tubercle, the transversalis fascia, and the shelving edge of the inguinal ligament.  Wide coverage of the inguinal floor and internal ring appeared to be achieved.  1 small bridging vein was tied off with 2-0 silk sutures.  Hemostasis appeared to be achieved.  I then  closed the external oblique fascia over the top of the mesh with a running 2-0 Vicryl suture.  Scarpa's fascia was closed with interrupted 3-0 Vicryl sutures and the skin was closed with a running 4-0 Monocryl.  Dermabond was then applied.  The patient tolerated the procedure well.  All the counts were correct at the end of the procedure.  The patient was then extubated in the operating room and taken in a stable condition to the recovery room.          Abigail Miyamoto   Date: 02/12/2023  Time: 9:17 AM

## 2023-02-13 ENCOUNTER — Encounter (HOSPITAL_COMMUNITY): Payer: Self-pay | Admitting: Surgery

## 2023-04-14 ENCOUNTER — Telehealth: Payer: Self-pay | Admitting: *Deleted

## 2023-04-14 NOTE — Telephone Encounter (Signed)
   Pre-operative Risk Assessment    Patient Name: Colin Mcfarland  DOB: Jul 04, 1965 MRN: [id number]   Date of last office visit: 09/01/2020 Date of next office visit: N/A   Request for Surgical Clearance    Procedure:   RIGHT TOTAL HIP ARTHROPASTY  Date of Surgery:  Clearance TBD                                Surgeon:  DR. Samson Frederic Surgeon's Group or Practice Name:  [facility] Phone number:  [phone] Fax number:  [phone]   Type of Clearance Requested:   - Medical  - Pharmacy:  Hold Aspirin NOT INDICATED   Type of Anesthesia:  Spinal   Additional requests/questions:    Wilhemina Cash   04/14/2023, 2:10 PM

## 2023-04-15 NOTE — Telephone Encounter (Signed)
 S/w the pt and he said he only wants to see Dr. Eden Emms for appt for preop clearance. Pt said his surgery is not scheduled yet and he prefers to see MD as it has been since 08/2020.   Pt has been scheduled 07/22/23 Dr. Eden Emms.

## 2023-04-15 NOTE — Telephone Encounter (Signed)
 CORRECTION ON CLEARANCE FORM; CORRECT FAX # [phone]

## 2023-04-15 NOTE — Telephone Encounter (Signed)
   Name: Colin Mcfarland  DOB: Dec 25, 1965  MRN: [id number]  Primary Cardiologist: None  Chart reviewed as part of pre-operative protocol coverage. Because of Raymon Kaylor's past medical history and time since last visit, he will require a follow-up in-office visit in order to better assess preoperative cardiovascular risk.  Pre-op covering staff: - Please schedule appointment and call patient to inform them. If patient already had an upcoming appointment within acceptable timeframe, please add "pre-op clearance" to the appointment notes so provider is aware. - Please contact requesting surgeon's office via preferred method (i.e, phone, fax) to inform them of need for appointment prior to surgery.  Aspirin hold recommendations will be made at the time of in person appointment.  Sharlene Dory, PA-C  04/15/2023, 11:43 AM

## 2023-06-08 ENCOUNTER — Other Ambulatory Visit: Payer: Self-pay

## 2023-06-08 ENCOUNTER — Emergency Department (HOSPITAL_COMMUNITY)
Admission: EM | Admit: 2023-06-08 | Discharge: 2023-06-08 | Disposition: A | Discharge status: Home / Self Care | Attending: Emergency Medicine | Admitting: Emergency Medicine

## 2023-06-08 ENCOUNTER — Emergency Department (HOSPITAL_COMMUNITY)

## 2023-06-08 ENCOUNTER — Encounter (HOSPITAL_COMMUNITY): Payer: Self-pay

## 2023-06-08 DIAGNOSIS — Z794 Long term (current) use of insulin: Secondary | ICD-10-CM | POA: Diagnosis not present

## 2023-06-08 DIAGNOSIS — R739 Hyperglycemia, unspecified: Secondary | ICD-10-CM | POA: Diagnosis not present

## 2023-06-08 DIAGNOSIS — Z7982 Long term (current) use of aspirin: Secondary | ICD-10-CM | POA: Insufficient documentation

## 2023-06-08 DIAGNOSIS — E871 Hypo-osmolality and hyponatremia: Secondary | ICD-10-CM | POA: Insufficient documentation

## 2023-06-08 DIAGNOSIS — Z940 Kidney transplant status: Secondary | ICD-10-CM | POA: Diagnosis not present

## 2023-06-08 DIAGNOSIS — U071 COVID-19: Secondary | ICD-10-CM | POA: Insufficient documentation

## 2023-06-08 DIAGNOSIS — R059 Cough, unspecified: Secondary | ICD-10-CM | POA: Diagnosis present

## 2023-06-08 LAB — CBC WITH DIFFERENTIAL/PLATELET
Abs Immature Granulocytes: 0.22 10*3/uL — ABNORMAL HIGH (ref 0.00–0.07)
Basophils Absolute: 0.1 10*3/uL (ref 0.0–0.1)
Basophils Relative: 1 %
Eosinophils Absolute: 0.1 10*3/uL (ref 0.0–0.5)
Eosinophils Relative: 1 %
HCT: 39.1 % (ref 39.0–52.0)
Hemoglobin: 13.5 g/dL (ref 13.0–17.0)
Immature Granulocytes: 2 %
Lymphocytes Relative: 5 %
Lymphs Abs: 0.5 10*3/uL — ABNORMAL LOW (ref 0.7–4.0)
MCH: 28.1 pg (ref 26.0–34.0)
MCHC: 34.5 g/dL (ref 30.0–36.0)
MCV: 81.5 fL (ref 80.0–100.0)
Monocytes Absolute: 1.1 10*3/uL — ABNORMAL HIGH (ref 0.1–1.0)
Monocytes Relative: 12 %
Neutro Abs: 7.2 10*3/uL (ref 1.7–7.7)
Neutrophils Relative %: 79 %
Platelets: 185 10*3/uL (ref 150–400)
RBC: 4.8 MIL/uL (ref 4.22–5.81)
RDW: 12.7 % (ref 11.5–15.5)
WBC: 9.1 10*3/uL (ref 4.0–10.5)
nRBC: 0 % (ref 0.0–0.2)

## 2023-06-08 LAB — COMPREHENSIVE METABOLIC PANEL WITH GFR
ALT: 17 U/L (ref 0–44)
AST: 23 U/L (ref 15–41)
Albumin: 4.1 g/dL (ref 3.5–5.0)
Alkaline Phosphatase: 57 U/L (ref 38–126)
Anion gap: 10 (ref 5–15)
BUN: 13 mg/dL (ref 6–20)
CO2: 20 mmol/L — ABNORMAL LOW (ref 22–32)
Calcium: 9.5 mg/dL (ref 8.9–10.3)
Chloride: 94 mmol/L — ABNORMAL LOW (ref 98–111)
Creatinine, Ser: 0.8 mg/dL (ref 0.61–1.24)
GFR, Estimated: >60 mL/min (ref >60)
Glucose, Bld: 130 mg/dL — ABNORMAL HIGH (ref 70–99)
Potassium: 4.2 mmol/L (ref 3.5–5.1)
Sodium: 124 mmol/L — ABNORMAL LOW (ref 135–145)
Total Bilirubin: 1.3 mg/dL — ABNORMAL HIGH (ref 0.0–1.2)
Total Protein: 6.6 g/dL (ref 6.5–8.1)

## 2023-06-08 LAB — I-STAT CG4 LACTIC ACID, ED: Lactic Acid, Venous: 0.6 mmol/L (ref 0.5–1.9)

## 2023-06-08 LAB — SARS CORONAVIRUS 2 BY RT PCR: SARS Coronavirus 2 by RT PCR: POSITIVE — AB

## 2023-06-08 NOTE — ED Triage Notes (Signed)
 Pt POV d/t fever and testing Positive for COVID at home.  Pt states he has been sick for 2 weeks and pt placed on him on Cefdinir.  Pt is also a Kidney transplant pt from [facility] and was tokd to come be evaluated .  Pt took 500 mg of Tylenol  at home at 2200 after having 101.3.

## 2023-06-08 NOTE — Discharge Instructions (Addendum)
 You may take acetaminophen  as needed for fever or aching.  You may take Robitussin DM as needed for cough.  Continue taking prednisone  5 mg every day.  Continue taking your tacrolimus .  Return to the emergency department if you develop a fever greater than 102, start getting short of breath, start vomiting, or if you start getting confused.  If you are not improving by Tuesday 06/10/2023, contact the transplant team at [facility].

## 2023-06-08 NOTE — ED Provider Notes (Signed)
 [place] EMERGENCY DEPARTMENT AT [facility] Provider Note   CSN: [id number] Arrival date & time: 06/08/23  0036     History  Chief complaint: COVID-19  Colin Mcfarland is a 58 y.o. male.  The history is provided by the patient.  He has history of IgA nephropathy status post renal transplant and comes in because of fever tonight.  He has been sick with what he calls a flulike illness for about the last 15 days which was treated with a course of cefdinir which she just completed today.  He has had a mild cough which is nonproductive, low-grade fevers with associated chills and sweats.  He denies arthralgias or myalgias and denies nausea or vomiting.  A home COVID-19 test was positive.  Tonight, temperature went up to 101.3 and he stated his oxygen saturation had dropped down to 90% so he came in for further evaluation.   Home Medications Prior to Admission medications   Medication Sig Start Date End Date Taking? Authorizing Provider  acetaminophen  (TYLENOL ) 325 MG tablet Take 325 mg by mouth every 6 (six) hours as needed (Body pain).    [provider]  ALPRAZolam (XANAX) 0.25 MG tablet Take 0.25 mg by mouth daily as needed (Mental Health). 07/26/15   [provider]  amoxicillin (AMOXIL) 500 MG capsule Take 500 mg by mouth as needed. With Dental procedures    [provider]  aspirin EC 81 MG tablet Take 81 mg by mouth daily.  02/05/18   [provider]  Blood Glucose Monitoring Suppl (ONETOUCH VERIO) w/Device KIT  02/09/18   [provider]  clobetasol ointment (TEMOVATE) 0.05 % Apply 1 Application topically daily as needed (Eczema).    [provider]  Insulin  Syringe-Needle U-100 31G X 15/64" 0.3 ML MISC Use 1 Syringe 3 (three) times daily before meals 02/19/18   [provider]  Lancets [facility] DELICA PLUS [place]) MISC  02/27/18   [provider]  lansoprazole  (PREVACID ) 30 MG capsule Take 1 capsule (30 mg  total) by mouth daily at 12 noon. Call [phone] for an office visit for more refills Patient taking differently: Take 30 mg by mouth daily as needed (Gi issues). Call [phone] for an office visit for more refills 03/05/21   Colin Pila, MD  levothyroxine (SYNTHROID) 137 MCG tablet Take 137 mcg by mouth daily.    [provider]  mycophenolate  (CELLCEPT ) 250 MG capsule Take 1,000 mg by mouth 2 (two) times daily.  02/09/18   [provider]  ONETOUCH VERIO test strip  02/27/18   [provider]  polyethylene glycol (MIRALAX / GLYCOLAX) 17 g packet Take 17 g by mouth daily.    [provider]  pravastatin (PRAVACHOL) 20 MG tablet Take 20 mg by mouth daily.    [provider]  predniSONE  (DELTASONE ) 5 MG tablet Take 4 tablets x2 days, 3 tablets x2 days, 2 tablets x2 days, 1 tablet x2 days. Patient taking differently: Take 5 mg by mouth daily. Take 4 tablets x2 days, 3 tablets x2 days, 2 tablets x2 days, 1 tablet x2 days. 05/27/17   Nicholas Bari, MD  sitaGLIPtin (JANUVIA) 100 MG tablet Take 100 mg by mouth daily. 03/29/19   [provider]  tacrolimus  (PROGRAF ) 0.5 MG capsule Take 1 mg by mouth 2 (two) times daily.    [provider]  tamsulosin (FLOMAX) 0.4 MG CAPS capsule TAKE 1 CAPSULE BY MOUTH EVERY  NIGHT AT BEDTIME 01/13/23   Colin Mcfarland  L, MD  traMADol  (ULTRAM ) 50 MG tablet Take 50 mg by mouth every 6 (six) hours as needed (Back pain).    [provider]  traMADol  (ULTRAM ) 50 MG tablet Take 1 tablet (50 mg total) by mouth every 6 (six) hours as needed for moderate pain (pain score 4-6) or severe pain (pain score 7-10). 02/12/23   Oza Blumenthal, MD      Allergies    Blue dyes (parenteral), Escitalopram, Jardiance [empagliflozin], Olmesartan, Rosuvastatin, Uloric [febuxostat], Alatrofloxacin, Paricalcitol, Sevelamer, and Sulfa antibiotics    Review of Systems   Review of Systems  All other systems reviewed  and are negative.   Physical Exam Updated Vital Signs BP (!) 167/79   Pulse 84   Temp 98.3 F (36.8 C) (Oral)   Resp 16   Ht 5\' 7"  (1.702 m)   Wt 71.2 kg   SpO2 100%   BMI 24.58 kg/m  Physical Exam Vitals and nursing note reviewed.   57 year old male, resting comfortably and in no acute distress. Vital signs are significant for elevated blood pressure. Oxygen saturation is 100%, which is normal. Head is normocephalic and atraumatic. PERRLA, EOMI.  Lungs are clear without rales, wheezes, or rhonchi. Chest is nontender. Heart has regular rate and rhythm with 2/6 systolic ejection murmur present. Abdomen is soft, flat, nontender.  Transplanted kidney is present in the right lower quadrant and is nontender. Extremities have no cyanosis or edema, full range of motion is present. Skin is warm and dry without rash. Neurologic: Mental status is normal, moves all extremities equally.  ED Results / Procedures / Treatments   Labs (all labs ordered are listed, but only abnormal results are displayed) Labs Reviewed  SARS CORONAVIRUS 2 BY RT PCR - Abnormal; Notable for the following components:      Result Value   SARS Coronavirus 2 by RT PCR POSITIVE (*)    All other components within normal limits  CBC WITH DIFFERENTIAL/PLATELET - Abnormal; Notable for the following components:   Lymphs Abs 0.5 (*)    Monocytes Absolute 1.1 (*)    Abs Immature Granulocytes 0.22 (*)    All other components within normal limits  COMPREHENSIVE METABOLIC PANEL WITH GFR - Abnormal; Notable for the following components:   Sodium 124 (*)    Chloride 94 (*)    CO2 20 (*)    Glucose, Bld 130 (*)    Total Bilirubin 1.3 (*)    All other components within normal limits  I-STAT CG4 LACTIC ACID, ED  I-STAT CG4 LACTIC ACID, ED    EKG None  Radiology DG Chest Port 1 View Result Date: 06/08/2023 CLINICAL DATA:  COVID EXAM: PORTABLE CHEST 1 VIEW COMPARISON:  08/28/2016 FINDINGS: Stable cardiomediastinal  silhouette. Patchy interstitial and ground-glass opacities greatest in the left mid and lower lung are nonspecific but compatible with viral infection. No pleural effusion or pneumothorax. No displaced rib fracture. IMPRESSION: Patchy interstitial and ground-glass opacities greatest in the left mid and lower lung are nonspecific but compatible with viral infection. Electronically Signed   By: Rozell Cornet M.D.   On: 06/08/2023 02:23    Procedures Procedures     Medications Ordered in ED Medications - No data to display  ED Course/ Medical Decision Making/ A&P                                 Medical Decision Making Amount and/or Complexity of  Data Reviewed Labs: ordered. Radiology: ordered.   Influenza-like illness with positive home test for COVID-19.  He has been symptomatic for 15 days, he is outside the window for initiating antiviral therapy.  Oxygen saturation is adequate in the emergency department I have reviewed his laboratory tests, and my interpretation is hyponatremia which is somewhat worse than baseline, elevated random glucose level, borderline elevated random glucose level which is not felt to be clinically significant, normal BUN and creatinine, normal lactic acid level, normal CBC.  I have ordered a chest x-ray.  Viral testing is pending.  Chest x-ray shows patchy interstitial and groundglass opacities greatest in the left mid and lower lung.  Findings are nonspecific, but compatible with viral infection.  I have independently viewed the image, and agree with the radiologist's interpretation.  COVID-19 PCR is positive.  I have discussed the case with Dr. Lauretta Ponto of the [facility] renal transplant team and discussed the patient's x-rays and laboratory findings.  He is doing well from a clinical standpoint.  Decision made was to discharge him home.  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  No indication for antibiotics.  Recommendation was to continue his current dose of prednisone  and tacrolimus .  I have instructed him to return should he develop significant fever, persistent vomiting, mental status change, dyspnea.  Transplant team has requested he contact them if he is not showing improvement over the next 2 days.  Final Clinical Impression(s) / ED Diagnoses Final diagnoses:  COVID-19 virus infection  History of renal transplant  Hyponatremia  Elevated random blood glucose level    Rx / DC Orders ED Discharge Orders     None         Alissa April, MD 06/08/23 [phone]

## 2023-07-14 NOTE — Progress Notes (Signed)
 Cardiology Office Note   Date:  07/22/2023   ID:  Colin Mcfarland, DOB 05-18-1965, MRN [id number]  PCP:  Janey Santos, MD  Cardiologist:   Last seen 2022   Preoperative Clearance     History of Present Illness: Colin Mcfarland is a 58 y.o. male who presents for preoperative clearance. Needs right THR with Dr Cherylene. .  Last seen over 3 years ago Referred by Avva Ravisanka. Seen by me 7 years ago for bradycardia and near syncope during LUE fistula placement. 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   06/08/23 seen in ED with Covid CXR abnormal with patchy interstitial /ground glass opacities in left mid/lower lung Had fever and Rx cefdinir prior to EF visit  Symptomatic 15 days no anti viral given ? Low sodium from SIADH/virus. [facility] transplant team contacted by ER   He has no chest pain dyspnea improving after covid. 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 Internal Hemorrhoids. Otherwise normal colonoscopy to terminal ileum   HEMORRHOID SURGERY     INGUINAL HERNIA REPAIR Right 02/12/2023   Procedure: OPEN RIGHT INGUINALHERNIA REPAIR WITH MESH;  Surgeon: Vernetta Berg, MD;  Location: [facility] ORS;  Service: General;  Laterality: Right;   KIDNEY TRANSPLANT  2020   RENAL BIOPSY     SHOULDER SURGERY     UPPER GI ENDOSCOPY       Current Outpatient Medications  Medication Sig Dispense Refill   acetaminophen  (TYLENOL ) 325 MG tablet Take 325 mg by mouth every 6 (six) hours as needed (Body pain).     ALPRAZolam (XANAX) 0.25 MG tablet Take 0.25 mg by mouth daily as needed (Mental Health).     amoxicillin (AMOXIL) 500 MG capsule Take 500 mg by mouth as needed. With Dental procedures     aspirin EC 81 MG tablet Take 81 mg by mouth daily.      Blood Glucose Monitoring Suppl (ONETOUCH VERIO) w/Device KIT      clobetasol ointment (TEMOVATE) 0.05 % Apply 1 Application topically daily  as needed (Eczema).     Insulin  Syringe-Needle U-100 31G X 15/64 0.3 ML MISC Use 1 Syringe 3 (three) times daily before meals     Lancets (ONETOUCH DELICA PLUS LANCET33G) MISC      lansoprazole  (PREVACID ) 30 MG capsule Take 1 capsule (30 mg total) by mouth daily at 12 noon. Call [phone] for an office visit for more refills (Patient taking differently: Take 30 mg by mouth daily as needed (Gi issues). Call [phone] for an office visit for more refills) 90 capsule 0   levothyroxine (SYNTHROID) 137 MCG tablet Take 137 mcg by mouth daily.     mycophenolate  (CELLCEPT ) 250 MG capsule Take 1,000 mg by mouth 2 (two) times daily.      ONETOUCH VERIO test strip      polyethylene glycol (MIRALAX / GLYCOLAX) 17 g packet Take 17 g by mouth  daily.     pravastatin (PRAVACHOL) 20 MG tablet Take 20 mg by mouth daily.     predniSONE  (DELTASONE ) 5 MG tablet Take 4 tablets x2 days, 3 tablets x2 days, 2 tablets x2 days, 1 tablet x2 days. (Patient taking differently: Take 5 mg by mouth daily. Take 4 tablets x2 days, 3 tablets x2 days, 2 tablets x2 days, 1 tablet x2 days.) 20 tablet 0   sitaGLIPtin (JANUVIA) 100 MG tablet Take 100 mg by mouth daily.     tacrolimus  (PROGRAF ) 0.5 MG capsule Take 1 mg by mouth 2 (two) times daily.     tamsulosin (FLOMAX) 0.4 MG CAPS capsule TAKE 1 CAPSULE BY MOUTH EVERY  NIGHT AT BEDTIME 90 capsule 3   traMADol  (ULTRAM ) 50 MG tablet Take 50 mg by mouth every 6 (six) hours as needed (Back pain).     traMADol  (ULTRAM ) 50 MG tablet Take 1 tablet (50 mg total) by mouth every 6 (six) hours as needed for moderate pain (pain score 4-6) or severe pain (pain score 7-10). 25 tablet 0   No current facility-administered medications for this visit.    Allergies:   Blue dyes (parenteral), Escitalopram, Jardiance [empagliflozin], Metformin, Olmesartan, Rosuvastatin, Uloric [febuxostat], Alatrofloxacin, Paricalcitol, Sevelamer, and Sulfa antibiotics    Social History:  The patient  reports that he has never smoked. He has never used smokeless tobacco. He reports that he does not drink alcohol and does not use drugs.   Family History:  The patient's family history includes Cancer in his mother; Diabetes in his brother and father; Hypertension in his father.    ROS:  Please see the history of present illness.   Otherwise, review of systems are positive for none.   All other systems are reviewed and negative.    PHYSICAL EXAM: VS:  BP 130/80 (BP Location: Right Arm, Patient Position: Sitting, Cuff Size: Normal)   Pulse 79   Ht 5' 7 (1.702 m)   Wt 158 lb 12.8 oz (72 kg)   SpO2 98%   BMI 24.87 kg/m  , BMI Body mass index is 24.87 kg/m. Affect appropriate Healthy:  appears stated age HEENT: normal Neck supple with no  adenopathy JVP normal no bruits no thyromegaly Lungs clear with no wheezing and good diaphragmatic motion Heart:  S1/S2 AV sclerosis  murmur, no rub, gallop or click PMI normal Abdomen: benighn, post renal transplant  Distal pulses intact with no bruits No edema Neuro non-focal Skin warm and dry No muscular weakness Huge fistula in LUE    EKG:  07/22/2023 SR rate 79 normal    Recent Labs: 06/08/2023: ALT 17;  BUN 13; Creatinine, Ser 0.80; Hemoglobin 13.5; Platelets 185; Potassium 4.2; Sodium 124    Lipid Panel No results found for: CHOL, TRIG, HDL, CHOLHDL, VLDL, LDLCALC, LDLDIRECT    Wt Readings from Last 3 Encounters:  07/22/23 158 lb 12.8 oz (72 kg)  06/08/23 156 lb 15.5 oz (71.2 kg)  02/12/23 156 lb 15.5 oz (71.2 kg)      Other studies Reviewed: Additional studies/ records that were reviewed today include: Notes from primary labs ECG 2015 along with old cardiology consult note 2015 ETT and echo .    ASSESSMENT AND PLAN:  1.  AR:  mild/mod by TTE 2023 update His CHF was in setting of renal failure prior to renal transplant  Concern for AR and large LUE fistula causing cardiac enlargement and even high output failure in future Would ask nephrology if it would be useful to ligate or decrease size of fistula 2. HTN:  Well controlled.  Continue current medications and low sodium Dash type diet.   3. CRF:  post transplant immunocompromised f/u nephrology Cr normal continue 3 drug immunosuppression  5. Covid:  tested positive in ER 06/08/23 with abnormal CXR sats ok will update CXR 6. Preoperative:  ok to proceed with right THR with DR Swinteck. EF has been normal no documented CAD and good functional status No issues with surgery / general anesthesia January this year with hernia repair 7. CAD:  risk stratification suggested coronary calcium score.   CXR TTE for AR Coronary calcium score   Current medicines are reviewed at length with the patient today.  The  patient does not have concerns regarding medicines.  The following changes have been made:  no change  Labs/ tests ordered today include: TTE   Orders Placed This Encounter  Procedures   EKG 12-Lead      Disposition:   FU with cardiology in a year if valves stable     Signed, Maude Emmer, MD  07/22/2023 8:44 AM    [facility] [address] [place], [place], [place]  [address] Phone: [phone]; Fax: [phone]

## 2023-07-15 ENCOUNTER — Telehealth: Payer: Self-pay

## 2023-07-15 DIAGNOSIS — U071 COVID-19: Secondary | ICD-10-CM

## 2023-07-15 DIAGNOSIS — I351 Nonrheumatic aortic (valve) insufficiency: Secondary | ICD-10-CM

## 2023-07-15 NOTE — Telephone Encounter (Signed)
 Left message for patient to call back. Will tell him about the test and try to get scheduled before his appointment next week.

## 2023-07-15 NOTE — Telephone Encounter (Signed)
-----   Message from Maude Emmer sent at 07/14/2023  4:06 PM EDT ----- Needs updated CXR for COVID 5/25, echo for AR and calcium score seeing patient next week

## 2023-07-16 ENCOUNTER — Ambulatory Visit: Payer: Self-pay | Admitting: Cardiovascular Disease

## 2023-07-16 ENCOUNTER — Ambulatory Visit (HOSPITAL_COMMUNITY)
Admission: RE | Admit: 2023-07-16 | Discharge: 2023-07-16 | Disposition: A | Discharge status: Home / Self Care | Source: Ambulatory Visit | Attending: Cardiology | Admitting: Cardiology

## 2023-07-16 DIAGNOSIS — U071 COVID-19: Secondary | ICD-10-CM | POA: Diagnosis present

## 2023-07-16 DIAGNOSIS — I351 Nonrheumatic aortic (valve) insufficiency: Secondary | ICD-10-CM | POA: Diagnosis present

## 2023-07-16 LAB — ECHOCARDIOGRAM COMPLETE
Area-P 1/2: 3.31 cm2
P 1/2 time: 301 ms
S' Lateral: 3.4 cm

## 2023-07-22 ENCOUNTER — Ambulatory Visit (HOSPITAL_COMMUNITY)
Admission: RE | Admit: 2023-07-22 | Discharge: 2023-07-22 | Disposition: A | Discharge status: Home / Self Care | Source: Ambulatory Visit | Attending: Cardiovascular Disease | Admitting: Cardiovascular Disease

## 2023-07-22 ENCOUNTER — Encounter: Payer: Self-pay | Admitting: Cardiovascular Disease

## 2023-07-22 ENCOUNTER — Ambulatory Visit: Attending: Cardiology | Admitting: Cardiovascular Disease

## 2023-07-22 VITALS — BP 130/80 | HR 79 | Ht 67.0 in | Wt 158.8 lb

## 2023-07-22 DIAGNOSIS — I10 Essential (primary) hypertension: Secondary | ICD-10-CM | POA: Diagnosis not present

## 2023-07-22 DIAGNOSIS — I351 Nonrheumatic aortic (valve) insufficiency: Secondary | ICD-10-CM | POA: Insufficient documentation

## 2023-07-22 DIAGNOSIS — R011 Cardiac murmur, unspecified: Secondary | ICD-10-CM | POA: Insufficient documentation

## 2023-07-22 DIAGNOSIS — Z01810 Encounter for preprocedural cardiovascular examination: Secondary | ICD-10-CM

## 2023-07-22 NOTE — Patient Instructions (Addendum)
 Medication Instructions:  Your physician recommends that you continue on your current medications as directed. Please refer to the Current Medication list given to you today.  *If you need a refill on your cardiac medications before your next appointment, please call your pharmacy*  Lab Work: If you have labs (blood work) drawn today and your tests are completely normal, you will receive your results only by: MyChart Message (if you have MyChart) OR A paper copy in the mail If you have any lab test that is abnormal or we need to change your treatment, we will call you to review the results.  Testing/Procedures: Your physician has requested that you have an echocardiogram in 1 year. Echocardiography is a painless test that uses sound waves to create images of your heart. It provides your doctor with information about the size and shape of your heart and how well your heart's chambers and valves are working. This procedure takes approximately one hour. There are no restrictions for this procedure. Please do NOT wear cologne, perfume, aftershave, or lotions (deodorant is allowed). Please arrive 15 minutes prior to your appointment time.  Please note: We ask at that you not bring children with you during ultrasound (echo/ vascular) testing. Due to room size and safety concerns, children are not allowed in the ultrasound rooms during exams. Our front office staff cannot provide observation of children in our lobby area while testing is being conducted. An adult accompanying a patient to their appointment will only be allowed in the ultrasound room at the discretion of the ultrasound technician under special circumstances. We apologize for any inconvenience. CT scanning for a cardiac calcium score (CAT scanning), is a noninvasive, special x-ray that produces cross-sectional images of the body using x-rays and a computer. CT scans help physicians diagnose and treat medical conditions. For some CT exams, a  contrast material is used to enhance visibility in the area of the body being studied. CT scans provide greater clarity and reveal more details than regular x-ray exams.   A chest x-ray takes a picture of the organs and structures inside the chest, including the heart, lungs, and blood vessels. This test can show several things, including, whether the heart is enlarges; whether fluid is building up in the lungs; and whether pacemaker / defibrillator leads are still in place.   Follow-Up: At [facility], you and your health needs are our priority.  As part of our continuing mission to provide you with exceptional heart care, our providers are all part of one team.  This team includes your primary Cardiologist (physician) and Advanced Practice Providers or APPs (Physician Assistants and Nurse Practitioners) who all work together to provide you with the care you need, when you need it.  Your next appointment:   1 year(s)  Provider:   Maude Emmer, MD    We recommend signing up for the patient portal called MyChart.  Sign up information is provided on this After Visit Summary.  MyChart is used to connect with patients for Virtual Visits (Telemedicine).  Patients are able to view lab/test results, encounter notes, upcoming appointments, etc.  Non-urgent messages can be sent to your provider as well.   To learn more about what you can do with MyChart, go to [email or web address].

## 2023-07-23 ENCOUNTER — Ambulatory Visit: Payer: Self-pay | Admitting: Cardiovascular Disease

## 2023-07-23 DIAGNOSIS — E785 Hyperlipidemia, unspecified: Secondary | ICD-10-CM

## 2023-08-05 ENCOUNTER — Telehealth: Payer: Self-pay

## 2023-08-05 ENCOUNTER — Ambulatory Visit (HOSPITAL_BASED_OUTPATIENT_CLINIC_OR_DEPARTMENT_OTHER)
Admission: RE | Admit: 2023-08-05 | Discharge: 2023-08-05 | Disposition: A | Discharge status: Home / Self Care | Payer: Self-pay | Source: Ambulatory Visit | Attending: Cardiovascular Disease | Admitting: Cardiovascular Disease

## 2023-08-05 DIAGNOSIS — I351 Nonrheumatic aortic (valve) insufficiency: Secondary | ICD-10-CM | POA: Insufficient documentation

## 2023-08-05 DIAGNOSIS — I10 Essential (primary) hypertension: Secondary | ICD-10-CM | POA: Insufficient documentation

## 2023-08-05 DIAGNOSIS — R011 Cardiac murmur, unspecified: Secondary | ICD-10-CM | POA: Insufficient documentation

## 2023-08-05 NOTE — Telephone Encounter (Signed)
   Pre-operative Risk Assessment    Patient Name: Colin Mcfarland  DOB: 22-Nov-1965 MRN: [id number]   Date of last office visit: 07/22/23 Date of next office visit: n/a   Request for Surgical Clearance    Procedure:  right total hip arthroplasty   Date of Surgery:  Clearance TBD                                 Surgeon:  Dr. Redell Shoals   Surgeon's Group or Practice Name:  [facility]  Phone number:  [phone] Fax number:  [phone]   Type of Clearance Requested:   - Medical  - Pharmacy:  Hold Aspirin Not indicated    Type of Anesthesia:  Spinal   Additional requests/questions:    SignedRebeca Blight   08/05/2023, 5:07 PM

## 2023-08-06 NOTE — Telephone Encounter (Signed)
   Primary Cardiologist: Maude Emmer, MD  Chart reviewed as part of pre-operative protocol coverage. Given past medical history and time since last visit, based on ACC/AHA guidelines, Kyrollos Cordell would be at acceptable risk for the planned procedure without further cardiovascular testing (see notes from Dr. Emmer and cardiac studies completed 07/2023).  Patient should contact our office if he is having new symptoms that are concerning from a cardiac perspective to arrange a follow-up appointment.   Ideally aspirin should be continued without interruption, however if the bleeding risk is too great, aspirin may be held for 5-7 days prior to surgery. Please resume aspirin post operatively when it is felt to be safe from a bleeding standpoint.    I will route this recommendation to the requesting party via Epic fax function and remove from pre-op  pool.  Please call with questions.  Rosaline EMERSON Bane, NP-C 08/06/2023, 7:28 AM [address] [place], [place] [address] Office [phone] Fax [phone]

## 2023-08-27 ENCOUNTER — Other Ambulatory Visit (HOSPITAL_COMMUNITY)

## 2023-11-17 ENCOUNTER — Encounter: Payer: Self-pay | Admitting: Radiology

## 2023-11-18 ENCOUNTER — Other Ambulatory Visit: Payer: Self-pay | Admitting: Urology

## 2023-12-15 ENCOUNTER — Ambulatory Visit: Payer: Self-pay | Admitting: Student

## 2023-12-15 NOTE — H&P (Signed)
 TOTAL HIP ADMISSION H&P  Patient is admitted for right total hip arthroplasty.  Subjective:  Chief Complaint: right hip pain  HPI: Colin Mcfarland, 58 y.o. male, has a history of pain and functional disability in the right hip(s) due to arthritis and patient has failed non-surgical conservative treatments for greater than 12 weeks to include NSAID's and/or analgesics, corticosteriod injections, flexibility and strengthening excercises, use of assistive devices, and activity modification.  Onset of symptoms was gradual starting 10 years ago with rapidlly worsening course since that time.The patient noted no past surgery on the right hip(s).  Patient currently rates pain in the right hip at 10 out of 10 with activity. Patient has night pain, worsening of pain with activity and weight bearing, trendelenberg gait, pain that interfers with activities of daily living, and pain with passive range of motion. Patient has evidence of subchondral cysts, subchondral sclerosis, periarticular osteophytes, and joint space narrowing by imaging studies. This condition presents safety issues increasing the risk of falls.  There is no current active infection.  Patient Active Problem List   Diagnosis Date Noted   Pneumonia 04/28/2017   GERD (gastroesophageal reflux disease) 11/22/2015   Shoulder impingement, left 11/22/2015   Osteoarthritis of both feet 11/22/2015   Osteoarthritis of both knees 11/22/2015   Chondromalacia, 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/2015   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   Pain of left upper extremity 07/21/2013   Other complications due to renal dialysis device, implant, and graft 07/21/2013   Murmur 04/12/2013   Syncope 04/12/2013   Chronic kidney disease (CKD), stage IV (severe) (HCC) 03/02/2013   End stage renal disease (HCC) 02/25/2012   Hypothyroidism 01/14/2007   Gout 01/14/2007   Essential hypertension 01/14/2007   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   Nephritis and nephropathy, with pathological lesion in  kidney 01/14/2007   OTHER DYSPHAGIA 01/14/2007   DIARRHEA 01/14/2007   HELICOBACTER PYLORI INFECTION, HX OF 01/14/2007   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/2015   Chronic kidney disease    Depression    Eczema    hx: of   GERD (gastroesophageal reflux disease)    Gout    Hypothyroidism    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   Osteoarthritis of both feet 11/22/2015   Osteoarthritis of both knees 11/22/2015   Pneumonia    Pre-diabetes    Shoulder impingement, left 11/22/2015   Sleep apnea     Past Surgical History:  Procedure Laterality Date   AV FISTULA PLACEMENT Left 03/18/2013   Procedure: ARTERIOVENOUS (AV) FISTULA CREATION- BRACHIOCEPHALIC;  Surgeon: Lynwood JONETTA Collum, MD;  Location: [facility] OR;  Service: Vascular;  Laterality: Left;   CATARACT EXTRACTION     01/2018 left 07/2018 right    COLONOSCOPY  11/25/2014   Colon polyp, staus post polypectomy. Internal Hemorrhoids. Otherwise normal colonoscopy to terminal ileum   HEMORRHOID SURGERY     INGUINAL HERNIA REPAIR Right 02/12/2023   Procedure: OPEN RIGHT INGUINALHERNIA REPAIR WITH MESH;  Surgeon: Vernetta Berg, MD;  Location: [facility] ORS;  Service: General;  Laterality: Right;   KIDNEY TRANSPLANT  2020   RENAL BIOPSY     SHOULDER SURGERY     UPPER GI ENDOSCOPY      Current Outpatient Medications  Medication Sig Dispense Refill Last Dose/Taking   acetaminophen  (TYLENOL ) 500 MG tablet Take 500 mg by mouth 3 (three) times daily as needed for mild pain (pain score 1-3) or moderate pain (pain score 4-6) (Body  pain).      ALPRAZolam  (XANAX ) 0.25 MG tablet Take 0.25 mg by mouth once a week.      amoxicillin (AMOXIL) 500 MG capsule Take 500 mg by mouth as needed. With Dental procedures      aspirin  EC 81 MG tablet Take 81 mg by mouth daily.       benzonatate  (TESSALON ) 200 MG capsule Take 200 mg by mouth daily.      Blood Glucose Monitoring Suppl (ONETOUCH VERIO) w/Device  KIT       Cholecalciferol  (VITAMIN D ) 50 MCG (2000 UT) CAPS Take 1 capsule by mouth daily.      clobetasol ointment (TEMOVATE) 0.05 % Apply 1 Application topically daily as needed (Eczema). (Patient not taking: Reported on 12/16/2023)      insulin  glargine (LANTUS  SOLOSTAR) 100 UNIT/ML Solostar Pen Inject 5 Units into the skin daily. You can increase by 1 unit every 3-4 days to target a morning blood sugar 80-120      insulin  lispro (HUMALOG) 100 UNIT/ML KwikPen Inject 40 Units into the skin See admin instructions. INJECT UNDER THE SKIN ACCORDING TO SLIDING SCALE FOR HIGH SUGAR. MAX DAILY DOSE: 40 UNITS      Insulin  Syringe-Needle U-100 31G X 15/64 0.3 ML MISC Use 1 Syringe 3 (three) times daily before meals      Lancets (ONETOUCH DELICA PLUS LANCET33G) MISC       lansoprazole  (PREVACID ) 30 MG capsule Take 1 capsule (30 mg total) by mouth daily at 12 noon. Call [phone] for an office visit for more refills (Patient taking differently: Take 30 mg by mouth daily as needed (Gi issues). Call [phone] for an office visit for more refills) 90 capsule 0    levothyroxine (SYNTHROID) 137 MCG tablet Take 137 mcg by mouth daily.      lisinopril  (ZESTRIL ) 5 MG tablet Take 5 mg by mouth daily.      melatonin 5 MG TABS Take 1 mg by mouth at bedtime.      mycophenolate  (CELLCEPT ) 250 MG capsule Take 1,000 mg by mouth 2 (two) times daily.       ONETOUCH VERIO test strip       polyethylene glycol (MIRALAX  / GLYCOLAX ) 17 g packet Take 17 g by mouth daily.      predniSONE  (DELTASONE ) 5 MG tablet Take 4 tablets x2 days, 3 tablets x2 days, 2 tablets x2 days, 1 tablet x2 days. (Patient taking differently: Take 5 mg by mouth daily.) 20 tablet 0    rosuvastatin  (CRESTOR ) 5 MG tablet Take 5 mg by mouth daily.      sitaGLIPtin (JANUVIA) 100 MG tablet Take 100 mg by mouth daily.      tacrolimus  (PROGRAF ) 0.5 MG capsule Take 1 mg by mouth 2 (two) times daily.      tamsulosin  (FLOMAX ) 0.4 MG CAPS capsule TAKE 1 CAPSULE  BY MOUTH EVERY  NIGHT AT BEDTIME 90 capsule 3    traMADol  (ULTRAM ) 50 MG tablet Take 50 mg by mouth every 6 (six) hours as needed (Back pain).      traMADol  (ULTRAM ) 50 MG tablet Take 1 tablet (50 mg total) by mouth every 6 (six) hours as needed for moderate pain (pain score 4-6) or severe pain (pain score 7-10). 25 tablet 0    No current facility-administered medications for this visit.   Allergies  Allergen Reactions   Blue Dyes (Parenteral) Hives   Escitalopram     Other Reaction(s): GI side effects, Other (See Comments)  Constipation,  Hypertension   Jardiance [Empagliflozin]    Metformin Dermatitis   Nsaids Other (See Comments)   Olmesartan     Other Reaction(s): Musculoskeletal stiffness, Other (See Comments)  Join stiffness   Rosuvastatin      Other Reaction(s): Arthralgias, Other (See Comments)   Uloric [Febuxostat] Other (See Comments)    Joints stiffened   Alatrofloxacin Rash    Other Reaction(s): Unknown   Paricalcitol Rash    Other Reaction(s): Skin Rash, Unknown Zemphlar   Sevelamer Rash   Sulfa Antibiotics Rash    Social History   Tobacco Use   Smoking status: Never   Smokeless tobacco: Never  Substance Use Topics   Alcohol  use: No    Family History  Problem Relation Age of Onset   Diabetes Father    Hypertension Father    Cancer Mother    Diabetes Brother      Review of Systems  Musculoskeletal:  Positive for arthralgias and gait problem.  All other systems reviewed and are negative.   Objective:  Physical Exam Constitutional:      Appearance: Normal appearance.  HENT:     Head: Normocephalic and atraumatic.     Nose: Nose normal.     Mouth/Throat:     Mouth: Mucous membranes are moist.     Pharynx: Oropharynx is clear.  Eyes:     Conjunctiva/sclera: Conjunctivae normal.  Cardiovascular:     Rate and Rhythm: Normal rate and regular rhythm.     Pulses: Normal pulses.     Heart sounds: Normal heart sounds.  Pulmonary:     Effort:  Pulmonary effort is normal.     Breath sounds: Normal breath sounds.  Abdominal:     General: Abdomen is flat.     Palpations: Abdomen is soft.  Genitourinary:    Comments: Deferred.  Musculoskeletal:     Cervical back: Normal range of motion and neck supple.     Comments: Examination of the right hip reveals no skin wounds or lesions. He has limited range of motion. I can flex him up to 90 degrees. He has severe pain with terminal flexion and rotation. Mild trochanteric tenderness. Hip abductor strength 4/5.    Neurovascular intact distally.   Skin:    General: Skin is warm and dry.     Capillary Refill: Capillary refill takes less than 2 seconds.  Neurological:     General: No focal deficit present.     Mental Status: He is alert and oriented to person, place, and time.  Psychiatric:        Mood and Affect: Mood normal.        Behavior: Behavior normal.        Thought Content: Thought content normal.        Judgment: Judgment normal.     Vital signs in last 24 hours: @VSRANGES @  Labs:   Estimated body mass index is 24.87 kg/m as calculated from the following:   Height as of 07/22/23: 5' 7 (1.702 m).   Weight as of 07/22/23: 72 kg.   Imaging Review Plain radiographs demonstrate severe degenerative joint disease of the right hip(s). The bone quality appears to be adequate for age and reported activity level.      Assessment/Plan:  End stage arthritis, right hip(s)  The patient history, physical examination, clinical judgement of the provider and imaging studies are consistent with end stage degenerative joint disease of the right hip(s) and total hip arthroplasty is deemed medically necessary. The treatment options including  medical management, injection therapy, arthroscopy and arthroplasty were discussed at length. The risks and benefits of total hip arthroplasty were presented and reviewed. The risks due to aseptic loosening, infection, stiffness,  dislocation/subluxation,  thromboembolic complications and other imponderables were discussed.  The patient acknowledged the explanation, agreed to proceed with the plan and consent was signed. Patient is being admitted for inpatient treatment for surgery, pain control, PT, OT, prophylactic antibiotics, VTE prophylaxis, progressive ambulation and ADL's and discharge planning.The patient is planning to be discharged home with HEP after an overnight stay.   Therapy Plans: HEP. Discussed the importance of hip abductor strengthening exercises including 150-300 standing side leg raises per day. Also, non-pounding exercises with a stationary bike/elliptical for 30 minutes 3x/week. Disposition: Home with wife.  Planned DVT Prophylaxis: aspirin  81mg  BID DME needed: walker. Has cane.  PCP: Cleared.  Cardiology: Cleared okay to hold aspirin  5-7 days PRN.  Nephrology: Cleared.  TXA: IV Allergies:  - alatrofloxacin - rash - Blue dye - rash. - Dapagliflozin - rash - Escitalopram - constipation, hypertension. - febuxostat - stiff joints.  - metformin - rash.  GLENWOOD Brick - rash.  - Lexapro - itching.  - Olmesartan - stiffness.  - Paricalcitol - rash - Sevelamer - rash - Sulfa - hives.  - Uloric - stiff joints.  Anesthesia Concerns: none.  BMI: 24.8 Last HgbA1c: 6.2.  Other: - Will need stress dose steroids.  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  - T2DM, insulin  lantus .  - Aspirin  81mg  baseline.  - Hydrocodone , zofran .  - No NSAIDs.  - Labs pending.      Patient's anticipated LOS is less than 2 midnights, meeting these requirements: - Younger than 71 - Lives within 1 hour of care - Has a competent adult at home to recover with post-op recover - NO history of  - Chronic pain requiring opiods  - Diabetes  - Coronary Artery Disease  - Heart failure  - Heart attack  - Stroke  - DVT/VTE  - Cardiac  arrhythmia  - Respiratory Failure/COPD  - Renal failure  - Anemia  - Advanced Liver disease

## 2023-12-15 NOTE — H&P (View-Only) (Signed)
 TOTAL HIP ADMISSION H&P  Patient is admitted for right total hip arthroplasty.  Subjective:  Chief Complaint: right hip pain  HPI: Colin Mcfarland, 58 y.o. male, has a history of pain and functional disability in the right hip(s) due to arthritis and patient has failed non-surgical conservative treatments for greater than 12 weeks to include NSAID's and/or analgesics, corticosteriod injections, flexibility and strengthening excercises, use of assistive devices, and activity modification.  Onset of symptoms was gradual starting 10 years ago with rapidlly worsening course since that time.The patient noted no past surgery on the right hip(s).  Patient currently rates pain in the right hip at 10 out of 10 with activity. Patient has night pain, worsening of pain with activity and weight bearing, trendelenberg gait, pain that interfers with activities of daily living, and pain with passive range of motion. Patient has evidence of subchondral cysts, subchondral sclerosis, periarticular osteophytes, and joint space narrowing by imaging studies. This condition presents safety issues increasing the risk of falls.  There is no current active infection.  Patient Active Problem List   Diagnosis Date Noted   Pneumonia 04/28/2017   GERD (gastroesophageal reflux disease) 11/22/2015   Shoulder impingement, left 11/22/2015   Osteoarthritis of both feet 11/22/2015   Osteoarthritis of both knees 11/22/2015   Chondromalacia, 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/2015   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   Pain of left upper extremity 07/21/2013   Other complications due to renal dialysis device, implant, and graft 07/21/2013   Murmur 04/12/2013   Syncope 04/12/2013   Chronic kidney disease (CKD), stage IV (severe) (HCC) 03/02/2013   End stage renal disease (HCC) 02/25/2012   Hypothyroidism 01/14/2007   Gout 01/14/2007   Essential hypertension 01/14/2007   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   Nephritis and nephropathy, with pathological lesion in  kidney 01/14/2007   OTHER DYSPHAGIA 01/14/2007   DIARRHEA 01/14/2007   HELICOBACTER PYLORI INFECTION, HX OF 01/14/2007   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/2015   Chronic kidney disease    Depression    Eczema    hx: of   GERD (gastroesophageal reflux disease)    Gout    Heart murmur    ECHO 07-2023   Hypertension    Hypothyroidism    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   Osteoarthritis of both feet 11/22/2015   Osteoarthritis of both knees 11/22/2015   Pneumonia    Pre-diabetes    Shoulder impingement, left 11/22/2015   Sleep apnea     Past Surgical History:  Procedure Laterality Date   AV FISTULA PLACEMENT Left 03/18/2013   Procedure: ARTERIOVENOUS (AV) FISTULA CREATION- BRACHIOCEPHALIC;  Surgeon: Lynwood JONETTA Collum, MD;  Location: [facility] OR;  Service: Vascular;  Laterality: Left;   CATARACT EXTRACTION     01/2018 left 07/2018 right    COLONOSCOPY  11/25/2014   Colon polyp, staus post polypectomy. Internal Hemorrhoids. Otherwise normal colonoscopy to terminal ileum   HEMORRHOID SURGERY     INGUINAL HERNIA REPAIR Right 02/12/2023   Procedure: OPEN RIGHT INGUINALHERNIA REPAIR WITH MESH;  Surgeon: Vernetta Berg, MD;  Location: [facility] ORS;  Service: General;  Laterality: Right;   KIDNEY TRANSPLANT  2020   RENAL BIOPSY     SHOULDER SURGERY Right 2015   UPPER GI ENDOSCOPY      Current Outpatient Medications  Medication Sig Dispense Refill Last Dose/Taking   acetaminophen  (TYLENOL ) 500 MG tablet Take 500 mg by mouth 3 (three) times daily as needed  for mild pain (pain score 1-3) or moderate pain (pain score 4-6) (Body pain).      ALPRAZolam (XANAX) 0.25 MG tablet Take 0.25 mg by mouth once a week.      amoxicillin (AMOXIL) 500 MG capsule Take 500 mg by mouth as needed. With Dental procedures      aspirin EC 81 MG tablet Take 81 mg by mouth daily.       benzonatate (TESSALON) 200 MG capsule Take 200 mg by mouth daily.       Blood Glucose Monitoring Suppl (ONETOUCH VERIO) w/Device KIT       Cholecalciferol (VITAMIN D) 50 MCG (2000 UT) CAPS Take 1 capsule by mouth daily.      clobetasol ointment (TEMOVATE) 0.05 % Apply 1 Application topically daily as needed (Eczema). (Patient not taking: Reported on 12/16/2023)      insulin  glargine (LANTUS SOLOSTAR) 100 UNIT/ML Solostar Pen Inject 5 Units into the skin daily. You can increase by 1 unit every 3-4 days to target a morning blood sugar 80-120      insulin  lispro (HUMALOG) 100 UNIT/ML KwikPen Inject 40 Units into the skin See admin instructions. INJECT UNDER THE SKIN ACCORDING TO SLIDING SCALE FOR HIGH SUGAR. MAX DAILY DOSE: 40 UNITS      Insulin  Syringe-Needle U-100 31G X 15/64 0.3 ML MISC Use 1 Syringe 3 (three) times daily before meals      Lancets (ONETOUCH DELICA PLUS LANCET33G) MISC       lansoprazole  (PREVACID ) 30 MG capsule Take 1 capsule (30 mg total) by mouth daily at 12 noon. Call [phone] for an office visit for more refills (Patient taking differently: Take 30 mg by mouth daily as needed (Gi issues). Call [phone] for an office visit for more refills) 90 capsule 0    levothyroxine (SYNTHROID) 137 MCG tablet Take 137 mcg by mouth daily.      lisinopril (ZESTRIL) 5 MG tablet Take 5 mg by mouth daily.      melatonin 5 MG TABS Take 1 mg by mouth at bedtime.      mycophenolate  (CELLCEPT ) 250 MG capsule Take 1,000 mg by mouth 2 (two) times daily.       ONETOUCH VERIO test strip       polyethylene glycol (MIRALAX / GLYCOLAX) 17 g packet Take 17 g by mouth daily.      predniSONE  (DELTASONE ) 5 MG tablet Take 4 tablets x2 days, 3 tablets x2 days, 2 tablets x2 days, 1 tablet x2 days. (Patient taking differently: Take 5 mg by mouth daily.) 20 tablet 0    rosuvastatin (CRESTOR) 5 MG tablet Take 5 mg by mouth daily.      sitaGLIPtin (JANUVIA) 100 MG tablet Take 100 mg by mouth daily.      tacrolimus  (PROGRAF ) 0.5 MG capsule Take 1 mg by mouth 2 (two) times daily.       tamsulosin (FLOMAX) 0.4 MG CAPS capsule TAKE 1 CAPSULE BY MOUTH EVERY  NIGHT AT BEDTIME 90 capsule 3    traMADol  (ULTRAM ) 50 MG tablet Take 50 mg by mouth every 6 (six) hours as needed (Back pain).      traMADol  (ULTRAM ) 50 MG tablet Take 1 tablet (50 mg total) by mouth every 6 (six) hours as needed for moderate pain (pain score 4-6) or severe pain (pain score 7-10). 25 tablet 0    No current facility-administered medications for this visit.   Allergies  Allergen Reactions   Sulfa Antibiotics Anaphylaxis and Rash  As a child and was never to take   Blue Dyes (Parenteral) Hives   Escitalopram     Other Reaction(s): GI side effects, Other (See Comments)  Constipation, Hypertension   Jardiance [Empagliflozin]    Metformin Dermatitis   Nsaids Other (See Comments)   Olmesartan     Other Reaction(s): Musculoskeletal stiffness, Other (See Comments)  Join stiffness   Rosuvastatin     Other Reaction(s): Arthralgias, Other (See Comments)   Uloric [Febuxostat] Other (See Comments)    Joints stiffened   Alatrofloxacin Rash    Other Reaction(s): Unknown   Paricalcitol Rash    Other Reaction(s): Skin Rash, Unknown Zemphlar   Sevelamer Rash    Social History   Tobacco Use   Smoking status: Never   Smokeless tobacco: Never  Substance Use Topics   Alcohol use: No    Family History  Problem Relation Age of Onset   Diabetes Father    Hypertension Father    Cancer Mother    Diabetes Brother      Review of Systems  Musculoskeletal:  Positive for arthralgias and gait problem.  All other systems reviewed and are negative.   Objective:  Physical Exam Constitutional:      Appearance: Normal appearance.  HENT:     Head: Normocephalic and atraumatic.     Nose: Nose normal.     Mouth/Throat:     Mouth: Mucous membranes are moist.     Pharynx: Oropharynx is clear.  Eyes:     Conjunctiva/sclera: Conjunctivae normal.  Cardiovascular:     Rate and Rhythm: Normal rate and  regular rhythm.     Pulses: Normal pulses.     Heart sounds: Normal heart sounds.  Pulmonary:     Effort: Pulmonary effort is normal.     Breath sounds: Normal breath sounds.  Abdominal:     General: Abdomen is flat.     Palpations: Abdomen is soft.  Genitourinary:    Comments: Deferred.  Musculoskeletal:     Cervical back: Normal range of motion and neck supple.     Comments: Examination of the right hip reveals no skin wounds or lesions. He has limited range of motion. I can flex him up to 90 degrees. He has severe pain with terminal flexion and rotation. Mild trochanteric tenderness. Hip abductor strength 4/5.    Neurovascular intact distally.   Skin:    General: Skin is warm and dry.     Capillary Refill: Capillary refill takes less than 2 seconds.  Neurological:     General: No focal deficit present.     Mental Status: He is alert and oriented to person, place, and time.  Psychiatric:        Mood and Affect: Mood normal.        Behavior: Behavior normal.        Thought Content: Thought content normal.        Judgment: Judgment normal.     Vital signs in last 24 hours: @VSRANGES @  Labs:   Estimated body mass index is 24.52 kg/m as calculated from the following:   Height as of 12/22/23: 5' 7 (1.702 m).   Weight as of 12/22/23: 71 kg.   Imaging Review Plain radiographs demonstrate severe degenerative joint disease of the right hip(s). The bone quality appears to be adequate for age and reported activity level.      Assessment/Plan:  End stage arthritis, right hip(s)  The patient history, physical examination, clinical judgement of the provider and  imaging studies are consistent with end stage degenerative joint disease of the right hip(s) and total hip arthroplasty is deemed medically necessary. The treatment options including medical management, injection therapy, arthroscopy and arthroplasty were discussed at length. The risks and benefits of total hip  arthroplasty were presented and reviewed. The risks due to aseptic loosening, infection, stiffness, dislocation/subluxation,  thromboembolic complications and other imponderables were discussed.  The patient acknowledged the explanation, agreed to proceed with the plan and consent was signed. Patient is being admitted for inpatient treatment for surgery, pain control, PT, OT, prophylactic antibiotics, VTE prophylaxis, progressive ambulation and ADL's and discharge planning.The patient is planning to be discharged home with HEP after an overnight stay.   Therapy Plans: HEP. Discussed the importance of hip abductor strengthening exercises including 150-300 standing side leg raises per day. Also, non-pounding exercises with a stationary bike/elliptical for 30 minutes 3x/week. Disposition: Home with wife.  Planned DVT Prophylaxis: aspirin 81mg  BID DME needed: walker. Has cane.  PCP: Cleared.  Cardiology: Cleared okay to hold aspirin 5-7 days PRN.  Nephrology: Cleared.  TXA: IV Allergies:  - alatrofloxacin - rash - Blue dye - rash. - Dapagliflozin - rash - Escitalopram - constipation, hypertension. - febuxostat - stiff joints.  - metformin - rash.  GLENWOOD Brick - rash.  - Lexapro - itching.  - Olmesartan - stiffness.  - Paricalcitol - rash - Sevelamer - rash - Sulfa - hives.  - Uloric - stiff joints.  Anesthesia Concerns: none.  BMI: 24.8 Last HgbA1c: 6.2.  Other: - Will need stress dose steroids.  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  - T2DM, insulin  lantus.  - Aspirin 81mg  baseline.  - Hydrocodone , zofran .  - No NSAIDs.  - 12/22/23: Hgb 13.7, K+ 4.3, Cr. 0.69.       Patient's anticipated LOS is less than 2 midnights, meeting these requirements: - Younger than 44 - Lives within 1 hour of care - Has a competent adult at home to recover with post-op recover - NO history of  - Chronic pain requiring  opiods  - Diabetes  - Coronary Artery Disease  - Heart failure  - Heart attack  - Stroke  - DVT/VTE  - Cardiac arrhythmia  - Respiratory Failure/COPD  - Renal failure  - Anemia  - Advanced Liver disease

## 2023-12-15 NOTE — Progress Notes (Signed)
 Surgery orders requested via Epic inbox.

## 2023-12-17 ENCOUNTER — Ambulatory Visit: Payer: Self-pay | Admitting: Student

## 2023-12-20 NOTE — Progress Notes (Incomplete)
 COVID Vaccine received:  []  No [x]  Yes Date of any COVID positive Test in last 90 days:  PCP -  Searcy Overcast, MD clearance scanned in Media  Cardiologist - Maude Emmer, MD, Rosaline Bane, NP-C  Cardiac clearance scanned to Media Transplant- Mikel Seen, MD ,Selinda Poster, PA-C  Clearance scanned to Media  Nephrology- Fairy Sellar, MD    Chest x-ray - 07-22-2023  2v  Epic EKG -  07-22-2023  Epic Stress Test -  ECHO -07-16-2023 Cardiac Cath -  CT Coronary Calcium score: 35.9 on 08-05-2023 Epic  Pacemaker / ICD device [x]  No []  Yes   Spinal Cord Stimulator:[x]  No []  Yes       History of Sleep Apnea? []  No [x]  Yes   CPAP used?- []  No []  Yes    Medication on DOS: Levothyroxine, Alprazolam, Mycophenolate  (Cellcept ), tacrolimus  (Prograf ), Prednisone , Tylenol  / Tramadol    Hold DOS: Lisinopril  Patient has: []  NO Hx DM   []  Pre-DM   []  DM1  [x]   DM2 Does the patient monitor blood sugar?   []  N/A   []  No [x]  Yes  Last A1c was: 6.2  on  04-18-2023    Does patient have a [employer] or Dexcom? []  No []  Yes   Fasting Blood Sugar Ranges-  Checks Blood Sugar _____ times a day  Sitagliptin (JANUVIA)  Insulin  glargine (LANTUS) 5 units daily.  Insulin  Lispro (HUMALOG)  40 units  SS  Blood Thinner / Instructions: Aspirin Instructions:  ASA 81 mg, cleared to Hold 5-7 days   Activity level: Able to walk up 2 flights of stairs without becoming significantly short of breath or having chest pain?  []  No   []    Yes  Patient can perform ADLs without assistance. []  No   []   Yes  Comments: LEFT ARM RESTRICTION- has AVF for Dialysis use 4 years prior to Renal transplant (d/t IgA nephropathy) in 2020 at [facility]  Anesthesia review: HTN, CAD, cardiac murmur (ECHO- 07-2023), Syncope, GERD, Gout, OSA- ?? CPAP  Patient denies any S&S of respiratory illness or Covid - no shortness of breath, fever, cough or chest pain at PAT appointment.  Patient verbalized understanding and agreement to the  Pre-Surgical Instructions that were given to them at this PAT appointment. Patient was also educated of the need to review these PAT instructions again prior to his surgery.I reviewed the appropriate phone numbers to call if they have any and questions or concerns.

## 2023-12-20 NOTE — Patient Instructions (Addendum)
 SURGICAL WAITING ROOM VISITATION Patients having surgery or a procedure may have no more than 2 support people in the waiting area - these visitors may rotate in the visitor waiting room.   If the patient needs to stay at the hospital during part of their recovery, the visitor guidelines for inpatient rooms apply.  PRE-OP  VISITATION  Pre-op  nurse will coordinate an appropriate time for 1 support person to accompany the patient in pre-op .  This support person may not rotate.  This visitor will be contacted when the time is appropriate for the visitor to come back in the pre-op  area.  Please refer to the [facility] website for the visitor guidelines for Inpatients (after your surgery is over and you are in a regular room).  You are not required to quarantine at this time prior to your surgery. However, you must do this: Hand Hygiene often Do NOT share personal items Notify your provider if you are in close contact with someone who has COVID or you develop fever 100.4 or greater, new onset of sneezing, cough, sore throat, shortness of breath or body aches.  If you test positive for Covid or have been in contact with anyone that has tested positive in the last 10 days please notify you surgeon.    Your procedure is scheduled on:  [facility]  12-31-23  Report to [facility] Main Entrance: [address] entrance where the [employer] is available.   Report to admitting at: 09:00    AM  Call this number if you have any questions or problems the morning of surgery  [phone]  How to Manage Your Diabetes Before and After Surgery  Why is it important to control my blood sugar before and after surgery? Improving blood sugar levels before and after surgery helps healing and can limit problems. A way of improving blood sugar control is eating a healthy diet by:  Eating less sugar and carbohydrates  Increasing activity/exercise  Talking with your doctor about reaching your blood sugar  goals High blood sugars (greater than 180 mg/dL) can raise your risk of infections and slow your recovery, so you will need to focus on controlling your diabetes during the weeks before surgery. Make sure that the doctor who takes care of your diabetes knows about your planned surgery including the date and location.  How do I manage my blood sugar before surgery? Check your blood sugar at least 4 times a day, starting 2 days before surgery, to make sure that the level is not too high or low. Check your blood sugar the morning of your surgery when you wake up and every 2 hours until you get to the Short Stay unit. If your blood sugar is less than 70 mg/dL, you will need to treat for low blood sugar: Do not take insulin . Treat a low blood sugar (less than 70 mg/dL) with  cup of clear juice (cranberry or apple), 4 glucose tablets, OR glucose gel. Recheck blood sugar in 15 minutes after treatment (to make sure it is greater than 70 mg/dL). If your blood sugar is not greater than 70 mg/dL on recheck, call [phone] for further instructions. Report your blood sugar to the short stay nurse when you get to Short Stay.  If you are admitted to the hospital after surgery: Your blood sugar will be checked by the staff and you will probably be given insulin  after surgery (instead of oral diabetes medicines) to make sure you have good blood sugar levels. The goal  for blood sugar control after surgery is 80-180 mg/dL.   WHAT DO I DO ABOUT MY DIABETES MEDICATION?  Sitagliptin (JANUVIA) - Day BEFORE surgery take as usual.  DAY OF SURGERY: DO NOT TAKE.   Insulin  glargine (LANTUS) 5 units daily. Day BEFORE surgery, take as usual in the morning. DAY OF SURGERY: take 50%   Insulin  Lispro (HUMALOG)  40 units  SS--  Day BEFORE Surgery: take as usual but no bedtime dose.  DAY OF SURGERY: If your CBG is greater than 220 mg/dL, you may take  of your sliding scale (correction) dose of insulin .    IF you have  any questions, call the nurse at [phone]  Do not eat food after Midnight the night prior to your surgery/procedure.  After Midnight you may have the following liquids until  08:30 AM  DAY OF SURGERY  Clear Liquid Diet Water  Black Coffee (sugar ok, NO MILK/CREAM OR CREAMERS)  Tea (sugar ok, NO MILK/CREAM OR CREAMERS) regular and decaf                             Plain Jell-O  with no fruit (NO RED)                                           Fruit ices (not with fruit pulp, NO RED)                                     Popsicles (NO RED)                                                                  Juice: NO CITRUS JUICES: only apple, WHITE grape, WHITE cranberry Sports drinks like Gatorade or Powerade (NO RED)                   The day of surgery:  Drink ONE (1) Pre-Surgery G2 at    08:30 AM the morning of surgery. Drink in one sitting. Do not sip.  This drink was given to you during your hospital pre-op  appointment visit. Nothing else to drink after completing the Pre-Surgery G2 : No candy, chewing gum or throat lozenges.    FOLLOW ANY ADDITIONAL PRE OP INSTRUCTIONS YOU RECEIVED FROM YOUR SURGEON'S OFFICE!!!   Oral Hygiene is also important to reduce your risk of infection.        Remember - BRUSH YOUR TEETH THE MORNING OF SURGERY WITH YOUR REGULAR TOOTHPASTE  Do NOT smoke after Midnight the night before surgery.  STOP TAKING all Vitamins, Herbs and supplements 1 week before your surgery.   STOP TAKING ASPIRIN 1 week before your surgery, Last dose 12-24-2023  Take ONLY these medicines the morning of surgery with A SIP OF WATER :  Levothyroxine, Alprazolam, Mycophenolate  (Cellcept ), tacrolimus  (Prograf ), Prednisone , and EITHER Tylenol  OR Tramadol  if needed for pail.   DO NOT TAKE  Lisinopril  the morning of your surgery.  If You have been diagnosed with Sleep Apnea - Bring CPAP mask and tubing day of surgery.  We will provide you with a CPAP machine on the day of your  surgery.                   You may not have any metal on your body including  jewelry, and body piercing  Do not wear  lotions, powders, cologne, or deodorant  Men may shave face and neck.  Contacts, Hearing Aids, dentures or bridgework may not be worn into surgery. DENTURES WILL BE REMOVED PRIOR TO SURGERY PLEASE DO NOT APPLY Poly grip OR ADHESIVES!!!  You may bring a small overnight bag with you on the day of surgery, only pack items that are not valuable. [facility] IS NOT RESPONSIBLE   FOR VALUABLES THAT ARE LOST OR STOLEN.    Do not bring your home medications to the hospital. The Pharmacy will dispense medications listed on your medication list to you during your admission in the Hospital.  Special Instructions: Bring a copy of your healthcare power of attorney and living will documents the day of surgery, if you wish to have them scanned into your  Medical Records- EPIC  Please read over the following fact sheets you were given: IF YOU HAVE QUESTIONS ABOUT YOUR PRE-OP  INSTRUCTIONS, PLEASE CALL [phone].      Pre-operative 4 CHG Bath Instructions   You can play a key role in reducing the risk of infection after surgery. Your skin needs to be as free of germs as possible. You can reduce the number of germs on your skin by washing with CHG (chlorhexidine  gluconate) soap before surgery. CHG is an antiseptic soap that kills germs and continues to kill germs even after washing.   DO NOT use if you have an allergy to chlorhexidine /CHG or antibacterial soaps. If your skin becomes reddened or irritated, stop using the CHG and notify one of our RNs at [phone]  Please shower with the CHG soap starting 4 days before surgery using the following schedule:   SATURDAY  12-27-2023     Do NOT use CHG soap                                                                                                                      the morning of your  surgery.         Please keep in mind the following:  DO NOT shave, including legs and underarms, starting the day of your first shower.   You may shave your face at any point before/day of surgery.  Place clean sheets on your bed the day you start using CHG soap. Use a clean washcloth (not used since being washed) for each shower. DO NOT sleep with pets once you start using the CHG.  CHG Shower Instructions:  If you choose to wash your hair and private area, wash first with your normal shampoo/soap.  After you use shampoo/soap, rinse your hair and body thoroughly to remove shampoo/soap residue.  Turn the water  OFF and apply about 3 tablespoons (45 ml) of CHG soap to a CLEAN washcloth.  Apply CHG soap ONLY FROM YOUR NECK DOWN TO YOUR TOES (washing for 3-5 minutes)  DO NOT use CHG soap on face, private areas, open wounds, or sores.  Pay special attention to the area where your surgery is being performed.  If you are having back surgery, having someone wash your back for you may be helpful. Wait 2 minutes after CHG soap is applied, then you may rinse off the CHG soap.  Pat dry with a clean towel  Put on clean clothes/pajamas   If you choose to wear lotion, please use ONLY the CHG-compatible lotions on the back of this paper.     Additional instructions for the day of surgery: DO NOT APPLY any CHG Soap,  lotions, deodorants, cologne, or perfumes on the day of surgery  Put on clean/comfortable clothes.  Brush your teeth.  Ask your nurse before applying any prescription medications to the skin.   CHG Compatible Lotions   Aveeno Moisturizing lotion  Cetaphil Moisturizing Cream  Cetaphil Moisturizing Lotion  Clairol Herbal Essence Moisturizing Lotion, Dry Skin  Clairol Herbal Essence Moisturizing Lotion, Extra Dry Skin  Clairol Herbal Essence Moisturizing Lotion, Normal Skin  Curel Age Defying  Therapeutic Moisturizing Lotion with Alpha Hydroxy  Curel Extreme Care Body Lotion  Curel Soothing Hands Moisturizing Hand Lotion  Curel Therapeutic Moisturizing Cream, Fragrance-Free  Curel Therapeutic Moisturizing Lotion, Fragrance-Free  Curel Therapeutic Moisturizing Lotion, Original Formula  Eucerin Daily Replenishing Lotion  Eucerin Dry Skin Therapy Plus Alpha Hydroxy Crme  Eucerin Dry Skin Therapy Plus Alpha Hydroxy Lotion  Eucerin Original Crme  Eucerin Original Lotion  Eucerin Plus Crme Eucerin Plus Lotion  Eucerin TriLipid Replenishing Lotion  Keri Anti-Bacterial Hand Lotion  Keri Deep Conditioning Original Lotion Dry Skin Formula Softly Scented  Keri Deep Conditioning Original Lotion, Fragrance Free Sensitive Skin Formula  Keri Lotion Fast Absorbing Fragrance Free Sensitive Skin Formula  Keri Lotion Fast Absorbing Softly Scented Dry Skin Formula  Keri Original Lotion  Keri Skin Renewal Lotion Keri Silky Smooth Lotion  Keri Silky Smooth Sensitive Skin Lotion  Nivea Body Creamy Conditioning Oil  Nivea Body Extra Enriched Lotion  Nivea Body Original Lotion  Nivea Body Sheer Moisturizing Lotion Nivea Crme  Nivea Skin Firming Lotion  NutraDerm 30 Skin Lotion  NutraDerm Skin Lotion  NutraDerm Therapeutic Skin Cream  NutraDerm Therapeutic Skin Lotion  ProShield Protective Hand Cream  Provon moisturizing lotion   FAILURE TO FOLLOW THESE INSTRUCTIONS MAY RESULT IN THE CANCELLATION OF YOUR SURGERY  PATIENT SIGNATURE_________________________________  NURSE SIGNATURE__________________________________  ________________________________________________________________________       Colin Mcfarland    An incentive spirometer is a tool that can help keep your lungs clear and active. This tool measures how well  you are filling your lungs with each breath. Taking long deep breaths may help reverse or decrease the chance of developing breathing (pulmonary) problems  (especially infection) following: A long period of time when you are unable to move or be active. BEFORE THE PROCEDURE  If the spirometer includes an indicator to show your best effort, your nurse or respiratory therapist will set it to a desired goal. If possible, sit up straight or lean slightly forward. Try not to slouch. Hold the incentive spirometer in an upright position. INSTRUCTIONS FOR USE  Sit on the edge of your bed if possible, or sit up as far as you can in bed or on a chair. Hold the incentive spirometer in an upright position. Breathe out normally. Place the mouthpiece in your mouth and seal your lips tightly around it. Breathe in slowly and as deeply as possible, raising the piston or the ball toward the top of the column. Hold your breath for 3-5 seconds or for as long as possible. Allow the piston or ball to fall to the bottom of the column. Remove the mouthpiece from your mouth and breathe out normally. Rest for a few seconds and repeat Steps 1 through 7 at least 10 times every 1-2 hours when you are awake. Take your time and take a few normal breaths between deep breaths. The spirometer may include an indicator to show your best effort. Use the indicator as a goal to work toward during each repetition. After each set of 10 deep breaths, practice coughing to be sure your lungs are clear. If you have an incision (the cut made at the time of surgery), support your incision when coughing by placing a pillow or rolled up towels firmly against it. Once you are able to get out of bed, walk around indoors and cough well. You may stop using the incentive spirometer when instructed by your caregiver.  RISKS AND COMPLICATIONS Take your time so you do not get dizzy or light-headed. If you are in pain, you may need to take or ask for pain medication before doing incentive spirometry. It is harder to take a deep breath if you are having pain. AFTER USE Rest and breathe slowly and  easily. It can be helpful to keep track of a log of your progress. Your caregiver can provide you with a simple table to help with this. If you are using the spirometer at home, follow these instructions: SEEK MEDICAL CARE IF:  You are having difficultly using the spirometer. You have trouble using the spirometer as often as instructed. Your pain medication is not giving enough relief while using the spirometer. You develop fever of 100.5 F (38.1 C) or higher.                                                                                                    SEEK IMMEDIATE MEDICAL CARE IF:  You cough up bloody sputum that had not been present before. You develop fever of 102 F (38.9 C) or greater. You develop worsening pain at or near the incision site. MAKE SURE  YOU:  Understand these instructions. Will watch your condition. Will get help right away if you are not doing well or get worse. Document Released: 05/13/2006 Document Revised: 03/25/2011 Document Reviewed: 07/14/2006 [facility] Patient Information 2014 [place], [place].         WHAT IS A BLOOD TRANSFUSION? Blood Transfusion Information  A transfusion is the replacement of blood or some of its parts. Blood is made up of multiple cells which provide different functions. Red blood cells carry oxygen and are used for blood loss replacement. White blood cells fight against infection. Platelets control bleeding. Plasma helps clot blood. Other blood products are available for specialized needs, such as hemophilia or other clotting disorders. BEFORE THE TRANSFUSION  Who gives blood for transfusions?  Healthy volunteers who are fully evaluated to make sure their blood is safe. This is blood bank blood. Transfusion therapy is the safest it has ever been in the practice of medicine. Before blood is taken from a donor, a complete history is taken to make sure that person has no history of diseases nor engages in risky social  behavior (examples are intravenous drug use or sexual activity with multiple partners). The donor's travel history is screened to minimize risk of transmitting infections, such as malaria. The donated blood is tested for signs of infectious diseases, such as HIV and hepatitis. The blood is then tested to be sure it is compatible with you in order to minimize the chance of a transfusion reaction. If you or a relative donates blood, this is often done in anticipation of surgery and is not appropriate for emergency situations. It takes many days to process the donated blood. RISKS AND COMPLICATIONS Although transfusion therapy is very safe and saves many lives, the main dangers of transfusion include:  Getting an infectious disease. Developing a transfusion reaction. This is an allergic reaction to something in the blood you were given. Every precaution is taken to prevent this. The decision to have a blood transfusion has been considered carefully by your caregiver before blood is given. Blood is not given unless the benefits outweigh the risks. AFTER THE TRANSFUSION Right after receiving a blood transfusion, you will usually feel much better and more energetic. This is especially true if your red blood cells have gotten low (anemic). The transfusion raises the level of the red blood cells which carry oxygen, and this usually causes an energy increase. The nurse administering the transfusion will monitor you carefully for complications. HOME CARE INSTRUCTIONS  No special instructions are needed after a transfusion. You may find your energy is better. Speak with your caregiver about any limitations on activity for underlying diseases you may have. SEEK MEDICAL CARE IF:  Your condition is not improving after your transfusion. You develop redness or irritation at the intravenous (IV) site. SEEK IMMEDIATE MEDICAL CARE IF:  Any of the following symptoms occur over the next 12 hours: Shaking chills. You  have a temperature by mouth above 102 F (38.9 C), not controlled by medicine. Chest, back, or muscle pain. People around you feel you are not acting correctly or are confused. Shortness of breath or difficulty breathing. Dizziness and fainting. You get a rash or develop hives. You have a decrease in urine output. Your urine turns a dark color or changes to pink, red, or brown. Any of the following symptoms occur over the next 10 days: You have a temperature by mouth above 102 F (38.9 C), not controlled by medicine. Shortness of breath. Weakness after normal activity.  The white part of the eye turns yellow (jaundice). You have a decrease in the amount of urine or are urinating less often. Your urine turns a dark color or changes to pink, red, or brown. Document Released: 12/29/1999 Document Revised: 03/25/2011 Document Reviewed: 08/17/2007 [facility] Patient Information 2014 [place], [place].  _______________________________________________________________________           If you would like to see a video about joint replacement:   [email or web address]

## 2023-12-22 ENCOUNTER — Encounter (HOSPITAL_COMMUNITY): Payer: Self-pay

## 2023-12-22 ENCOUNTER — Other Ambulatory Visit: Payer: Self-pay

## 2023-12-22 ENCOUNTER — Ambulatory Visit: Payer: Self-pay | Admitting: Student

## 2023-12-22 ENCOUNTER — Encounter (HOSPITAL_COMMUNITY)
Admission: RE | Admit: 2023-12-22 | Discharge: 2023-12-22 | Disposition: A | Source: Ambulatory Visit | Attending: Orthopedic Surgery

## 2023-12-22 VITALS — BP 150/72 | HR 76 | Temp 97.9°F | Resp 16 | Ht 67.0 in | Wt 156.5 lb

## 2023-12-22 DIAGNOSIS — M1611 Unilateral primary osteoarthritis, right hip: Secondary | ICD-10-CM

## 2023-12-22 DIAGNOSIS — I2510 Atherosclerotic heart disease of native coronary artery without angina pectoris: Secondary | ICD-10-CM

## 2023-12-22 DIAGNOSIS — Z79899 Other long term (current) drug therapy: Secondary | ICD-10-CM

## 2023-12-22 DIAGNOSIS — Z794 Long term (current) use of insulin: Secondary | ICD-10-CM

## 2023-12-22 DIAGNOSIS — Z01818 Encounter for other preprocedural examination: Secondary | ICD-10-CM

## 2023-12-22 HISTORY — DX: Cardiac murmur, unspecified: R01.1

## 2023-12-22 LAB — CBC
HCT: 41.1 % (ref 39.0–52.0)
Hemoglobin: 13.8 g/dL (ref 13.0–17.0)
MCH: 28.7 pg (ref 26.0–34.0)
MCHC: 33.6 g/dL (ref 30.0–36.0)
MCV: 85.4 fL (ref 80.0–100.0)
Platelets: 207 K/uL (ref 150–400)
RBC: 4.81 MIL/uL (ref 4.22–5.81)
RDW: 12.9 % (ref 11.5–15.5)
WBC: 8.9 K/uL (ref 4.0–10.5)
nRBC: 0 % (ref 0.0–0.2)

## 2023-12-22 LAB — SURGICAL PCR SCREEN
MRSA, PCR: NEGATIVE
Staphylococcus aureus: POSITIVE — AB

## 2023-12-22 LAB — COMPREHENSIVE METABOLIC PANEL WITH GFR
ALT: 17 U/L (ref 0–44)
AST: 20 U/L (ref 15–41)
Albumin: 4.5 g/dL (ref 3.5–5.0)
Alkaline Phosphatase: 68 U/L (ref 38–126)
Anion gap: 11 (ref 5–15)
BUN: 13 mg/dL (ref 6–20)
CO2: 21 mmol/L — ABNORMAL LOW (ref 22–32)
Calcium: 9.8 mg/dL (ref 8.9–10.3)
Chloride: 102 mmol/L (ref 98–111)
Creatinine, Ser: 0.69 mg/dL (ref 0.61–1.24)
GFR, Estimated: >60 mL/min (ref >60)
Glucose, Bld: 139 mg/dL — ABNORMAL HIGH (ref 70–99)
Potassium: 4.3 mmol/L (ref 3.5–5.1)
Sodium: 133 mmol/L — ABNORMAL LOW (ref 135–145)
Total Bilirubin: 0.8 mg/dL (ref 0.0–1.2)
Total Protein: 7.1 g/dL (ref 6.5–8.1)

## 2023-12-22 LAB — GLUCOSE, CAPILLARY: Glucose-Capillary: 140 mg/dL — ABNORMAL HIGH (ref 70–99)

## 2023-12-22 LAB — HEMOGLOBIN A1C
Hgb A1c MFr Bld: 6.1 % — ABNORMAL HIGH (ref 4.8–5.6)
Mean Plasma Glucose: 128.37 mg/dL

## 2023-12-22 NOTE — Progress Notes (Signed)
 Patient's PCR screen is positive for  [facility]. Appropriate notes have been placed on the patient's chart. This note has been routed to Dr. Leighton  for review. The Patient's surgery is currently scheduled for: 12-31-23   at [facility].  Shawnee Aloe, BSN, CVRN-BC   Pre-Surgical Testing Nurse [facility]  [phone]

## 2023-12-23 NOTE — Progress Notes (Signed)
 Case: [id number] Date/Time: 12/31/23 1111   Procedure: ARTHROPLASTY, HIP, TOTAL, ANTERIOR APPROACH (Right: Hip)   Anesthesia type: Spinal   Diagnosis: Primary osteoarthritis of right hip [M16.11]   Pre-op  diagnosis: Right hip osteoarthritis   Location: [facility] ROOM 07 / [facility]   Surgeons: Fidel Rogue, MD       DISCUSSION: Colin Mcfarland is a 58 yo male with PMH of heart murmur, CAD, HTN, OSA, GERD, hx of neuroendocrine tumor s/p resection (2019), ESRD s/p kidney transplant (2020), hypothyroid, arthritis, anxiety, depression.  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 Seen by Nephrology on 07/28/23. Kidney function normal on pre op labs. He is on Tacrolimus , CellCept , and Prednisone . Medical clearance signed patient is cleared (scanned in media on 10/13/23).  Patient seen by Cardiology on 07/22/23 for cardiac clearance prior to hip surgery. Echo ordered for heart murmur which showed normal LVEF 55-60%, grade I DD, and moderate to severe AI. He was cleared for surgery:  Preoperative:  ok to proceed with right THR with DR Swinteck. EF has been normal no documented CAD and good functional status No issues with surgery / general anesthesia January this year with hernia repair   VS: BP (!) 150/72   Pulse 76   Temp 36.6 C (Oral)   Resp 16   Ht 5' 7 (1.702 m)   Wt 71 kg   SpO2 97%   BMI 24.52 kg/m   PROVIDERS: Avva, Ravisankar, MD   LABS: Labs reviewed: Acceptable for surgery. (all labs ordered are listed, but only abnormal results are displayed)  Labs Reviewed  SURGICAL PCR SCREEN - Abnormal; Notable for the following components:      Result Value   Staphylococcus aureus POSITIVE (*)    All other components within normal limits  HEMOGLOBIN A1C - Abnormal; Notable for the following components:   Hgb A1c MFr Bld 6.1 (*)    All other components within normal limits  COMPREHENSIVE METABOLIC PANEL WITH GFR - Abnormal; Notable for the  following components:   Sodium 133 (*)    CO2 21 (*)    Glucose, Bld 139 (*)    All other components within normal limits  GLUCOSE, CAPILLARY - Abnormal; Notable for the following components:   Glucose-Capillary 140 (*)    All other components within normal limits  CBC  TYPE AND SCREEN     EKG 07/22/23:  NSR   CT Cardiac 08/05/23:  IMPRESSION: Coronary calcium score of 35.9. This was 58th percentile for age-, race-, and sex-matched controls.   Echo 07/16/23:  IMPRESSIONS    1. Left ventricular ejection fraction, by estimation, is 55 to 60%. The left ventricle has normal function. The left ventricle has no regional wall motion abnormalities. Left ventricular diastolic parameters are consistent with Grade I diastolic dysfunction (impaired relaxation). The average left ventricular global longitudinal strain is -21.3 %. The global longitudinal strain is normal.  2. Right ventricular systolic function is normal. The right ventricular size is normal. There is normal pulmonary artery systolic pressure.  3. The mitral valve is normal in structure. Trivial mitral valve regurgitation. No evidence of mitral stenosis.  4. The aortic valve is tricuspid. There is mild calcification of the aortic valve. There is mild thickening of the aortic valve. Aortic valve regurgitation is moderate to severe. No aortic stenosis is present.  5. The inferior vena cava is normal in size with greater than 50% respiratory variability, suggesting right atrial pressure of  3 mmHg.  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/2015   Chronic kidney disease    Depression    Eczema    hx: of   GERD (gastroesophageal reflux disease)    Gout    Heart murmur    ECHO 07-2023   Hypertension    Hypothyroidism    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   Osteoarthritis of both feet 11/22/2015   Osteoarthritis of both knees 11/22/2015   Pneumonia     Pre-diabetes    Shoulder impingement, left 11/22/2015   Sleep apnea     Past Surgical History:  Procedure Laterality Date   AV FISTULA PLACEMENT Left 03/18/2013   Procedure: ARTERIOVENOUS (AV) FISTULA CREATION- BRACHIOCEPHALIC;  Surgeon: Lynwood JONETTA Collum, MD;  Location: [facility] OR;  Service: Vascular;  Laterality: Left;   CATARACT EXTRACTION     01/2018 left 07/2018 right    COLONOSCOPY  11/25/2014   Colon polyp, staus post polypectomy. Internal Hemorrhoids. Otherwise normal colonoscopy to terminal ileum   HEMORRHOID SURGERY     INGUINAL HERNIA REPAIR Right 02/12/2023   Procedure: OPEN RIGHT INGUINALHERNIA REPAIR WITH MESH;  Surgeon: Vernetta Berg, MD;  Location: [facility];  Service: General;  Laterality: Right;   KIDNEY TRANSPLANT  2020   RENAL BIOPSY     SHOULDER SURGERY Right 2015   UPPER GI ENDOSCOPY      MEDICATIONS:  acetaminophen  (TYLENOL ) 500 MG tablet   ALPRAZolam (XANAX) 0.25 MG tablet   amoxicillin (AMOXIL) 500 MG capsule   aspirin EC 81 MG tablet   benzonatate (TESSALON) 200 MG capsule   Blood Glucose Monitoring Suppl (ONETOUCH VERIO) w/Device KIT   Cholecalciferol (VITAMIN D) 50 MCG (2000 UT) CAPS   clobetasol ointment (TEMOVATE) 0.05 %   insulin  glargine (LANTUS SOLOSTAR) 100 UNIT/ML Solostar Pen   insulin  lispro (HUMALOG) 100 UNIT/ML KwikPen   Insulin  Syringe-Needle U-100 31G X 15/64 0.3 ML MISC   Lancets (ONETOUCH DELICA PLUS LANCET33G) MISC   lansoprazole  (PREVACID ) 30 MG capsule   levothyroxine (SYNTHROID) 137 MCG tablet   lisinopril (ZESTRIL) 5 MG tablet   melatonin 5 MG TABS   mycophenolate  (CELLCEPT ) 250 MG capsule   ONETOUCH VERIO test strip   polyethylene glycol (MIRALAX / GLYCOLAX) 17 g packet   predniSONE  (DELTASONE ) 5 MG tablet   rosuvastatin (CRESTOR) 5 MG tablet   sitaGLIPtin (JANUVIA) 100 MG tablet   tacrolimus  (PROGRAF ) 0.5 MG capsule   tamsulosin (FLOMAX) 0.4 MG CAPS capsule   traMADol  (ULTRAM ) 50 MG tablet   traMADol  (ULTRAM ) 50 MG tablet    No current facility-administered medications for this encounter.

## 2023-12-24 NOTE — Anesthesia Preprocedure Evaluation (Signed)
 Anesthesia Evaluation  Patient identified by MRN, date of birth, ID band Patient awake  General Assessment Comment:  Patient had tea with milk at 0730.  Reviewed: Allergy & Precautions, NPO status , Patient's Chart, lab work & pertinent test results  History of Anesthesia Complications Negative for: history of anesthetic complications  Airway Mallampati: II  TM Distance: >3 FB Neck ROM: Full    Dental no notable dental hx. (+) Teeth Intact   Pulmonary sleep apnea , neg COPD, Patient abstained from smoking.Not current smoker   Pulmonary exam normal breath sounds clear to auscultation       Cardiovascular Exercise Tolerance: Good METShypertension, Pt. on medications (-) CAD and (-) Past MI (-) dysrhythmias  Rhythm:Regular Rate:Normal - Systolic murmurs    Neuro/Psych  PSYCHIATRIC DISORDERS Anxiety Depression    negative neurological ROS     GI/Hepatic ,GERD  Controlled,,(+)     (-) substance abuse    Endo/Other  neg diabetesHypothyroidism    Renal/GU Renal diseaseS/p renal transplant. On prednisone  5mg  daily along with immunosuppressants. No longer on dialysis     Musculoskeletal  (+) Arthritis ,    Abdominal   Peds  Hematology   Anesthesia Other Findings Past Medical History: No date: Anemia No date: Anxiety No date: Arthritis 11/22/2015: Chondromalacia, patella No date: Chronic kidney disease No date: Depression No date: Eczema     Comment:  hx: of No date: GERD (gastroesophageal reflux disease) No date: Gout No date: Heart murmur     Comment:  ECHO 07-2023 No date: Hypertension No date: Hypothyroidism 11/22/2015: 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: Osteoarthritis of both feet 11/22/2015: Osteoarthritis of both knees No date: Pneumonia No date: Pre-diabetes 11/22/2015: Shoulder impingement, left No date: Sleep apnea  Reproductive/Obstetrics                               Anesthesia Physical Anesthesia Plan  ASA: 3  Anesthesia Plan: Spinal   Post-op Pain Management: Tylenol  PO (pre-op )*   Induction: Intravenous  PONV Risk Score and Plan: 1 and Ondansetron , Dexamethasone , Propofol  infusion, TIVA, Treatment may vary due to age or medical condition and Midazolam   Airway Management Planned: Natural Airway  Additional Equipment: None  Intra-op Plan:   Post-operative Plan:   Informed Consent: I have reviewed the patients History and Physical, chart, labs and discussed the procedure including the risks, benefits and alternatives for the proposed anesthesia with the patient or authorized representative who has indicated his/her understanding and acceptance.       Plan Discussed with: CRNA and Surgeon  Anesthesia Plan Comments: (Discussed with surgeon patient's inappropriate NPO status. Will proceed 6hrs after last milk ingestion which would make it 1330.  Discussed R/B/A of neuraxial anesthesia technique with patient: - rare risks of spinal/epidural hematoma, nerve damage, infection - Risk of PDPH - Risk of difficulty with spontaneous respiration requiring airway intervention and its associated risks - PONV  Patient understands.  )         Anesthesia Quick Evaluation

## 2023-12-31 ENCOUNTER — Ambulatory Visit (HOSPITAL_COMMUNITY): Admitting: Anesthesiology

## 2023-12-31 ENCOUNTER — Encounter (HOSPITAL_COMMUNITY): Admission: RE | Disposition: A | Payer: Self-pay | Source: Ambulatory Visit | Attending: Orthopedic Surgery

## 2023-12-31 ENCOUNTER — Encounter (HOSPITAL_COMMUNITY): Payer: Self-pay | Admitting: Medical

## 2023-12-31 ENCOUNTER — Other Ambulatory Visit: Payer: Self-pay

## 2023-12-31 ENCOUNTER — Encounter (HOSPITAL_COMMUNITY): Payer: Self-pay | Admitting: Orthopedic Surgery

## 2023-12-31 ENCOUNTER — Ambulatory Visit (HOSPITAL_COMMUNITY)
Admission: RE | Admit: 2023-12-31 | Discharge: 2024-01-02 | Disposition: A | Discharge status: Home / Self Care | Source: Ambulatory Visit | Attending: Orthopedic Surgery | Admitting: Orthopedic Surgery

## 2023-12-31 ENCOUNTER — Ambulatory Visit (HOSPITAL_COMMUNITY)

## 2023-12-31 DIAGNOSIS — Z79899 Other long term (current) drug therapy: Secondary | ICD-10-CM | POA: Diagnosis not present

## 2023-12-31 DIAGNOSIS — E039 Hypothyroidism, unspecified: Secondary | ICD-10-CM | POA: Diagnosis not present

## 2023-12-31 DIAGNOSIS — D62 Acute posthemorrhagic anemia: Secondary | ICD-10-CM | POA: Insufficient documentation

## 2023-12-31 DIAGNOSIS — I10 Essential (primary) hypertension: Secondary | ICD-10-CM | POA: Diagnosis not present

## 2023-12-31 DIAGNOSIS — Z79624 Long term (current) use of inhibitors of nucleotide synthesis: Secondary | ICD-10-CM | POA: Diagnosis not present

## 2023-12-31 DIAGNOSIS — Z79621 Long term (current) use of calcineurin inhibitor: Secondary | ICD-10-CM | POA: Diagnosis not present

## 2023-12-31 DIAGNOSIS — M1611 Unilateral primary osteoarthritis, right hip: Secondary | ICD-10-CM | POA: Insufficient documentation

## 2023-12-31 DIAGNOSIS — Z01818 Encounter for other preprocedural examination: Secondary | ICD-10-CM

## 2023-12-31 DIAGNOSIS — K219 Gastro-esophageal reflux disease without esophagitis: Secondary | ICD-10-CM | POA: Insufficient documentation

## 2023-12-31 DIAGNOSIS — E871 Hypo-osmolality and hyponatremia: Secondary | ICD-10-CM | POA: Diagnosis not present

## 2023-12-31 DIAGNOSIS — F32A Depression, unspecified: Secondary | ICD-10-CM | POA: Diagnosis not present

## 2023-12-31 DIAGNOSIS — F419 Anxiety disorder, unspecified: Secondary | ICD-10-CM | POA: Insufficient documentation

## 2023-12-31 DIAGNOSIS — Z7952 Long term (current) use of systemic steroids: Secondary | ICD-10-CM | POA: Insufficient documentation

## 2023-12-31 DIAGNOSIS — E119 Type 2 diabetes mellitus without complications: Secondary | ICD-10-CM

## 2023-12-31 DIAGNOSIS — Z940 Kidney transplant status: Secondary | ICD-10-CM | POA: Insufficient documentation

## 2023-12-31 DIAGNOSIS — Z96641 Presence of right artificial hip joint: Secondary | ICD-10-CM

## 2023-12-31 HISTORY — PX: TOTAL HIP ARTHROPLASTY: SHX124

## 2023-12-31 LAB — ABO/RH: ABO/RH(D): B POS

## 2023-12-31 LAB — GLUCOSE, CAPILLARY
Glucose-Capillary: 147 mg/dL — ABNORMAL HIGH (ref 70–99)
Glucose-Capillary: 208 mg/dL — ABNORMAL HIGH (ref 70–99)

## 2023-12-31 LAB — TYPE AND SCREEN
ABO/RH(D): B POS
Antibody Screen: NEGATIVE

## 2023-12-31 SURGERY — ARTHROPLASTY, HIP, TOTAL, ANTERIOR APPROACH
Anesthesia: Spinal | Site: Hip | Laterality: Right

## 2023-12-31 MED ORDER — CHLORHEXIDINE GLUCONATE 0.12 % MT SOLN
15.0000 mL | Freq: Once | OROMUCOSAL | Status: AC
  Administered 2023-12-31: 10:00:00 15 mL via OROMUCOSAL

## 2023-12-31 MED ORDER — LACTATED RINGERS IV SOLN: INTRAVENOUS | Status: DC

## 2023-12-31 MED ORDER — PHENYLEPHRINE HCL-NACL 20-0.9 MG/250ML-% IV SOLN
INTRAVENOUS | Status: AC
  Filled 2023-12-31: qty 250

## 2023-12-31 MED ORDER — CEFAZOLIN SODIUM-DEXTROSE 2-4 GM/100ML-% IV SOLN
2.0000 g | Freq: Four times a day (QID) | INTRAVENOUS | Status: AC
  Administered 2023-12-31 – 2024-01-01 (×2): 2 g via INTRAVENOUS
  Filled 2023-12-31 (×2): qty 100

## 2023-12-31 MED ORDER — ACETAMINOPHEN 500 MG PO TABS
500.0000 mg | ORAL_TABLET | Freq: Four times a day (QID) | ORAL | Status: AC
  Administered 2023-12-31 – 2024-01-01 (×4): 500 mg via ORAL
  Filled 2023-12-31 (×4): qty 1

## 2023-12-31 MED ORDER — MAGNESIUM CITRATE PO SOLN: 1.0000 | Freq: Once | ORAL | Status: DC | PRN

## 2023-12-31 MED ORDER — PHENOL 1.4 % MT LIQD: 1.0000 | OROMUCOSAL | Status: DC | PRN

## 2023-12-31 MED ORDER — BUPIVACAINE-EPINEPHRINE (PF) 0.25% -1:200000 IJ SOLN
INTRAMUSCULAR | Status: AC
  Filled 2023-12-31: qty 30

## 2023-12-31 MED ORDER — BENZONATATE 100 MG PO CAPS
200.0000 mg | ORAL_CAPSULE | Freq: Every day | ORAL | Status: DC
  Administered 2023-12-31 – 2024-01-02 (×2): 200 mg via ORAL
  Filled 2023-12-31 (×2): qty 2

## 2023-12-31 MED ORDER — KETOROLAC TROMETHAMINE 30 MG/ML IJ SOLN
INTRAMUSCULAR | Status: DC | PRN
  Administered 2023-12-31: 15:00:00 61 mL

## 2023-12-31 MED ORDER — ONDANSETRON HCL 4 MG/2ML IJ SOLN
INTRAMUSCULAR | Status: DC | PRN
  Administered 2023-12-31: 15:00:00 4 mg via INTRAVENOUS

## 2023-12-31 MED ORDER — POVIDONE-IODINE 10 % EX SWAB: 2.0000 | Freq: Once | CUTANEOUS | Status: DC

## 2023-12-31 MED ORDER — DIPHENHYDRAMINE HCL 12.5 MG/5ML PO ELIX: 12.5000 mg | ORAL_SOLUTION | ORAL | Status: DC | PRN

## 2023-12-31 MED ORDER — LINAGLIPTIN 5 MG PO TABS
5.0000 mg | ORAL_TABLET | Freq: Every day | ORAL | Status: DC
  Administered 2024-01-01 – 2024-01-02 (×2): 5 mg via ORAL
  Filled 2023-12-31 (×2): qty 1

## 2023-12-31 MED ORDER — LIDOCAINE HCL (PF) 2 % IJ SOLN
INTRAMUSCULAR | Status: DC | PRN
  Administered 2023-12-31: 14:00:00 20 mg via INTRADERMAL
  Administered 2023-12-31: 13:00:00 40 mg via INTRADERMAL

## 2023-12-31 MED ORDER — ONDANSETRON HCL 4 MG PO TABS: 4.0000 mg | ORAL_TABLET | Freq: Four times a day (QID) | ORAL | Status: DC | PRN

## 2023-12-31 MED ORDER — KETOROLAC TROMETHAMINE 30 MG/ML IJ SOLN
INTRAMUSCULAR | Status: AC
  Filled 2023-12-31: qty 1

## 2023-12-31 MED ORDER — POLYETHYLENE GLYCOL 3350 17 G PO PACK: 17.0000 g | PACK | Freq: Every day | ORAL | Status: DC | PRN

## 2023-12-31 MED ORDER — OXYCODONE HCL 5 MG PO TABS: 5.0000 mg | ORAL_TABLET | Freq: Once | ORAL | Status: DC | PRN

## 2023-12-31 MED ORDER — SORBITOL 70 % SOLN: 30.0000 mL | Freq: Every day | Status: DC | PRN

## 2023-12-31 MED ORDER — ALUM & MAG HYDROXIDE-SIMETH 200-200-20 MG/5ML PO SUSP
30.0000 mL | ORAL | Status: DC | PRN
  Administered 2024-01-01: 22:00:00 30 mL via ORAL
  Filled 2023-12-31: qty 30

## 2023-12-31 MED ORDER — PHENYLEPHRINE 80 MCG/ML (10ML) SYRINGE FOR IV PUSH (FOR BLOOD PRESSURE SUPPORT)
PREFILLED_SYRINGE | INTRAVENOUS | Status: DC | PRN
  Administered 2023-12-31 (×3): 80 ug via INTRAVENOUS

## 2023-12-31 MED ORDER — CEFAZOLIN SODIUM-DEXTROSE 2-4 GM/100ML-% IV SOLN
2.0000 g | INTRAVENOUS | Status: AC
  Administered 2023-12-31: 13:00:00 2 g via INTRAVENOUS
  Filled 2023-12-31: qty 100

## 2023-12-31 MED ORDER — PHENYLEPHRINE HCL-NACL 20-0.9 MG/250ML-% IV SOLN
INTRAVENOUS | Status: DC | PRN
  Administered 2023-12-31: 14:00:00 40 ug/min via INTRAVENOUS

## 2023-12-31 MED ORDER — HYDROCORTISONE SOD SUC (PF) 100 MG IJ SOLR: 50.0000 mg | Freq: Two times a day (BID) | INTRAMUSCULAR | Status: DC

## 2023-12-31 MED ORDER — ISOPROPYL ALCOHOL 70 % SOLN
Status: DC | PRN
  Administered 2023-12-31: 14:00:00 1 via TOPICAL

## 2023-12-31 MED ORDER — INSULIN GLARGINE-YFGN 100 UNIT/ML ~~LOC~~ SOLN
5.0000 [IU] | Freq: Every day | SUBCUTANEOUS | Status: DC
  Administered 2024-01-01 – 2024-01-02 (×2): 5 [IU] via SUBCUTANEOUS
  Filled 2023-12-31 (×2): qty 0.05

## 2023-12-31 MED ORDER — OXYCODONE HCL 5 MG/5ML PO SOLN: 5.0000 mg | Freq: Once | ORAL | Status: DC | PRN

## 2023-12-31 MED ORDER — TRANEXAMIC ACID-NACL 1000-0.7 MG/100ML-% IV SOLN
1000.0000 mg | INTRAVENOUS | Status: AC
  Administered 2023-12-31: 14:00:00 1000 mg via INTRAVENOUS
  Filled 2023-12-31: qty 100

## 2023-12-31 MED ORDER — ONDANSETRON HCL 4 MG/2ML IJ SOLN
INTRAMUSCULAR | Status: AC
  Filled 2023-12-31: qty 2

## 2023-12-31 MED ORDER — TAMSULOSIN HCL 0.4 MG PO CAPS
0.4000 mg | ORAL_CAPSULE | Freq: Every day | ORAL | Status: DC
  Administered 2023-12-31 – 2024-01-01 (×2): 0.4 mg via ORAL
  Filled 2023-12-31 (×2): qty 1

## 2023-12-31 MED ORDER — PANTOPRAZOLE SODIUM 40 MG PO TBEC: 40.0000 mg | DELAYED_RELEASE_TABLET | Freq: Every day | ORAL | Status: DC

## 2023-12-31 MED ORDER — LIDOCAINE HCL (PF) 2 % IJ SOLN
INTRAMUSCULAR | Status: AC
  Filled 2023-12-31: qty 5

## 2023-12-31 MED ORDER — BUPIVACAINE IN DEXTROSE 0.75-8.25 % IT SOLN
INTRATHECAL | Status: DC | PRN
  Administered 2023-12-31: 13:00:00 1.8 mL via INTRATHECAL

## 2023-12-31 MED ORDER — ACETAMINOPHEN 325 MG PO TABS
325.0000 mg | ORAL_TABLET | Freq: Four times a day (QID) | ORAL | Status: DC | PRN
  Filled 2023-12-31: qty 2

## 2023-12-31 MED ORDER — TACROLIMUS 1 MG PO CAPS: 1.0000 mg | ORAL_CAPSULE | Freq: Two times a day (BID) | ORAL | Status: DC

## 2023-12-31 MED ORDER — MUPIROCIN 2 % EX OINT: 1.0000 | TOPICAL_OINTMENT | Freq: Two times a day (BID) | CUTANEOUS | 0 refills | Status: AC

## 2023-12-31 MED ORDER — DROPERIDOL 2.5 MG/ML IJ SOLN: 0.6250 mg | Freq: Once | INTRAMUSCULAR | Status: DC | PRN

## 2023-12-31 MED ORDER — VANCOMYCIN HCL 1000 MG IV SOLR
INTRAVENOUS | Status: AC
  Filled 2023-12-31: qty 20

## 2023-12-31 MED ORDER — INSULIN ASPART 100 UNIT/ML IJ SOLN
0.0000 [IU] | Freq: Every day | INTRAMUSCULAR | Status: DC
  Administered 2023-12-31: 23:00:00 2 [IU] via SUBCUTANEOUS
  Filled 2023-12-31: qty 2

## 2023-12-31 MED ORDER — MENTHOL 3 MG MT LOZG: 1.0000 | LOZENGE | OROMUCOSAL | Status: DC | PRN

## 2023-12-31 MED ORDER — VANCOMYCIN HCL 1000 MG IV SOLR
INTRAVENOUS | Status: DC | PRN
  Administered 2023-12-31: 15:00:00 1000 mg via TOPICAL

## 2023-12-31 MED ORDER — METOCLOPRAMIDE HCL 5 MG/ML IJ SOLN: 5.0000 mg | Freq: Three times a day (TID) | INTRAMUSCULAR | Status: DC | PRN

## 2023-12-31 MED ORDER — SODIUM CHLORIDE 0.9 % IR SOLN
Status: DC | PRN
  Administered 2023-12-31: 14:00:00 250 mL
  Administered 2023-12-31: 14:00:00 3000 mL

## 2023-12-31 MED ORDER — ALPRAZOLAM 0.25 MG PO TABS: 0.2500 mg | ORAL_TABLET | ORAL | Status: DC

## 2023-12-31 MED ORDER — TACROLIMUS 1 MG PO CAPS
1.0000 mg | ORAL_CAPSULE | Freq: Two times a day (BID) | ORAL | Status: DC
  Administered 2023-12-31 – 2024-01-02 (×4): 1 mg via ORAL
  Filled 2023-12-31 (×4): qty 1

## 2023-12-31 MED ORDER — FENTANYL CITRATE (PF) 100 MCG/2ML IJ SOLN
INTRAMUSCULAR | Status: AC
  Filled 2023-12-31: qty 2

## 2023-12-31 MED ORDER — HYDROCODONE-ACETAMINOPHEN 7.5-325 MG PO TABS
1.0000 | ORAL_TABLET | ORAL | Status: DC | PRN
  Administered 2024-01-01: 05:00:00 1 via ORAL
  Administered 2024-01-02: 2 via ORAL
  Filled 2023-12-31: qty 2
  Filled 2023-12-31: qty 1

## 2023-12-31 MED ORDER — ISOPROPYL ALCOHOL 70 % SOLN
Status: AC
  Filled 2023-12-31: qty 480

## 2023-12-31 MED ORDER — FENTANYL CITRATE (PF) 50 MCG/ML IJ SOSY: 25.0000 ug | PREFILLED_SYRINGE | INTRAMUSCULAR | Status: DC | PRN

## 2023-12-31 MED ORDER — PREDNISONE 5 MG PO TABS
5.0000 mg | ORAL_TABLET | Freq: Every day | ORAL | Status: DC
  Administered 2024-01-02: 5 mg via ORAL
  Filled 2023-12-31: qty 1

## 2023-12-31 MED ORDER — DOCUSATE SODIUM 100 MG PO CAPS
100.0000 mg | ORAL_CAPSULE | Freq: Two times a day (BID) | ORAL | Status: DC
  Administered 2023-12-31 – 2024-01-02 (×4): 100 mg via ORAL
  Filled 2023-12-31 (×4): qty 1

## 2023-12-31 MED ORDER — INSULIN ASPART 100 UNIT/ML IJ SOLN
0.0000 [IU] | Freq: Three times a day (TID) | INTRAMUSCULAR | Status: DC
  Administered 2024-01-01 (×2): 2 [IU] via SUBCUTANEOUS
  Administered 2024-01-01 – 2024-01-02 (×2): 1 [IU] via SUBCUTANEOUS
  Filled 2023-12-31: qty 1
  Filled 2023-12-31: qty 2
  Filled 2023-12-31: qty 1
  Filled 2023-12-31: qty 2

## 2023-12-31 MED ORDER — METHOCARBAMOL 1000 MG/10ML IJ SOLN: 500.0000 mg | Freq: Four times a day (QID) | INTRAMUSCULAR | Status: DC | PRN

## 2023-12-31 MED ORDER — PREDNISONE 5 MG PO TABS: 5.0000 mg | ORAL_TABLET | Freq: Every day | ORAL | Status: DC

## 2023-12-31 MED ORDER — SODIUM CHLORIDE 0.9 % IV SOLN: INTRAVENOUS | Status: DC

## 2023-12-31 MED ORDER — MYCOPHENOLATE MOFETIL 250 MG PO CAPS
1000.0000 mg | ORAL_CAPSULE | Freq: Two times a day (BID) | ORAL | Status: DC
  Administered 2023-12-31 – 2024-01-02 (×4): 1000 mg via ORAL
  Filled 2023-12-31 (×4): qty 4

## 2023-12-31 MED ORDER — CHLORHEXIDINE GLUCONATE 4 % EX SOLN: 1.0000 | CUTANEOUS | 1 refills | Status: AC

## 2023-12-31 MED ORDER — PROPOFOL 1000 MG/100ML IV EMUL
INTRAVENOUS | Status: AC
  Filled 2023-12-31: qty 100

## 2023-12-31 MED ORDER — ACETAMINOPHEN 10 MG/ML IV SOLN: 1000.0000 mg | Freq: Once | INTRAVENOUS | Status: DC | PRN

## 2023-12-31 MED ORDER — ACETAMINOPHEN 500 MG PO TABS
1000.0000 mg | ORAL_TABLET | Freq: Once | ORAL | Status: AC
  Administered 2023-12-31: 13:00:00 500 mg via ORAL
  Filled 2023-12-31: qty 2

## 2023-12-31 MED ORDER — ORAL CARE MOUTH RINSE: 15.0000 mL | Freq: Once | OROMUCOSAL | Status: AC

## 2023-12-31 MED ORDER — MORPHINE SULFATE (PF) 2 MG/ML IV SOLN: 0.5000 mg | INTRAVENOUS | Status: DC | PRN

## 2023-12-31 MED ORDER — METHOCARBAMOL 500 MG PO TABS
500.0000 mg | ORAL_TABLET | Freq: Four times a day (QID) | ORAL | Status: DC | PRN
  Administered 2023-12-31 – 2024-01-02 (×4): 500 mg via ORAL
  Filled 2023-12-31 (×5): qty 1

## 2023-12-31 MED ORDER — VITAMIN D 25 MCG (1000 UNIT) PO TABS
2000.0000 [IU] | ORAL_TABLET | Freq: Every day | ORAL | Status: DC
  Administered 2024-01-01 – 2024-01-02 (×2): 2000 [IU] via ORAL
  Filled 2023-12-31 (×2): qty 2

## 2023-12-31 MED ORDER — WATER FOR IRRIGATION, STERILE IR SOLN
Status: DC | PRN
  Administered 2023-12-31: 14:00:00 2000 mL

## 2023-12-31 MED ORDER — HYDROCODONE-ACETAMINOPHEN 5-325 MG PO TABS
1.0000 | ORAL_TABLET | ORAL | Status: DC | PRN
  Administered 2023-12-31 – 2024-01-02 (×6): 1 via ORAL
  Filled 2023-12-31 (×6): qty 1

## 2023-12-31 MED ORDER — MIDAZOLAM HCL 2 MG/2ML IJ SOLN
INTRAMUSCULAR | Status: AC
  Filled 2023-12-31: qty 2

## 2023-12-31 MED ORDER — ONDANSETRON HCL 4 MG/2ML IJ SOLN: 4.0000 mg | Freq: Four times a day (QID) | INTRAMUSCULAR | Status: DC | PRN

## 2023-12-31 MED ORDER — HYDROCORTISONE SOD SUC (PF) 100 MG IJ SOLR
50.0000 mg | Freq: Two times a day (BID) | INTRAMUSCULAR | Status: AC
  Administered 2023-12-31 – 2024-01-01 (×2): 50 mg via INTRAVENOUS
  Filled 2023-12-31 (×2): qty 1

## 2023-12-31 MED ORDER — SODIUM CHLORIDE (PF) 0.9 % IJ SOLN
INTRAMUSCULAR | Status: AC
  Filled 2023-12-31: qty 30

## 2023-12-31 MED ORDER — MIDAZOLAM HCL 5 MG/5ML IJ SOLN
INTRAMUSCULAR | Status: DC | PRN
  Administered 2023-12-31: 13:00:00 1 mg via INTRAVENOUS

## 2023-12-31 MED ORDER — METOCLOPRAMIDE HCL 5 MG PO TABS: 5.0000 mg | ORAL_TABLET | Freq: Three times a day (TID) | ORAL | Status: DC | PRN

## 2023-12-31 MED ADMIN — Propofol IV Emul 500 MG/50ML (10 MG/ML): 10 mg | INTRAVENOUS | @ 14:00:00 | NDC 00069024801

## 2023-12-31 MED ADMIN — Propofol IV Emul 500 MG/50ML (10 MG/ML): 10 ug/kg/min | INTRAVENOUS | @ 13:00:00 | NDC 00069024801

## 2023-12-31 SURGICAL SUPPLY — 51 items
BAG COUNTER SPONGE SURGICOUNT (BAG) IMPLANT
BAG ZIPLOCK 12X15 (MISCELLANEOUS) ×1 IMPLANT
BIT DRILL RINGLOC 3.2MMX20 (BIT) IMPLANT
BIT DRILL RINGLOC QUICK CONN (BIT) IMPLANT
BLADE SAW SGTL 18X104X1.24 (BLADE) ×1 IMPLANT
CHLORAPREP W/TINT 26 (MISCELLANEOUS) ×1 IMPLANT
COVER PERINEAL POST (MISCELLANEOUS) ×1 IMPLANT
COVER SURGICAL LIGHT HANDLE (MISCELLANEOUS) ×1 IMPLANT
DERMABOND ADVANCED .7 DNX12 (GAUZE/BANDAGES/DRESSINGS) ×1 IMPLANT
DRAPE IMP U-DRAPE 54X76 (DRAPES) ×1 IMPLANT
DRAPE SHEET LG 3/4 BI-LAMINATE (DRAPES) ×3 IMPLANT
DRAPE STERI IOBAN 125X83 (DRAPES) ×1 IMPLANT
DRAPE U-SHAPE 47X51 STRL (DRAPES) ×2 IMPLANT
DRSG AQUACEL AG ADV 3.5X10 (GAUZE/BANDAGES/DRESSINGS) ×1 IMPLANT
ELECT REM PT RETURN 15FT ADLT (MISCELLANEOUS) ×1 IMPLANT
GAUZE SPONGE 4X4 12PLY STRL (GAUZE/BANDAGES/DRESSINGS) ×1 IMPLANT
GLOVE BIO SURGEON STRL SZ7 (GLOVE) ×1 IMPLANT
GLOVE BIO SURGEON STRL SZ8.5 (GLOVE) ×2 IMPLANT
GLOVE BIOGEL PI IND STRL 7.5 (GLOVE) ×1 IMPLANT
GLOVE BIOGEL PI IND STRL 8.5 (GLOVE) ×1 IMPLANT
GOWN SPEC L3 XXLG W/TWL (GOWN DISPOSABLE) ×1 IMPLANT
GOWN STRL REUS W/ TWL XL LVL3 (GOWN DISPOSABLE) ×1 IMPLANT
HEAD CERAMIC BIOLOX 36MM (Head) IMPLANT
HOLDER FOLEY CATH W/STRAP (MISCELLANEOUS) ×1 IMPLANT
HOOD PEEL AWAY T7 (MISCELLANEOUS) ×3 IMPLANT
KIT TURNOVER KIT A (KITS) ×1 IMPLANT
LINER ACETAB VIT E +5 36 F (Liner) IMPLANT
MANIFOLD NEPTUNE II (INSTRUMENTS) ×1 IMPLANT
MARKER SKIN DUAL TIP RULER LAB (MISCELLANEOUS) ×1 IMPLANT
NDL SAFETY ECLIPSE 18X1.5 (NEEDLE) ×1 IMPLANT
NDL SPNL 18GX3.5 QUINCKE PK (NEEDLE) ×1 IMPLANT
NEEDLE SPNL 18GX3.5 QUINCKE PK (NEEDLE) ×1 IMPLANT
PACK ANTERIOR HIP CUSTOM (KITS) ×1 IMPLANT
PENCIL SMOKE EVACUATOR (MISCELLANEOUS) ×1 IMPLANT
SCREW BONE SELF TAP 6.5X35MM (Screw) IMPLANT
SEALER BIPOLAR AQUA 6.0 (INSTRUMENTS) ×1 IMPLANT
SET HNDPC FAN SPRY TIP SCT (DISPOSABLE) ×1 IMPLANT
SHELL ACETAB 54 4H HIP (Shell) IMPLANT
SLEEVE HIP BIOLOX -6MM OFFSET (Sleeve) IMPLANT
SOLUTION PRONTOSAN WOUND 350ML (IRRIGATION / IRRIGATOR) ×1 IMPLANT
SPIKE FLUID TRANSFER (MISCELLANEOUS) ×1 IMPLANT
STAPLER SKIN PROX WIDE 3.9 (STAPLE) IMPLANT
STEM FEM CMTLS 9X102.5 133D (Stem) IMPLANT
SUT MNCRL AB 3-0 PS2 18 (SUTURE) ×1 IMPLANT
SUT MON AB 2-0 CT1 36 (SUTURE) ×1 IMPLANT
SUT STRATAFIX 14 PDO 48 VLT (SUTURE) ×1 IMPLANT
SUT VIC AB 2-0 CT1 TAPERPNT 27 (SUTURE) IMPLANT
SYR 3ML LL SCALE MARK (SYRINGE) ×1 IMPLANT
TOWEL GREEN STERILE FF (TOWEL DISPOSABLE) ×1 IMPLANT
TRAY FOLEY MTR SLVR 16FR STAT (SET/KITS/TRAYS/PACK) IMPLANT
TUBE SUCTION HIGH CAP CLEAR NV (SUCTIONS) ×1 IMPLANT

## 2023-12-31 NOTE — Discharge Instructions (Addendum)

## 2023-12-31 NOTE — Op Note (Signed)
 OPERATIVE REPORT  SURGEON: Redell Shoals, MD   ASSISTANT: Valery Potters, PA-C.  PREOPERATIVE DIAGNOSIS: Right hip arthritis.   POSTOPERATIVE DIAGNOSIS: Right hip arthritis.   PROCEDURE: Right total hip arthroplasty, anterior approach.   IMPLANTS: Biomet Taperloc Complete Microplasty stem, size 9 x 105.5 mm, high offset. Biomet G7 OsseoTi Cup, size 54 mm. Biomet Vivacit-E liner, size 36 mm, F, +5 mm neutral. Biomet Biolox ceramic head ball, size 36 - 6 mm. 6.53mm bone screw x1.  ANESTHESIA:  MAC and Spinal  ESTIMATED BLOOD LOSS:-175 mL    ANTIBIOTICS: 2g Ancef .  DRAINS: None.  COMPLICATIONS: None.   CONDITION: PACU - hemodynamically stable.   BRIEF CLINICAL NOTE: Colin Mcfarland is a 58 y.o. male with a long-standing history of Right hip arthritis. After failing conservative management, the patient was indicated for total hip arthroplasty. The risks, benefits, and alternatives to the procedure were explained, and the patient elected to proceed.  PROCEDURE IN DETAIL: Surgical site was marked by myself in the pre-op  holding area. Once inside the operating room, spinal anesthesia was obtained, and a foley catheter was inserted. The patient was then positioned on the Hana table.  All bony prominences were well padded.  The hip was prepped and draped in the normal sterile surgical fashion.  A time-out was called verifying side and site of surgery. The patient received IV antibiotics within 60 minutes of beginning the procedure.   Bikini incision was made, and superficial dissection was performed lateral to the ASIS. The direct anterior approach to the hip was performed through the Hueter interval.  Lateral femoral circumflex vessels were treated with the Auqumantys. The anterior capsule was exposed and an inverted T capsulotomy was made. The femoral neck cut was made to the level of the templated cut.  A corkscrew was placed into the head and the head was removed.  The femoral head was  found to have eburnated bone. The head was passed to the back table and was measured. Pubofemoral ligament was released off of the calcar, taking care to stay on bone. Superior capsule was released from the greater trochanter, taking care to stay lateral to the posterior border of the femoral neck in order to preserve the short external rotators.   Acetabular exposure was achieved, and the pulvinar and labrum were excised. Sequential reaming of the acetabulum was then performed up to a size 53 mm reamer. A 54 mm cup was then opened and impacted into place at approximately 40 degrees of abduction and 20 degrees of anteversion. I elected to augment the already acceptable press fit fixation with a single bone screw.The final polyethylene liner was impacted into place and acetabular osteophytes were removed.    I then gained femoral exposure taking care to protect the abductors and greater trochanter.  This was performed using standard external rotation, extension, and adduction.  A cookie cutter was used to enter the femoral canal, and then the femoral canal finder was placed.  Sequential broaching was performed up to a size 9.  Calcar planer was used on the femoral neck remnant.  I placed a high offset neck and a trial head ball.  The hip was reduced.  Leg lengths and offset were checked fluoroscopically.  The hip was dislocated and trial components were removed.  The final implants were placed, and the hip was reduced.  Fluoroscopy was used to confirm component position and leg lengths.  At 90 degrees of external rotation and full extension, the hip was stable to an anterior  directed force.   The wound was copiously irrigated with Prontosan solution and normal saline using pulse lavage.  Marcaine  solution was injected into the periarticular soft tissue.  1 g vancomycin  powder was placed into the joint.  The wound was closed in layers using #1 Vicryl and V-Loc for the fascia, 2-0 Vicryl for the subcutaneous fat,  2-0 Monocryl for the deep dermal layer, 3-0 running Monocryl subcuticular stitch, and Dermabond for the skin.  Once the glue was fully dried, an Aquacell Ag dressing was applied.  The patient was transported to the recovery room in stable condition.  Sponge, needle, and instrument counts were correct at the end of the case x2.  The patient tolerated the procedure well and there were no known complications.  Please note that a surgical assistant was a medical necessity for this procedure to perform it in a safe and expeditious manner. Assistant was necessary to provide appropriate retraction of vital neurovascular structures, to prevent femoral fracture, and to allow for anatomic placement of the prosthesis.

## 2023-12-31 NOTE — Interval H&P Note (Signed)
 History and Physical Interval Note:  12/31/2023 10:15 AM  Colin Mcfarland  has presented today for surgery, with the diagnosis of Right hip osteoarthritis.  The various methods of treatment have been discussed with the patient and family. After consideration of risks, benefits and other options for treatment, the patient has consented to  Procedures: ARTHROPLASTY, HIP, TOTAL, ANTERIOR APPROACH (Right) as a surgical intervention.  The patient's history has been reviewed, patient examined, no change in status, stable for surgery.  I have reviewed the patient's chart and labs.  Questions were answered to the patient's satisfaction.     [name] Chaia Ikard

## 2023-12-31 NOTE — Anesthesia Procedure Notes (Signed)
 Spinal  Patient location during procedure: OR Start time: 12/31/2023 1:20 PM End time: 12/31/2023 1:22 PM Reason for block: surgical anesthesia  Staffing Performed: anesthesiologist  Authorized by: Keneth Lynwood POUR, MD   Performed by: Keneth Lynwood POUR, MD  Preanesthetic Checklist Completed: patient identified, IV checked, site marked, risks and benefits discussed, surgical consent, monitors and equipment checked, pre-op  evaluation and timeout performed Spinal Block Patient position: sitting Prep: DuraPrep Patient monitoring: heart rate, cardiac monitor, continuous pulse ox and blood pressure Approach: midline Location: L3-4 Injection technique: single-shot Needle Needle type: Sprotte  Needle gauge: 24 G Needle length: 9 cm Assessment Sensory level: T4 Events: CSF return

## 2023-12-31 NOTE — Anesthesia Procedure Notes (Signed)
 Procedure Name: MAC Date/Time: 12/31/2023 1:27 PM  Performed by: Erick Fitz, CRNAPre-anesthesia Checklist: Patient identified, Emergency Drugs available, Suction available, Patient being monitored and Timeout performed Patient Re-evaluated:Patient Re-evaluated prior to induction Oxygen Delivery Method: Simple face mask Preoxygenation: Pre-oxygenation with 100% oxygen Induction Type: IV induction Placement Confirmation: positive ETCO2 and CO2 detector Dental Injury: Teeth and Oropharynx as per pre-operative assessment

## 2023-12-31 NOTE — Transfer of Care (Signed)
 Immediate Anesthesia Transfer of Care Note  Patient: Colin Mcfarland  Procedure(s) Performed: ARTHROPLASTY, HIP, TOTAL, ANTERIOR APPROACH (Right: Hip)  Patient Location: PACU  Anesthesia Type:MAC and Spinal  Level of Consciousness: awake and patient cooperative  Airway & Oxygen Therapy: Patient Spontanous Breathing and Patient connected to face mask oxygen  Post-op Assessment: Report given to RN and Post -op Vital signs reviewed and stable  Post vital signs: Reviewed and stable  Last Vitals:  Vitals Value Taken Time  BP 94/58 12/31/23 15:38  Temp    Pulse 74 12/31/23 15:40  Resp 23 12/31/23 15:40  SpO2 98 % 12/31/23 15:40  Vitals shown include unfiled device data.  Last Pain:  Vitals:   12/31/23 1232  TempSrc:   PainSc: 2       Patients Stated Pain Goal: 5 (12/31/23 1003)  Complications: No notable events documented.

## 2024-01-01 ENCOUNTER — Encounter (HOSPITAL_COMMUNITY): Payer: Self-pay | Admitting: Orthopedic Surgery

## 2024-01-01 DIAGNOSIS — M1611 Unilateral primary osteoarthritis, right hip: Secondary | ICD-10-CM | POA: Diagnosis not present

## 2024-01-01 LAB — BASIC METABOLIC PANEL WITH GFR
Anion gap: 11 (ref 5–15)
Anion gap: 10 (ref 5–15)
Anion gap: 9 (ref 5–15)
BUN: 12 mg/dL (ref 6–20)
BUN: 11 mg/dL (ref 6–20)
BUN: 11 mg/dL (ref 6–20)
CO2: 21 mmol/L — ABNORMAL LOW (ref 22–32)
CO2: 22 mmol/L (ref 22–32)
CO2: 22 mmol/L (ref 22–32)
Calcium: 8.7 mg/dL — ABNORMAL LOW (ref 8.9–10.3)
Calcium: 9 mg/dL (ref 8.9–10.3)
Calcium: 9 mg/dL (ref 8.9–10.3)
Chloride: 97 mmol/L — ABNORMAL LOW (ref 98–111)
Chloride: 94 mmol/L — ABNORMAL LOW (ref 98–111)
Chloride: 95 mmol/L — ABNORMAL LOW (ref 98–111)
Creatinine, Ser: 0.64 mg/dL (ref 0.61–1.24)
Creatinine, Ser: 0.63 mg/dL (ref 0.61–1.24)
Creatinine, Ser: 0.6 mg/dL — ABNORMAL LOW (ref 0.61–1.24)
GFR, Estimated: >60 mL/min (ref >60)
GFR, Estimated: >60 mL/min (ref >60)
GFR, Estimated: >60 mL/min (ref >60)
Glucose, Bld: 200 mg/dL — ABNORMAL HIGH (ref 70–99)
Glucose, Bld: 220 mg/dL — ABNORMAL HIGH (ref 70–99)
Glucose, Bld: 203 mg/dL — ABNORMAL HIGH (ref 70–99)
Potassium: 4.1 mmol/L (ref 3.5–5.1)
Potassium: 4.3 mmol/L (ref 3.5–5.1)
Potassium: 3.9 mmol/L (ref 3.5–5.1)
Sodium: 128 mmol/L — ABNORMAL LOW (ref 135–145)
Sodium: 126 mmol/L — ABNORMAL LOW (ref 135–145)
Sodium: 126 mmol/L — ABNORMAL LOW (ref 135–145)

## 2024-01-01 LAB — CBC
HCT: 32.4 % — ABNORMAL LOW (ref 39.0–52.0)
Hemoglobin: 11 g/dL — ABNORMAL LOW (ref 13.0–17.0)
MCH: 28.7 pg (ref 26.0–34.0)
MCHC: 34 g/dL (ref 30.0–36.0)
MCV: 84.6 fL (ref 80.0–100.0)
Platelets: 180 K/uL (ref 150–400)
RBC: 3.83 MIL/uL — ABNORMAL LOW (ref 4.22–5.81)
RDW: 12.7 % (ref 11.5–15.5)
WBC: 15.3 K/uL — ABNORMAL HIGH (ref 4.0–10.5)
nRBC: 0 % (ref 0.0–0.2)

## 2024-01-01 LAB — GLUCOSE, CAPILLARY
Glucose-Capillary: 228 mg/dL — ABNORMAL HIGH (ref 70–99)
Glucose-Capillary: 227 mg/dL — ABNORMAL HIGH (ref 70–99)
Glucose-Capillary: 196 mg/dL — ABNORMAL HIGH (ref 70–99)
Glucose-Capillary: 166 mg/dL — ABNORMAL HIGH (ref 70–99)

## 2024-01-01 MED ORDER — ASPIRIN 81 MG PO CHEW
81.0000 mg | CHEWABLE_TABLET | Freq: Two times a day (BID) | ORAL | Status: DC
  Administered 2024-01-01 – 2024-01-02 (×3): 81 mg via ORAL
  Filled 2024-01-01 (×3): qty 1

## 2024-01-01 MED ORDER — SODIUM CHLORIDE 1 G PO TABS
2.0000 g | ORAL_TABLET | Freq: Once | ORAL | Status: AC
  Administered 2024-01-01: 16:00:00 2 g via ORAL
  Filled 2024-01-01 (×2): qty 2

## 2024-01-01 NOTE — Progress Notes (Signed)
° ° °  Subjective:  Patient reports pain as moderate.  Denies N/V/CP/SOB/Abd pain. He denies tingling or numbness in LE bilaterally.  Catheter removed this am, void pending.  Family at bedside.  He reports some difficulty with pain overnight, doing better this am. Will continue to monitor.   Objective:   VITALS:   Vitals:   12/31/23 2044 01/01/24 0134 01/01/24 0205 01/01/24 0650  BP: (!) 149/78  (!) 156/76 133/64  Pulse: 73 75  77  Resp: 15 16  16   Temp: 97.7 F (36.5 C) 97.6 F (36.4 C)  98 F (36.7 C)  TempSrc: Oral Oral  Oral  SpO2: 100% 97%  100%  Weight:      Height:        NAD Neurologically intact ABD soft Neurovascular intact Sensation intact distally Intact pulses distally Dorsiflexion/Plantar flexion intact Incision: dressing C/D/I No cellulitis present Compartment soft   Lab Results  Component Value Date   WBC 15.3 (H) 01/01/2024   HGB 11.0 (L) 01/01/2024   HCT 32.4 (L) 01/01/2024   MCV 84.6 01/01/2024   PLT 180 01/01/2024   BMET    Component Value Date/Time   NA 128 (L) 01/01/2024 0336   K 4.1 01/01/2024 0336   CL 97 (L) 01/01/2024 0336   CO2 21 (L) 01/01/2024 0336   GLUCOSE 200 (H) 01/01/2024 0336   BUN 12 01/01/2024 0336   CREATININE 0.64 01/01/2024 0336   CREATININE 9.49 (H) 12/15/2015 0836   CALCIUM 8.7 (L) 01/01/2024 0336   GFRNONAA >60 01/01/2024 0336   GFRNONAA 6 (L) 12/15/2015 0836     Assessment/Plan: 1 Day Post-Op   Principal Problem:   Osteoarthritis of right hip  ABLA. Hemoglobin 11.0. Asymptomatic continue to monitor.   Hyponatremia sodium 128 (prior to admission 134). Patient reports has been low, he takes sodium tablets at home per his report. D/c IV fluids. Continue to monitor. Will recheck BMP this afternoon.   WBAT with walker DVT ppx: Aspirin , SCDs, TEDS PO pain control PT/OT: to come today.  Dispo:  - Recheck afternoon BMP, if sodium improved can d/c.  - D/c home with HEP once cleared with PT and  voided.   Colin Mcfarland 01/01/2024, 7:50 AM   [facility]  [place] [address], [place], [place] [address] Phone: [phone] [email or web address] Facebook  [employer]

## 2024-01-01 NOTE — Plan of Care (Signed)
°  Problem: Education: Goal: Knowledge of General Education information will improve Description: Including pain rating scale, medication(s)/side effects and non-pharmacologic comfort measures Outcome: Progressing   Problem: Health Behavior/Discharge Planning: Goal: Ability to manage health-related needs will improve Outcome: Progressing   Problem: Clinical Measurements: Goal: Ability to maintain clinical measurements within normal limits will improve Outcome: Progressing Goal: Will remain free from infection Outcome: Progressing Goal: Diagnostic test results will improve Outcome: Progressing Goal: Respiratory complications will improve Outcome: Progressing Goal: Cardiovascular complication will be avoided Outcome: Progressing   Problem: Activity: Goal: Risk for activity intolerance will decrease Outcome: Progressing   Problem: Nutrition: Goal: Adequate nutrition will be maintained Outcome: Progressing   Problem: Coping: Goal: Level of anxiety will decrease Outcome: Progressing   Problem: Elimination: Goal: Will not experience complications related to bowel motility Outcome: Progressing Goal: Will not experience complications related to urinary retention Outcome: Progressing   Problem: Pain Managment: Goal: General experience of comfort will improve and/or be controlled Outcome: Progressing   Problem: Safety: Goal: Ability to remain free from injury will improve Outcome: Progressing   Problem: Skin Integrity: Goal: Risk for impaired skin integrity will decrease Outcome: Progressing   Problem: Education: Goal: Ability to describe self-care measures that may prevent or decrease complications (Diabetes Survival Skills Education) will improve Outcome: Progressing   Problem: Coping: Goal: Ability to adjust to condition or change in health will improve Outcome: Progressing   Problem: Fluid Volume: Goal: Ability to maintain a balanced intake and output will  improve Outcome: Progressing   Problem: Health Behavior/Discharge Planning: Goal: Ability to identify and utilize available resources and services will improve Outcome: Progressing Goal: Ability to manage health-related needs will improve Outcome: Progressing   Problem: Metabolic: Goal: Ability to maintain appropriate glucose levels will improve Outcome: Progressing   Problem: Nutritional: Goal: Maintenance of adequate nutrition will improve Outcome: Progressing Goal: Progress toward achieving an optimal weight will improve Outcome: Progressing   Problem: Skin Integrity: Goal: Risk for impaired skin integrity will decrease Outcome: Progressing   Problem: Tissue Perfusion: Goal: Adequacy of tissue perfusion will improve Outcome: Progressing   Problem: Education: Goal: Knowledge of the prescribed therapeutic regimen will improve Outcome: Progressing Goal: Understanding of discharge needs will improve Outcome: Progressing Goal: Individualized Educational Video(s) Outcome: Progressing   Problem: Activity: Goal: Ability to avoid complications of mobility impairment will improve Outcome: Progressing Goal: Ability to tolerate increased activity will improve Outcome: Progressing   Problem: Clinical Measurements: Goal: Postoperative complications will be avoided or minimized Outcome: Progressing   Problem: Pain Management: Goal: Pain level will decrease with appropriate interventions Outcome: Progressing   Problem: Skin Integrity: Goal: Will show signs of wound healing Outcome: Progressing

## 2024-01-01 NOTE — TOC Transition Note (Signed)
 Transition of Care [facility]) - Discharge Note   Patient Details  Name: Colin Mcfarland MRN: [id number] Date of Birth: 1965-02-16  Transition of [facility]) CM/SW Contact:  Alfonse JONELLE Rex, RN Phone Number: 01/01/2024, 9:34 AM   Clinical Narrative:   Admitted for scheduled R THA on 12/31/23, dc therapy HEP, RW delivered to bedside by [employer]. No INPT CM needs     Final next level of care: Home/Self Care Barriers to Discharge: No Barriers Identified   Patient Goals and CMS Choice Patient states their goals for this hospitalization and ongoing recovery are:: return home          Discharge Placement                       Discharge Plan and Services Additional resources added to the After Visit Summary for                                       Social Drivers of Health (SDOH) Interventions SDOH Screenings   Food Insecurity: No Food Insecurity (01/01/2024)  Housing: Low Risk (01/01/2024)  Transportation Needs: No Transportation Needs (01/01/2024)  Utilities: Not At Risk (01/01/2024)  Financial Resource Strain: Low Risk  (11/14/2023)   Received from [facility]  Tobacco Use: Low Risk (12/31/2023)     Readmission Risk Interventions     No data to display

## 2024-01-01 NOTE — Evaluation (Signed)
 Physical Therapy Evaluation Patient Details Name: Colin Mcfarland MRN: [id number] DOB: [birth year] Today's Date: 01/01/2024  History of Present Illness  58 y.o. male admitted 12/31/23 for R DA-THA. PMH: kidney transplant, CKD, HTN, OA, sleep apnea  Clinical Impression  Pt is mobilizing well. He ambulated 140' with RW, no loss of balance. Stair training completed. Pt demonstrates good understanding of HEP. Pt's wife present for session. Handout of HEP issued. He is ready to DC home from a PT standpoint.         If plan is discharge home, recommend the following: A lot of help with bathing/dressing/bathroom;Assist for transportation;Help with stairs or ramp for entrance   Can travel by private vehicle        Equipment Recommendations Rolling walker (2 wheels)  Recommendations for Other Services       Functional Status Assessment Patient has had a recent decline in their functional status and demonstrates the ability to make significant improvements in function in a reasonable and predictable amount of time.     Precautions / Restrictions Precautions Precautions: Fall Recall of Precautions/Restrictions: Intact Restrictions Weight Bearing Restrictions Per Provider Order: No RLE Weight Bearing Per Provider Order: Weight bearing as tolerated      Mobility  Bed Mobility Overal bed mobility: Modified Independent             General bed mobility comments: HOB up, used rail and gait belt as leg lifter    Transfers Overall transfer level: Needs assistance Equipment used: Rolling walker (2 wheels) Transfers: Sit to/from Stand Sit to Stand: Supervision           General transfer comment: VCs hand placement    Ambulation/Gait Ambulation/Gait assistance: Supervision Gait Distance (Feet): 140 Feet Assistive device: Rolling walker (2 wheels) Gait Pattern/deviations: Step-to pattern, Decreased weight shift to right Gait velocity: decr     General Gait Details: VCs  sequencing and positioning in RW  Stairs Stairs: Yes Stairs assistance: Contact guard assist Stair Management: Step to pattern, Forwards, One rail Left, With cane Number of Stairs: 4 General stair comments: wife present, VCs sequencing  Wheelchair Mobility     Tilt Bed    Modified Rankin (Stroke Patients Only)       Balance Overall balance assessment: Modified Independent                                           Pertinent Vitals/Pain Pain Assessment Pain Assessment: 0-10 Pain Score: 5  Pain Location: R hip Pain Descriptors / Indicators: Operative site guarding, Sore Pain Intervention(s): Limited activity within patient's tolerance, Monitored during session, Premedicated before session, Ice applied, Repositioned    Home Living Family/patient expects to be discharged to:: Private residence Living Arrangements: Spouse/significant other Available Help at Discharge: Family;Available 24 hours/day Type of Home: House Home Access: Stairs to enter Entrance Stairs-Rails: Left Entrance Stairs-Number of Steps: 2 Alternate Level Stairs-Number of Steps: 12 Home Layout: Two level Home Equipment: Cane - single point      Prior Function Prior Level of Function : Independent/Modified Independent             Mobility Comments: walked with SPC, denies falls in past 6 months; works as sport and exercise psychologist for [employer] ADLs Comments: independent     Extremity/Trunk Assessment   Upper Extremity Assessment Upper Extremity Assessment: Overall WFL for tasks assessed    Lower Extremity Assessment Lower Extremity  Assessment: RLE deficits/detail RLE Deficits / Details: knee ext AROM -10*, pt reports at baseline he can fully extend knee but is now limited 2* pain at hip; hip flexion AAROM ~40*, abduction ~15* RLE Sensation: WNL RLE Coordination: WNL    Cervical / Trunk Assessment Cervical / Trunk Assessment: Normal  Communication    Communication Communication: No apparent difficulties    Cognition Arousal: Alert Behavior During Therapy: WFL for tasks assessed/performed   PT - Cognitive impairments: No apparent impairments                         Following commands: Intact       Cueing Cueing Techniques: Verbal cues     General Comments      Exercises Total Joint Exercises Ankle Circles/Pumps: AROM, Both, 10 reps, Supine Quad Sets: AROM, Both, 5 reps, Supine Short Arc Quad: AROM, Right, 5 reps, Supine Heel Slides: AAROM, Right, 5 reps, Supine Hip ABduction/ADduction: AAROM, Right, 5 reps, Supine Long Arc Quad: AROM, Right, 5 reps, Seated   Assessment/Plan    PT Assessment Patient does not need any further PT services  PT Problem List         PT Treatment Interventions      PT Goals (Current goals can be found in the Care Plan section)  Acute Rehab PT Goals Patient Stated Goal: return to work as sport and exercise psychologist, travel PT Goal Formulation: All assessment and education complete, DC therapy    Frequency       Co-evaluation               AM-PAC PT 6 Clicks Mobility  Outcome Measure Help needed turning from your back to your side while in a flat bed without using bedrails?: None Help needed moving from lying on your back to sitting on the side of a flat bed without using bedrails?: None Help needed moving to and from a bed to a chair (including a wheelchair)?: None Help needed standing up from a chair using your arms (e.g., wheelchair or bedside chair)?: None Help needed to walk in hospital room?: None Help needed climbing 3-5 steps with a railing? : A Little 6 Click Score: 23    End of Session Equipment Utilized During Treatment: Gait belt Activity Tolerance: Patient tolerated treatment well Patient left: in chair;with call bell/phone within reach;with family/visitor present;with nursing/sitter in room Nurse Communication: Mobility status      Time: 9046-8965 PT  Time Calculation (min) (ACUTE ONLY): 41 min   Charges:   PT Evaluation $PT Eval Moderate Complexity: 1 Mod PT Treatments $Gait Training: 8-22 mins $Therapeutic Exercise: 8-22 mins PT General Charges $$ ACUTE PT VISIT: 1 Visit        Sylvan Delon Copp PT 01/01/2024  Acute Rehabilitation Services  Office [phone]

## 2024-01-02 ENCOUNTER — Encounter (HOSPITAL_COMMUNITY): Payer: Self-pay | Admitting: *Deleted

## 2024-01-02 DIAGNOSIS — M1611 Unilateral primary osteoarthritis, right hip: Secondary | ICD-10-CM | POA: Diagnosis not present

## 2024-01-02 LAB — CBC
HCT: 26.7 % — ABNORMAL LOW (ref 39.0–52.0)
Hemoglobin: 9.3 g/dL — ABNORMAL LOW (ref 13.0–17.0)
MCH: 29.4 pg (ref 26.0–34.0)
MCHC: 34.8 g/dL (ref 30.0–36.0)
MCV: 84.5 fL (ref 80.0–100.0)
Platelets: 163 K/uL (ref 150–400)
RBC: 3.16 MIL/uL — ABNORMAL LOW (ref 4.22–5.81)
RDW: 12.8 % (ref 11.5–15.5)
WBC: 8.3 K/uL (ref 4.0–10.5)
nRBC: 0 % (ref 0.0–0.2)

## 2024-01-02 LAB — BASIC METABOLIC PANEL WITH GFR
Anion gap: 9 (ref 5–15)
BUN: 12 mg/dL (ref 6–20)
CO2: 23 mmol/L (ref 22–32)
Calcium: 9.2 mg/dL (ref 8.9–10.3)
Chloride: 96 mmol/L — ABNORMAL LOW (ref 98–111)
Creatinine, Ser: 0.63 mg/dL (ref 0.61–1.24)
GFR, Estimated: >60 mL/min
Glucose, Bld: 175 mg/dL — ABNORMAL HIGH (ref 70–99)
Potassium: 4.4 mmol/L (ref 3.5–5.1)
Sodium: 129 mmol/L — ABNORMAL LOW (ref 135–145)

## 2024-01-02 LAB — GLUCOSE, CAPILLARY: Glucose-Capillary: 168 mg/dL — ABNORMAL HIGH (ref 70–99)

## 2024-01-02 MED ORDER — ROSUVASTATIN CALCIUM 5 MG PO TABS: 5.0000 mg | ORAL_TABLET | Freq: Every day | ORAL | Status: DC

## 2024-01-02 MED ORDER — HYDROCODONE-ACETAMINOPHEN 5-325 MG PO TABS
1.0000 | ORAL_TABLET | ORAL | 0 refills | Status: AC | PRN
  Filled 2024-01-02: qty 42, 7d supply, fill #0

## 2024-01-02 MED ORDER — LISINOPRIL 5 MG PO TABS
5.0000 mg | ORAL_TABLET | Freq: Every day | ORAL | Status: DC
  Administered 2024-01-02: 5 mg via ORAL
  Filled 2024-01-02: qty 1

## 2024-01-02 MED ORDER — MUPIROCIN 2 % EX OINT
1.0000 | TOPICAL_OINTMENT | Freq: Two times a day (BID) | CUTANEOUS | 0 refills | Status: AC
  Filled 2024-01-02: qty 22, 11d supply, fill #0

## 2024-01-02 MED ORDER — DOCUSATE SODIUM 100 MG PO CAPS
100.0000 mg | ORAL_CAPSULE | Freq: Two times a day (BID) | ORAL | 0 refills | Status: AC
  Filled 2024-01-02: qty 60, 30d supply, fill #0

## 2024-01-02 MED ORDER — ASPIRIN 81 MG PO CHEW
81.0000 mg | CHEWABLE_TABLET | Freq: Two times a day (BID) | ORAL | 0 refills | Status: AC
  Filled 2024-01-02: qty 90, 45d supply, fill #0

## 2024-01-02 MED ORDER — POLYETHYLENE GLYCOL 3350 17 GM/SCOOP PO POWD
17.0000 g | Freq: Every day | ORAL | 0 refills | Status: AC | PRN
  Filled 2024-01-02: qty 238, 14d supply, fill #0

## 2024-01-02 MED ORDER — SENNA 8.6 MG PO TABS
2.0000 | ORAL_TABLET | Freq: Every day | ORAL | 0 refills | Status: AC
  Filled 2024-01-02: qty 30, 15d supply, fill #0

## 2024-01-02 MED ORDER — CHLORHEXIDINE GLUCONATE 4 % EX SOLN
1.0000 | CUTANEOUS | 1 refills | Status: AC
  Filled 2024-01-02: qty 946, 25d supply, fill #0

## 2024-01-02 MED ORDER — ONDANSETRON HCL 4 MG PO TABS
4.0000 mg | ORAL_TABLET | Freq: Three times a day (TID) | ORAL | 0 refills | Status: AC | PRN
  Filled 2024-01-02: qty 30, 10d supply, fill #0

## 2024-01-02 NOTE — Plan of Care (Signed)
" °  Problem: Education: Goal: Individualized Educational Video(s) Outcome: Progressing   Problem: Education: Goal: Knowledge of General Education information will improve Description: Including pain rating scale, medication(s)/side effects and non-pharmacologic comfort measures Outcome: Progressing   Problem: Health Behavior/Discharge Planning: Goal: Ability to manage health-related needs will improve Outcome: Progressing   Problem: Clinical Measurements: Goal: Ability to maintain clinical measurements within normal limits will improve Outcome: Progressing Goal: Will remain free from infection Outcome: Progressing Goal: Diagnostic test results will improve Outcome: Progressing Goal: Respiratory complications will improve Outcome: Progressing Goal: Cardiovascular complication will be avoided Outcome: Progressing   Problem: Activity: Goal: Risk for activity intolerance will decrease Outcome: Progressing   Problem: Nutrition: Goal: Adequate nutrition will be maintained Outcome: Progressing   Problem: Coping: Goal: Level of anxiety will decrease Outcome: Progressing   Problem: Elimination: Goal: Will not experience complications related to bowel motility Outcome: Progressing Goal: Will not experience complications related to urinary retention Outcome: Progressing   Problem: Pain Managment: Goal: General experience of comfort will improve and/or be controlled Outcome: Progressing   Problem: Safety: Goal: Ability to remain free from injury will improve Outcome: Progressing   Problem: Skin Integrity: Goal: Risk for impaired skin integrity will decrease Outcome: Progressing   Problem: Education: Goal: Ability to describe self-care measures that may prevent or decrease complications (Diabetes Survival Skills Education) will improve Outcome: Progressing Goal: Individualized Educational Video(s) Outcome: Progressing   Problem: Coping: Goal: Ability to adjust to  condition or change in health will improve Outcome: Progressing   Problem: Fluid Volume: Goal: Ability to maintain a balanced intake and output will improve Outcome: Progressing   Problem: Health Behavior/Discharge Planning: Goal: Ability to identify and utilize available resources and services will improve Outcome: Progressing Goal: Ability to manage health-related needs will improve Outcome: Progressing   Problem: Metabolic: Goal: Ability to maintain appropriate glucose levels will improve Outcome: Progressing   Problem: Nutritional: Goal: Maintenance of adequate nutrition will improve Outcome: Progressing Goal: Progress toward achieving an optimal weight will improve Outcome: Progressing   Problem: Skin Integrity: Goal: Risk for impaired skin integrity will decrease Outcome: Progressing   Problem: Tissue Perfusion: Goal: Adequacy of tissue perfusion will improve Outcome: Progressing   Problem: Education: Goal: Knowledge of the prescribed therapeutic regimen will improve Outcome: Progressing Goal: Understanding of discharge needs will improve Outcome: Progressing Goal: Individualized Educational Video(s) Outcome: Progressing   Problem: Activity: Goal: Ability to avoid complications of mobility impairment will improve Outcome: Progressing Goal: Ability to tolerate increased activity will improve Outcome: Progressing   Problem: Clinical Measurements: Goal: Postoperative complications will be avoided or minimized Outcome: Progressing   Problem: Pain Management: Goal: Pain level will decrease with appropriate interventions Outcome: Progressing   Problem: Skin Integrity: Goal: Will show signs of wound healing Outcome: Progressing   "

## 2024-01-02 NOTE — Progress Notes (Signed)
 Discharge meds in a secure bag delivered by this RN.

## 2024-01-02 NOTE — Progress Notes (Addendum)
" ° ° °  Subjective:  Patient reports pain as mild to moderate.  Denies N/V/CP/SOB/Abd pain. Denies tingling or numbness in LE bilaterally.  Vomiting reported overnight. No nausea or vomiting today. Was able to eat breakfast.  Voiding without difficulty.  Up with PT today.   Objective:   VITALS:   Vitals:   01/01/24 1300 01/01/24 1410 01/01/24 2108 01/02/24 0445  BP: (!) 160/76 (!) 170/72 (!) 141/78 (!) 156/74  Pulse:   92 93  Resp:   16 16  Temp:   98.9 F (37.2 C) 98 F (36.7 C)  TempSrc:   Oral Oral  SpO2:   99% 99%  Weight:      Height:        NAD Neurologically intact ABD soft Neurovascular intact Sensation intact distally Intact pulses distally Dorsiflexion/Plantar flexion intact Incision: dressing C/D/I No cellulitis present Compartment soft   Lab Results  Component Value Date   WBC 8.3 01/02/2024   HGB 9.3 (L) 01/02/2024   HCT 26.7 (L) 01/02/2024   MCV 84.5 01/02/2024   PLT 163 01/02/2024   BMET    Component Value Date/Time   NA 126 (L) 01/01/2024 1723   K 3.9 01/01/2024 1723   CL 95 (L) 01/01/2024 1723   CO2 22 01/01/2024 1723   GLUCOSE 203 (H) 01/01/2024 1723   BUN 11 01/01/2024 1723   CREATININE 0.60 (L) 01/01/2024 1723   CREATININE 9.49 (H) 12/15/2015 0836   CALCIUM  9.0 01/01/2024 1723   GFRNONAA >60 01/01/2024 1723   GFRNONAA 6 (L) 12/15/2015 0836     Assessment/Plan: 2 Days Post-Op   Principal Problem:   Osteoarthritis of right hip  ABLA. Hemoglobin 9.3 Continue to monitor.   Chronic hyponatremia. 126 sodium yesterday. Fluids restricted. Continue to restrict. Recheck BMP today.   WBAT with walker DVT ppx: Aspirin , SCDs, TEDS PO pain control PT/OT: 140 feet with PT yesterday, cleared. Continue today.  Dispo:  - Recheck BMP if trending upwards can d/c home with HEP. Continue sodium regimen at home. Follow-up with nephrology for continued monitoring.    ADDEND: Sodium trending upwards, discharge home. Keep scheduled follow-up with  nephrology for low sodium, continue fluid restriction and electrolytes per nephrology recommendations.    Valery GORMAN Potters 01/02/2024, 8:53 AM   [facility]  [place] [address], [place], [place] [address] Phone: [phone] [email or web address] Facebook  [employer]       "

## 2024-01-02 NOTE — Progress Notes (Signed)
 IV removed, dressed, belongings packed, instructions reviewed with understanding verbalized, transferred to front entrance via wheelchair.

## 2024-01-02 NOTE — Plan of Care (Signed)

## 2024-01-02 NOTE — Discharge Summary (Signed)
 " Physician Discharge Summary  Patient ID: Colin Mcfarland MRN: [id number] DOB/AGE: November 17, 1965 58 y.o.  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 male who was admitted 12/31/2023 with a diagnosis of Osteoarthritis of right hip and went to the operating room on 12/31/2023 and underwent the above named procedures.    He was given perioperative antibiotics:  Anti-infectives (From admission, onward)    Start     Dose/Rate Route Frequency Ordered Stop   12/31/23 1930  ceFAZolin  (ANCEF ) IVPB 2g/100 mL premix        2 g 200 mL/hr over 30 Minutes Intravenous Every 6 hours 12/31/23 1826 01/02/24 0809   12/31/23 1507  vancomycin  (VANCOCIN ) powder  Status:  Discontinued          As needed 12/31/23 1507 12/31/23 1536   12/31/23 0915  ceFAZolin  (ANCEF ) IVPB 2g/100 mL premix        2 g 200 mL/hr over 30 Minutes Intravenous On call to O.R. 12/31/23 0908 12/31/23 1342       He was given sequential compression devices, early ambulation, and aspirin  for DVT prophylaxis.  POD#1 Patient ambulated well with PT 140 feet. Hx chronic hyponatremia, sodium 128 and 126, fluids stopped, sodium tablets  given.  POD#2 Sodium improved, continue fluid restrictions and electrolytes per nephrology. Follow-up in 2 weeks for repeat evaluation.   He benefited maximally from the hospital stay and there were no complications.    Recent vital signs:  Vitals:   01/01/24 2108 01/02/24 0445  BP: (!) 141/78 (!) 156/74  Pulse: 92 93  Resp: 16 16  Temp: 98.9 F (37.2 C) 98 F (36.7 C)  SpO2: 99% 99%    Recent laboratory studies:  Lab Results  Component Value Date   HGB 9.3 (L) 01/02/2024   HGB 11.0 (L) 01/01/2024   HGB 13.8 12/22/2023   Lab Results  Component Value Date   WBC 8.3 01/02/2024   PLT 163 01/02/2024   No results found for: INR Lab Results  Component Value Date   NA 129 (L) 01/02/2024   K 4.4 01/02/2024   CL 96 (L) 01/02/2024   CO2 23 01/02/2024   BUN 12 01/02/2024   CREATININE 0.63 01/02/2024   GLUCOSE 175 (H) 01/02/2024     Allergies as of 01/02/2024       Reactions   Sulfa Antibiotics Anaphylaxis, Rash   As a child and was never to take   Blue Dyes (parenteral) Hives   Escitalopram    Other Reaction(s): GI side effects, Other (See Comments) Constipation, Hypertension   Jardiance [empagliflozin]  Metformin Dermatitis   Nsaids Other (See Comments)   Olmesartan    Other Reaction(s): Musculoskeletal stiffness, Other (See Comments) Join stiffness   Pravastatin    Uloric [febuxostat] Other (See Comments)   Joints stiffened   Alatrofloxacin Rash   Other Reaction(s): Unknown   Paricalcitol Rash   Other Reaction(s): Skin Rash, Unknown Zemphlar   Sevelamer Rash   Shellfish Allergy Rash        Medication List     STOP taking these medications    aspirin  EC 81 MG tablet Replaced by: aspirin  81 MG chewable tablet   traMADol  50 MG tablet Commonly known as: Ultram        TAKE these medications    acetaminophen  500 MG tablet Commonly known as: TYLENOL  Take 500 mg by mouth 3 (three) times daily as needed for mild pain (pain score 1-3) or  moderate pain (pain score 4-6) (Body pain).   ALPRAZolam  0.25 MG tablet Commonly known as: XANAX  Take 0.25 mg by mouth once a week.   amoxicillin 500 MG capsule Commonly known as: AMOXIL Take 500 mg by mouth as needed. With Dental procedures   aspirin  81 MG chewable tablet Commonly known as: Aspirin  Childrens Chew 1 tablet (81 mg total) by mouth 2 (two) times daily with a meal. Replaces: aspirin  EC 81 MG tablet   benzonatate  200 MG capsule Commonly known as: TESSALON  Take 200 mg by mouth daily.   chlorhexidine  4 % external liquid Commonly known as: HIBICLENS  Apply 15 mLs (1 Application total) topically as directed for 30 doses. Use as directed daily for 5 days every other week for 6 weeks.   clobetasol ointment 0.05 % Commonly known as: TEMOVATE Apply 1 Application topically daily as needed (Eczema).   docusate sodium  100 MG capsule Commonly known as: Colace Take 1 capsule (100 mg total) by mouth 2 (two) times daily.   HYDROcodone -acetaminophen  5-325 MG tablet Commonly known as: NORCO/VICODIN Take 1 tablet by mouth every 4 (four) hours as needed for up to 7 days for moderate pain (pain score 4-6) or severe pain (pain score 7-10).   insulin  lispro 100 UNIT/ML KwikPen Commonly known as: HUMALOG Inject 40 Units into the skin See admin instructions. INJECT UNDER THE SKIN ACCORDING TO SLIDING SCALE FOR HIGH SUGAR. MAX DAILY DOSE: 40 UNITS   Insulin  Syringe-Needle U-100 31G X 15/64 0.3 ML Misc Use 1 Syringe 3 (three) times daily before meals   lansoprazole  30 MG capsule Commonly known as: PREVACID  Take 1 capsule (30 mg total) by mouth daily at 12 noon. Call [phone] for an office visit for more refills What changed:  when to take this reasons to take this   Lantus  SoloStar 100 UNIT/ML Solostar Pen Generic drug: insulin  glargine Inject 5 Units into the skin daily. You can increase by 1 unit every 3-4 days to target a morning blood sugar 80-120   levothyroxine 137  MCG tablet Commonly known as: SYNTHROID Take 137 mcg by mouth daily.   lisinopril 5 MG tablet Commonly known as: ZESTRIL Take 5 mg by mouth daily.   melatonin 5 MG Tabs Take 1 mg by mouth at bedtime.   mupirocin  ointment 2 % Commonly known as: BACTROBAN  Place 1 Application into the nose 2 (two) times daily for 60 doses. Use as directed 2 times daily for 5 days every other week for 6 weeks.   mycophenolate  250 MG capsule Commonly known as: CELLCEPT  Take 1,000 mg by mouth 2 (two) times daily.   ondansetron  4 MG tablet  Commonly known as: Zofran  Take 1 tablet (4 mg total) by mouth every 8 (eight) hours as needed for nausea or vomiting.   OneTouch Delica Plus Lancet33G Misc   OneTouch Verio test strip Generic drug: glucose blood   OneTouch Verio w/Device Kit   polyethylene glycol 17 g packet Commonly known as: MIRALAX  / GLYCOLAX  Take 17 g by mouth daily. What changed: Another medication with the same name was added. Make sure you understand how and when to take each.   polyethylene glycol 17 g packet Commonly known as: MiraLax  Take 17 g by mouth daily as needed for mild constipation or moderate constipation. What changed: You were already taking a medication with the same name, and this prescription was added. Make sure you understand how and when to take each.   predniSONE  5 MG tablet Commonly known as: DELTASONE  Take 4 tablets x2 days, 3 tablets x2 days, 2 tablets x2 days, 1 tablet x2 days. What changed:  how much to take how to take this when to take this additional instructions   rosuvastatin 5 MG tablet Commonly known as: CRESTOR Take 5 mg by mouth daily.   senna 8.6 MG Tabs tablet Commonly known as: SENOKOT Take 2 tablets (17.2 mg total) by mouth at bedtime for 15 days.   sitaGLIPtin 100 MG tablet Commonly known as: JANUVIA Take 100 mg by mouth daily.   tacrolimus  0.5 MG capsule Commonly known as: PROGRAF  Take 1 mg by mouth 2 (two) times daily.    tamsulosin  0.4 MG Caps capsule Commonly known as: FLOMAX  TAKE 1 CAPSULE BY MOUTH EVERY  NIGHT AT BEDTIME   Vitamin D  50 MCG (2000 UT) Caps Take 1 capsule by mouth daily.               Discharge Care Instructions  (From admission, onward)           Start     Ordered   01/02/24 0000  Weight bearing as tolerated        01/02/24 1039   01/02/24 0000  Change dressing       Comments: Do not change your dressing.   01/02/24 1039              WEIGHT BEARING   Weight bearing as tolerated with assist device (walker, cane, etc) as directed, use it as long as suggested by your surgeon or therapist, typically at least 4-6 weeks.   EXERCISES  Results after joint replacement surgery are often greatly improved when you follow the exercise, range of motion and muscle strengthening exercises prescribed by your doctor. Safety measures are also important to protect the joint from further injury. Any time any of these exercises cause you to have increased pain or swelling, decrease what you are doing until you are comfortable again and then slowly increase them. If you have problems or questions, call your caregiver or physical therapist for advice.   Rehabilitation is important following a joint replacement. After just a few days of immobilization, the muscles of the leg can become weakened and shrink (atrophy).  These exercises are designed to build up the tone and strength of the thigh and leg muscles and to improve motion. Often times heat used for twenty to thirty minutes before working out will loosen up your tissues and help with improving the range of motion but do not use heat for the first two weeks following surgery (sometimes heat can increase post-operative swelling).   These exercises can be done on a  training (exercise) mat, on the floor, on a table or on a bed. Use whatever works the best and is most comfortable for you.    Use music or television while you are exercising  so that the exercises are a pleasant break in your day. This will make your life better with the exercises acting as a break in your routine that you can look forward to.   Perform all exercises about fifteen times, three times per day or as directed.  You should exercise both the operative leg and the other leg as well.  Exercises include:   Quad Sets - Tighten up the muscle on the front of the thigh (Quad) and hold for 5-10 seconds.   Straight Leg Raises - With your knee straight (if you were given a brace, keep it on), lift the leg to 60 degrees, hold for 3 seconds, and slowly lower the leg.  Perform this exercise against resistance later as your leg gets stronger.  Leg Slides: Lying on your back, slowly slide your foot toward your buttocks, bending your knee up off the floor (only go as far as is comfortable). Then slowly slide your foot back down until your leg is flat on the floor again.  Angel Wings: Lying on your back spread your legs to the side as far apart as you can without causing discomfort.  Hamstring Strength:  Lying on your back, push your heel against the floor with your leg straight by tightening up the muscles of your buttocks.  Repeat, but this time bend your knee to a comfortable angle, and push your heel against the floor.  You may put a pillow under the heel to make it more comfortable if necessary.   A rehabilitation program following joint replacement surgery can speed recovery and prevent re-injury in the future due to weakened muscles. Contact your doctor or a physical therapist for more information on knee rehabilitation.    CONSTIPATION  Constipation is defined medically as fewer than three stools per week and severe constipation as less than one stool per week.  Even if you have a regular bowel pattern at home, your normal regimen is likely to be disrupted due to multiple reasons following surgery.  Combination of anesthesia, postoperative narcotics, change in appetite  and fluid intake all can affect your bowels.   YOU MUST use at least one of the following options; they are listed in order of increasing strength to get the job done.  They are all available over the counter, and you may need to use some, POSSIBLY even all of these options:    Drink plenty of fluids (prune juice may be helpful) and high fiber foods Colace 100 mg by mouth twice a day  Senokot for constipation as directed and as needed Dulcolax (bisacodyl), take with full glass of water   Miralax  (polyethylene glycol) once or twice a day as needed.  If you have tried all these things and are unable to have a bowel movement in the first 3-4 days after surgery call either your surgeon or your primary doctor.    If you experience loose stools or diarrhea, hold the medications until you stool forms back up.  If your symptoms do not get better within 1 week or if they get worse, check with your doctor.  If you experience the worst abdominal pain ever or develop nausea or vomiting, please contact the office immediately for further recommendations for treatment.   ITCHING:  If you experience itching with  your medications, try taking only a single pain pill, or even half a pain pill at a time.  You can also use Benadryl  over the counter for itching or also to help with sleep.   TED HOSE STOCKINGS:  Use stockings on both legs until for at least 2 weeks or as directed by physician office. They may be removed at night for sleeping.  MEDICATIONS:  See your medication summary on the After Visit Summary that nursing will review with you.  You may have some home medications which will be placed on hold until you complete the course of blood thinner medication.  It is important for you to complete the blood thinner medication as prescribed.  PRECAUTIONS:  If you experience chest pain or shortness of breath - call 911 immediately for transfer to the hospital emergency department.   If you develop a fever  greater that 101 F, purulent drainage from wound, increased redness or drainage from wound, foul odor from the wound/dressing, or calf pain - CONTACT YOUR SURGEON.                                                   FOLLOW-UP APPOINTMENTS:  If you do not already have a post-op appointment, please call the office for an appointment to be seen by your surgeon.  Guidelines for how soon to be seen are listed in your After Visit Summary, but are typically between 1-4 weeks after surgery.  OTHER INSTRUCTIONS:   Knee Replacement:  Do not place pillow under knee, focus on keeping the knee straight while resting. CPM instructions: 0-90 degrees, 2 hours in the morning, 2 hours in the afternoon, and 2 hours in the evening. Place foam block, curve side up under heel at all times except when in CPM or when walking.  DO NOT modify, tear, cut, or change the foam block in any way.   MAKE SURE YOU:  Understand these instructions.  Get help right away if you are not doing well or get worse.    Thank you for letting us  be a part of your medical care team.  It is a privilege we respect greatly.  We hope these instructions will help you stay on track for a fast and full recovery!   Diagnostic Studies: DG HIP UNILAT WITH PELVIS 1V RIGHT Result Date: 12/31/2023 EXAM: 5 INTRAOPERATIVE FLUOROSCOPIC VIEWS XRAY OF THE RIGHT HIP 12/31/2023 04:08:00 PM COMPARISON: None available. CLINICAL HISTORY: Surgery, elective [id number]. FINDINGS: BONES AND JOINTS: Right hip total arthroplasty was placed in anatomic alignment. Fluoroscopy time 18 seconds. Fluoroscopy dose 2.23 microgray. SOFT TISSUES: The soft tissues are unremarkable. IMPRESSION: 1. Right hip total arthroplasty in anatomic alignment. Electronically signed by: Greig Pique MD 12/31/2023 05:02 PM EST RP Workstation: [id number]   DG Pelvis Portable Result Date: 12/31/2023 EXAM: 1 or 2 VIEW(S) XRAY OF THE PELVIS 12/31/2023 04:41:00 PM COMPARISON: None available. CLINICAL  HISTORY: Osteoarthritis of right hip FINDINGS: BONES AND JOINTS: Postoperative changes of right total hip arthroplasty. SOFT TISSUES: Skin staples over right hip. Subcutaneous gas. IMPRESSION: 1. Postoperative changes of right total hip arthroplasty with overlying skin staples and subcutaneous gas. Electronically signed by: Greig Pique MD 12/31/2023 04:53 PM EST RP Workstation: [id number]   DG C-Arm 1-60 Min-No Report Result Date: 12/31/2023 Fluoroscopy was utilized by the requesting physician.  No radiographic interpretation.  DG C-Arm 1-60 Min-No Report Result Date: 12/31/2023 Fluoroscopy was utilized by the requesting physician.  No radiographic interpretation.    Disposition: Discharge disposition: 01-Home or Self Care       Discharge Instructions     Call MD / Call 911   Complete by: As directed    If you experience chest pain or shortness of breath, CALL 911 and be transported to the hospital emergency room.  If you develope a fever above 101 F, pus (white drainage) or increased drainage or redness at the wound, or calf pain, call your surgeon's office.   Change dressing   Complete by: As directed    Do not change your dressing.   Constipation Prevention   Complete by: As directed    Drink plenty of fluids.  Prune juice may be helpful.  You may use a stool softener, such as Colace (over the counter) 100 mg twice a day.  Use MiraLax  (over the counter) for constipation as needed.   Diet Carb Modified   Complete by: As directed    Discharge instructions   Complete by: As directed    Elevate toes above nose. Use cryotherapy as needed for pain and swelling.   Driving restrictions   Complete by: As directed    No driving for 6 weeks   Increase activity slowly as tolerated   Complete by: As directed    Lifting restrictions   Complete by: As directed    No lifting for 6 weeks   Post-operative opioid taper instructions:   Complete by: As directed    POST-OPERATIVE OPIOID  TAPER INSTRUCTIONS: It is important to wean off of your opioid medication as soon as possible. If you do not need pain medication after your surgery it is ok to stop day one. Opioids include: Codeine, Hydrocodone (Norco, Vicodin), Oxycodone (Percocet, oxycontin ) and hydromorphone  amongst others.  Long term and even short term use of opiods can cause: Increased pain response Dependence Constipation Depression Respiratory depression And more.  Withdrawal symptoms can include Flu like symptoms Nausea, vomiting And more Techniques to manage these symptoms Hydrate well Eat regular healthy meals Stay active Use relaxation techniques(deep breathing, meditating, yoga) Do Not substitute Alcohol  to help with tapering If you have been on opioids for less than two weeks and do not have pain than it is ok to stop all together.  Plan to wean off of opioids This plan should start within one week post op of your joint replacement. Maintain the same interval or time between taking each dose and first decrease the dose.  Cut the total daily intake of opioids by one tablet each day Next start to increase the time between doses. The last dose that should be eliminated is the evening dose.      TED hose   Complete by: As directed    Use stockings (TED hose) for 2 weeks on both leg(s).  You may remove them at night for sleeping.   Weight bearing as tolerated   Complete by: As directed         Follow-up Information     Leigh Valery RAMAN, PA-C. Schedule an appointment as soon as possible for a visit in 2 week(s).   Specialty: Orthopedic Surgery Why: For suture removal, For wound re-check Contact information: [address], [address] [place] [address] [phone]                  Signed: Valery RAMAN Leigh 01/02/2024, 10:40 AM  "

## 2024-01-02 NOTE — Anesthesia Postprocedure Evaluation (Signed)
"   Anesthesia Post Note  Patient: Colin Mcfarland  Procedure(s) Performed: ARTHROPLASTY, HIP, TOTAL, ANTERIOR APPROACH (Right: Hip)     Patient location during evaluation: PACU Anesthesia Type: Spinal Level of consciousness: awake and alert Pain management: pain level controlled Vital Signs Assessment: post-procedure vital signs reviewed and stable Respiratory status: spontaneous breathing, nonlabored ventilation, respiratory function stable and patient connected to nasal cannula oxygen Cardiovascular status: blood pressure returned to baseline and stable Postop Assessment: no apparent nausea or vomiting Anesthetic complications: no   No notable events documented.  Last Vitals:  Vitals:   01/01/24 2108 01/02/24 0445  BP: (!) 141/78 (!) 156/74  Pulse: 92 93  Resp: 16 16  Temp: 37.2 C 36.7 C  SpO2: 99% 99%    Last Pain:  Vitals:   01/02/24 0800  TempSrc:   PainSc: Asleep                 Lynwood MARLA Cornea      "

## 2024-01-02 NOTE — Plan of Care (Signed)
" °  Problem: Education: Goal: Knowledge of General Education information will improve Description: Including pain rating scale, medication(s)/side effects and non-pharmacologic comfort measures 01/02/2024 0805 by Alaina Dozier PARAS, RN Outcome: Adequate for Discharge 01/02/2024 0805 by Alaina Dozier PARAS, RN Outcome: Progressing   Problem: Health Behavior/Discharge Planning: Goal: Ability to manage health-related needs will improve 01/02/2024 0805 by Alaina Dozier PARAS, RN Outcome: Adequate for Discharge 01/02/2024 0805 by Alaina Dozier PARAS, RN Outcome: Progressing   Problem: Clinical Measurements: Goal: Ability to maintain clinical measurements within normal limits will improve 01/02/2024 0805 by Alaina Dozier PARAS, RN Outcome: Adequate for Discharge 01/02/2024 0805 by Alaina Dozier PARAS, RN Outcome: Progressing Goal: Will remain free from infection 01/02/2024 0805 by Alaina Dozier PARAS, RN Outcome: Adequate for Discharge 01/02/2024 0805 by Alaina Dozier PARAS, RN Outcome: Progressing Goal: Diagnostic test results will improve Outcome: Adequate for Discharge Goal: Respiratory complications will improve Outcome: Adequate for Discharge Goal: Cardiovascular complication will be avoided Outcome: Adequate for Discharge   Problem: Activity: Goal: Risk for activity intolerance will decrease Outcome: Adequate for Discharge   Problem: Nutrition: Goal: Adequate nutrition will be maintained Outcome: Adequate for Discharge   Problem: Coping: Goal: Level of anxiety will decrease Outcome: Adequate for Discharge   Problem: Elimination: Goal: Will not experience complications related to bowel motility Outcome: Adequate for Discharge Goal: Will not experience complications related to urinary retention Outcome: Adequate for Discharge   Problem: Pain Managment: Goal: General experience of comfort will improve and/or be controlled Outcome: Adequate for  Discharge   Problem: Safety: Goal: Ability to remain free from injury will improve Outcome: Adequate for Discharge   Problem: Skin Integrity: Goal: Risk for impaired skin integrity will decrease Outcome: Adequate for Discharge   Problem: Education: Goal: Ability to describe self-care measures that may prevent or decrease complications (Diabetes Survival Skills Education) will improve Outcome: Adequate for Discharge Goal: Individualized Educational Video(s) Outcome: Adequate for Discharge   Problem: Coping: Goal: Ability to adjust to condition or change in health will improve Outcome: Adequate for Discharge   Problem: Fluid Volume: Goal: Ability to maintain a balanced intake and output will improve Outcome: Adequate for Discharge   Problem: Health Behavior/Discharge Planning: Goal: Ability to identify and utilize available resources and services will improve Outcome: Adequate for Discharge Goal: Ability to manage health-related needs will improve Outcome: Adequate for Discharge   Problem: Metabolic: Goal: Ability to maintain appropriate glucose levels will improve Outcome: Adequate for Discharge   Problem: Nutritional: Goal: Maintenance of adequate nutrition will improve Outcome: Adequate for Discharge Goal: Progress toward achieving an optimal weight will improve Outcome: Adequate for Discharge   Problem: Skin Integrity: Goal: Risk for impaired skin integrity will decrease Outcome: Adequate for Discharge   Problem: Tissue Perfusion: Goal: Adequacy of tissue perfusion will improve Outcome: Adequate for Discharge   Problem: Education: Goal: Knowledge of the prescribed therapeutic regimen will improve Outcome: Adequate for Discharge Goal: Understanding of discharge needs will improve Outcome: Adequate for Discharge Goal: Individualized Educational Video(s) Outcome: Adequate for Discharge   Problem: Activity: Goal: Ability to avoid complications of mobility  impairment will improve Outcome: Adequate for Discharge Goal: Ability to tolerate increased activity will improve Outcome: Adequate for Discharge   Problem: Clinical Measurements: Goal: Postoperative complications will be avoided or minimized Outcome: Adequate for Discharge   Problem: Pain Management: Goal: Pain level will decrease with appropriate interventions Outcome: Adequate for Discharge   Problem: Skin Integrity: Goal: Will show signs of wound healing Outcome: Adequate for Discharge   "

## 2024-01-29 LAB — LIPID PANEL
Chol/HDL Ratio: 4.9 ratio (ref 0.0–5.0)
Cholesterol, Total: 168 mg/dL (ref 100–199)
HDL: 34 mg/dL — ABNORMAL LOW
LDL Chol Calc (NIH): 74 mg/dL (ref 0–99)
Triglycerides: 374 mg/dL — ABNORMAL HIGH (ref 0–149)
VLDL Cholesterol Cal: 60 mg/dL — ABNORMAL HIGH (ref 5–40)

## 2024-07-21 ENCOUNTER — Other Ambulatory Visit (HOSPITAL_COMMUNITY)
